# Patient Record
Sex: Female | Born: 1990 | Race: Black or African American | Hispanic: No | Marital: Single | Smoking: Former smoker
Health system: Southern US, Community
[De-identification: ages and names within clinical notes are randomized; demographics above are authoritative.]

## PROBLEM LIST (undated history)

## (undated) ENCOUNTER — Inpatient Hospital Stay (HOSPITAL_COMMUNITY): Payer: Self-pay

## (undated) DIAGNOSIS — F329 Major depressive disorder, single episode, unspecified: Secondary | ICD-10-CM

## (undated) DIAGNOSIS — F32A Depression, unspecified: Secondary | ICD-10-CM

## (undated) DIAGNOSIS — R112 Nausea with vomiting, unspecified: Secondary | ICD-10-CM

## (undated) DIAGNOSIS — Z9889 Other specified postprocedural states: Secondary | ICD-10-CM

## (undated) DIAGNOSIS — M549 Dorsalgia, unspecified: Secondary | ICD-10-CM

## (undated) DIAGNOSIS — F419 Anxiety disorder, unspecified: Secondary | ICD-10-CM

## (undated) DIAGNOSIS — F99 Mental disorder, not otherwise specified: Secondary | ICD-10-CM

## (undated) DIAGNOSIS — B009 Herpesviral infection, unspecified: Secondary | ICD-10-CM

## (undated) DIAGNOSIS — D649 Anemia, unspecified: Secondary | ICD-10-CM

## (undated) DIAGNOSIS — J45909 Unspecified asthma, uncomplicated: Secondary | ICD-10-CM

## (undated) DIAGNOSIS — G43909 Migraine, unspecified, not intractable, without status migrainosus: Secondary | ICD-10-CM

## (undated) HISTORY — DX: Other specified postprocedural states: Z98.890

## (undated) HISTORY — DX: Herpesviral infection, unspecified: B00.9

## (undated) HISTORY — PX: NO PAST SURGERIES: SHX2092

## (undated) HISTORY — DX: Other specified postprocedural states: R11.2

---
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
## 2009-02-22 ENCOUNTER — Emergency Department (HOSPITAL_BASED_OUTPATIENT_CLINIC_OR_DEPARTMENT_OTHER): Admission: EM | Admit: 2009-02-22 | Discharge: 2009-02-22 | Payer: Self-pay | Admitting: Emergency Medicine

## 2009-04-21 ENCOUNTER — Emergency Department (HOSPITAL_BASED_OUTPATIENT_CLINIC_OR_DEPARTMENT_OTHER): Admission: EM | Admit: 2009-04-21 | Discharge: 2009-04-21 | Payer: Self-pay | Admitting: Emergency Medicine

## 2009-06-29 ENCOUNTER — Emergency Department (HOSPITAL_BASED_OUTPATIENT_CLINIC_OR_DEPARTMENT_OTHER): Admission: EM | Admit: 2009-06-29 | Discharge: 2009-06-29 | Payer: Self-pay | Admitting: Emergency Medicine

## 2010-03-31 ENCOUNTER — Emergency Department (HOSPITAL_BASED_OUTPATIENT_CLINIC_OR_DEPARTMENT_OTHER): Admission: EM | Admit: 2010-03-31 | Discharge: 2010-03-31 | Payer: Self-pay | Admitting: Emergency Medicine

## 2010-05-20 ENCOUNTER — Emergency Department (HOSPITAL_BASED_OUTPATIENT_CLINIC_OR_DEPARTMENT_OTHER): Admission: EM | Admit: 2010-05-20 | Discharge: 2010-05-20 | Payer: Self-pay | Admitting: Emergency Medicine

## 2010-09-27 ENCOUNTER — Encounter: Payer: Self-pay | Admitting: Obstetrics and Gynecology

## 2010-12-09 LAB — PREGNANCY, URINE: Preg Test, Ur: NEGATIVE

## 2010-12-09 LAB — URINALYSIS, ROUTINE W REFLEX MICROSCOPIC
Bilirubin Urine: NEGATIVE
Glucose, UA: NEGATIVE mg/dL
Hgb urine dipstick: NEGATIVE
Ketones, ur: NEGATIVE mg/dL
Nitrite: NEGATIVE
Protein, ur: NEGATIVE mg/dL
Specific Gravity, Urine: 1.017 (ref 1.005–1.030)
Urobilinogen, UA: 0.2 mg/dL (ref 0.0–1.0)
pH: 6 (ref 5.0–8.0)

## 2010-12-09 LAB — URINE CULTURE: Colony Count: >=100000

## 2010-12-09 LAB — URINE MICROSCOPIC-ADD ON

## 2010-12-11 LAB — URINALYSIS, ROUTINE W REFLEX MICROSCOPIC
Glucose, UA: NEGATIVE mg/dL
Ketones, ur: NEGATIVE mg/dL
Nitrite: NEGATIVE
Protein, ur: NEGATIVE mg/dL
Urobilinogen, UA: 0.2 mg/dL (ref 0.0–1.0)

## 2010-12-11 LAB — URINE CULTURE
Colony Count: NO GROWTH
Culture: NO GROWTH

## 2010-12-11 LAB — PREGNANCY, URINE: Preg Test, Ur: NEGATIVE

## 2010-12-11 LAB — URINE MICROSCOPIC-ADD ON

## 2011-03-24 ENCOUNTER — Emergency Department (HOSPITAL_BASED_OUTPATIENT_CLINIC_OR_DEPARTMENT_OTHER)
Admission: EM | Admit: 2011-03-24 | Discharge: 2011-03-24 | Disposition: A | Discharge status: Home / Self Care | Payer: Self-pay | Attending: Emergency Medicine | Admitting: Emergency Medicine

## 2011-03-24 DIAGNOSIS — G43909 Migraine, unspecified, not intractable, without status migrainosus: Secondary | ICD-10-CM | POA: Insufficient documentation

## 2011-03-24 DIAGNOSIS — H53149 Visual discomfort, unspecified: Secondary | ICD-10-CM | POA: Insufficient documentation

## 2011-03-24 HISTORY — DX: Migraine, unspecified, not intractable, without status migrainosus: G43.909

## 2011-03-24 MED ORDER — METOCLOPRAMIDE HCL 5 MG/ML IJ SOLN
10.0000 mg | Freq: Once | INTRAMUSCULAR | Status: AC
  Administered 2011-03-24: 10 mg via INTRAVENOUS
  Filled 2011-03-24: qty 2

## 2011-03-24 MED ORDER — SODIUM CHLORIDE 0.9 % IV SOLN: Freq: Once | INTRAVENOUS | Status: DC

## 2011-03-24 MED ORDER — DIPHENHYDRAMINE HCL 50 MG/ML IJ SOLN
25.0000 mg | Freq: Once | INTRAMUSCULAR | Status: AC
  Administered 2011-03-24: 25 mg via INTRAVENOUS
  Filled 2011-03-24: qty 1

## 2011-03-24 MED ORDER — DEXAMETHASONE SODIUM PHOSPHATE 10 MG/ML IJ SOLN
10.0000 mg | Freq: Once | INTRAMUSCULAR | Status: AC
  Administered 2011-03-24: 10 mg via INTRAVENOUS
  Filled 2011-03-24: qty 1

## 2011-03-24 NOTE — ED Notes (Signed)
Onset of headache last night unrelieved after taking Aleve Migraine.  Pain is described a severe, pressure on right side of head.  She has nausea and blurred vision in right eye. Denies head injury.

## 2011-03-24 NOTE — ED Notes (Signed)
MD at bedside. 

## 2011-03-24 NOTE — ED Provider Notes (Signed)
History     Chief Complaint  Patient presents with  . Headache  . Nausea   Patient is a 20 y.o. female presenting with migraine. The history is provided by the patient.  Migraine This is a recurrent problem. The current episode started yesterday. The problem occurs constantly. The problem has been gradually worsening. Associated symptoms include headaches. The symptoms are aggravated by stress (bright lights). The symptoms are relieved by NSAIDs. She has tried acetaminophen for the symptoms. The treatment provided no relief.  Pain over the right side of face which is usually where headaches are  Past Medical History  Diagnosis Date  . Migraines     No past surgical history on file.  No family history on file.  History  Substance Use Topics  . Smoking status: Former Games developer  . Smokeless tobacco: Not on file  . Alcohol Use: No    OB History    Grav Para Term Preterm Abortions TAB SAB Ect Mult Living                  Review of Systems  Neurological: Positive for headaches. Negative for dizziness, facial asymmetry, weakness and numbness.  All other systems reviewed and are negative.    Physical Exam  BP 105/58  Pulse 51  Temp(Src) 98.1 F (36.7 C) (Oral)  Resp 20  Ht 5\' 5"  (1.651 m)  Wt 119 lb (53.978 kg)  BMI 19.80 kg/m2  SpO2 100%  LMP 03/19/2011  Physical Exam  Nursing note and vitals reviewed. Constitutional: She is oriented to person, place, and time. She appears well-developed and well-nourished. She appears distressed.  HENT:  Head: Normocephalic and atraumatic.  Eyes: EOM are normal. Pupils are equal, round, and reactive to light.  Fundoscopic exam:      The right eye shows no papilledema.       The left eye shows no papilledema.  Neck: Normal range of motion. Neck supple.  Cardiovascular: Normal rate, regular rhythm, normal heart sounds and intact distal pulses.  Exam reveals no friction rub.   No murmur heard. Pulmonary/Chest: Effort normal and  breath sounds normal. She has no wheezes. She has no rales.  Abdominal: Soft. Bowel sounds are normal. She exhibits no distension. There is no tenderness. There is no rebound and no guarding.  Musculoskeletal: Normal range of motion. She exhibits no tenderness.       No edema  Lymphadenopathy:    She has no cervical adenopathy.  Neurological: She is alert and oriented to person, place, and time. She has normal strength. No cranial nerve deficit or sensory deficit. Gait normal.       photophobia  Skin: Skin is warm and dry. No rash noted.  Psychiatric: She has a normal mood and affect. Her behavior is normal.    ED Course  Procedures  MDM Pt with typical migraine HA without sx suggestive of SAH(sudden onset, worst of life, or deficits), infection, or cavernous vein thrombosis.  Normal neuro exam and vital signs. Will give HA cocktail and on re-eval pt feeling much better.       Gwyneth Sprout, MD 03/24/11 1559

## 2011-09-13 ENCOUNTER — Other Ambulatory Visit (HOSPITAL_COMMUNITY)
Admission: RE | Admit: 2011-09-13 | Discharge: 2011-09-13 | Disposition: A | Discharge status: Home / Self Care | Payer: BC Managed Care – PPO | Source: Ambulatory Visit | Attending: Obstetrics and Gynecology | Admitting: Obstetrics and Gynecology

## 2011-09-13 DIAGNOSIS — Z113 Encounter for screening for infections with a predominantly sexual mode of transmission: Secondary | ICD-10-CM | POA: Insufficient documentation

## 2011-09-13 DIAGNOSIS — Z124 Encounter for screening for malignant neoplasm of cervix: Secondary | ICD-10-CM | POA: Insufficient documentation

## 2012-06-09 ENCOUNTER — Emergency Department (HOSPITAL_BASED_OUTPATIENT_CLINIC_OR_DEPARTMENT_OTHER)
Admission: EM | Admit: 2012-06-09 | Discharge: 2012-06-10 | Disposition: A | Discharge status: Home / Self Care | Payer: Self-pay | Attending: Emergency Medicine | Admitting: Emergency Medicine

## 2012-06-09 ENCOUNTER — Encounter (HOSPITAL_BASED_OUTPATIENT_CLINIC_OR_DEPARTMENT_OTHER): Payer: Self-pay | Admitting: Emergency Medicine

## 2012-06-09 DIAGNOSIS — R109 Unspecified abdominal pain: Secondary | ICD-10-CM | POA: Insufficient documentation

## 2012-06-09 LAB — COMPREHENSIVE METABOLIC PANEL
Alkaline Phosphatase: 58 U/L (ref 39–117)
BUN: 8 mg/dL (ref 6–23)
CO2: 26 mEq/L (ref 19–32)
Chloride: 105 mEq/L (ref 96–112)
GFR calc Af Amer: >90 mL/min (ref >90)
Glucose, Bld: 82 mg/dL (ref 70–99)
Potassium: 4.3 mEq/L (ref 3.5–5.1)
Total Bilirubin: 0.2 mg/dL — ABNORMAL LOW (ref 0.3–1.2)

## 2012-06-09 LAB — URINE MICROSCOPIC-ADD ON

## 2012-06-09 LAB — URINALYSIS, ROUTINE W REFLEX MICROSCOPIC
Bilirubin Urine: NEGATIVE
Ketones, ur: NEGATIVE mg/dL
Nitrite: NEGATIVE
Protein, ur: NEGATIVE mg/dL
pH: 7 (ref 5.0–8.0)

## 2012-06-09 LAB — CBC WITH DIFFERENTIAL/PLATELET
Hemoglobin: 12.2 g/dL (ref 12.0–15.0)
Lymphs Abs: 2.5 10*3/uL (ref 0.7–4.0)
MCH: 27.2 pg (ref 26.0–34.0)
Monocytes Relative: 8 % (ref 3–12)
Neutro Abs: 1.4 10*3/uL — ABNORMAL LOW (ref 1.7–7.7)
Neutrophils Relative %: 30 % — ABNORMAL LOW (ref 43–77)
RBC: 4.48 MIL/uL (ref 3.87–5.11)

## 2012-06-09 LAB — PREGNANCY, URINE: Preg Test, Ur: NEGATIVE

## 2012-06-09 MED ORDER — HYDROMORPHONE HCL PF 1 MG/ML IJ SOLN
1.0000 mg | Freq: Once | INTRAMUSCULAR | Status: AC
  Administered 2012-06-09: 1 mg via INTRAVENOUS
  Filled 2012-06-09: qty 1

## 2012-06-09 MED ORDER — SODIUM CHLORIDE 0.9 % IV BOLUS (SEPSIS)
1000.0000 mL | Freq: Once | INTRAVENOUS | Status: AC
  Administered 2012-06-09: 1000 mL via INTRAVENOUS

## 2012-06-09 MED ORDER — SODIUM CHLORIDE 0.9 % IV SOLN
Freq: Once | INTRAVENOUS | Status: AC
  Administered 2012-06-09: via INTRAVENOUS

## 2012-06-09 MED ORDER — ONDANSETRON HCL 4 MG/2ML IJ SOLN
4.0000 mg | Freq: Once | INTRAMUSCULAR | Status: AC
  Administered 2012-06-09: 4 mg via INTRAVENOUS
  Filled 2012-06-09: qty 2

## 2012-06-09 NOTE — ED Notes (Signed)
Pt. C/o of increased abd pain. Rates 9/10 Dr. Preston Fleeting aware.

## 2012-06-09 NOTE — ED Notes (Signed)
abd pain   Was seen yesterday in [facility] for same

## 2012-06-09 NOTE — ED Provider Notes (Signed)
History   This chart was scribed for Dione Booze, MD by Toya Smothers. The patient was seen in room MH06/MH06. Patient's care was started at 2006.  CSN: [id number]  Arrival date & time 06/09/12  2006   First MD Initiated Contact with Patient 06/09/12 2201      Chief Complaint  Patient presents with  . Abdominal Pain   The history is provided by the patient. No language interpreter was used.    Julia Morgan is a 20 y.o. female who presents to the Emergency Department complaining of 2 days of sudden onset new severe waxing and waning lower abdominal pain. Pain is described as sharp, aggravated by gaiting, radiating to the back, and is ranked as 10/10. She reports being evaluated at an ED in [facility], and was told to return if pain worsened. Pt has been taking Hydrocodone with mild relief, stating pain subsided and returned. Pt denotes today is the last day of her menstrual cycle, and that her cycle has been 4 days late. Pt denies fever, chills, emesis, rash, and cough.   Past Medical History  Diagnosis Date  . Migraines     History reviewed. No pertinent past surgical history.  No family history on file.  History  Substance Use Topics  . Smoking status: Former Games developer  . Smokeless tobacco: Not on file  . Alcohol Use: No    Review of Systems  Gastrointestinal: Positive for nausea and abdominal pain.  Genitourinary: Negative for flank pain, decreased urine volume and difficulty urinating.  All other systems reviewed and are negative.    Allergies  Review of patient's allergies indicates no known allergies.  Home Medications  No current outpatient prescriptions on file.  BP 102/57  Pulse 61  Temp 98.5 F (36.9 C) (Oral)  Resp 22  SpO2 100%  Physical Exam  Nursing note and vitals reviewed. Constitutional: She is oriented to person, place, and time. She appears well-developed and well-nourished. No distress.       Appears to be in pain raithing on the bed.    HENT:  Head: Normocephalic and atraumatic.  Mouth/Throat: No oropharyngeal exudate.  Eyes: EOM are normal. Pupils are equal, round, and reactive to light. No scleral icterus.  Neck: Normal range of motion. Neck supple. No tracheal deviation present.  Pulmonary/Chest: Effort normal and breath sounds normal. No respiratory distress.  Abdominal: Soft.       Moderate LLQ. Mild CVA tenderness. Bowel sounds decresaed  Musculoskeletal: Normal range of motion. She exhibits no edema and no tenderness.  Lymphadenopathy:    She has no cervical adenopathy.  Neurological: She is alert and oriented to person, place, and time. Coordination normal.  Skin: Skin is warm and dry. No rash noted.  Psychiatric: She has a normal mood and affect. Her behavior is normal. Judgment and thought content normal.    ED Course  Procedures  DIAGNOSTIC STUDIES: Oxygen Saturation is 100% on room air, normal by my interpretation.    COORDINATION OF CARE: 22:27- Evaluated Pt. PT is awake, alert, and  22:31- Patient informed of clinical course, understand medical decision-making process, and agree with plan.      Results for orders placed during the hospital encounter of 06/09/12  PREGNANCY, URINE      Component Value Range   Preg Test, Ur NEGATIVE  NEGATIVE  URINALYSIS, ROUTINE W REFLEX MICROSCOPIC      Component Value Range   Color, Urine YELLOW  YELLOW   APPearance CLOUDY (*) CLEAR  Specific Gravity, Urine 1.020  1.005 - 1.030   pH 7.0  5.0 - 8.0   Glucose, UA NEGATIVE  NEGATIVE mg/dL   Hgb urine dipstick NEGATIVE  NEGATIVE   Bilirubin Urine NEGATIVE  NEGATIVE   Ketones, ur NEGATIVE  NEGATIVE mg/dL   Protein, ur NEGATIVE  NEGATIVE mg/dL   Urobilinogen, UA 1.0  0.0 - 1.0 mg/dL   Nitrite NEGATIVE  NEGATIVE   Leukocytes, UA SMALL (*) NEGATIVE  URINE MICROSCOPIC-ADD ON      Component Value Range   Squamous Epithelial / LPF FEW (*) RARE   WBC, UA 7-10  <3 WBC/hpf   RBC / HPF 0-2  <3 RBC/hpf    Bacteria, UA MANY (*) RARE  CBC WITH DIFFERENTIAL      Component Value Range   WBC 4.7  4.0 - 10.5 K/uL   RBC 4.48  3.87 - 5.11 MIL/uL   Hemoglobin 12.2  12.0 - 15.0 g/dL   HCT 16.1 (*) 09.6 - 04.5 %   MCV 74.3 (*) 78.0 - 100.0 fL   MCH 27.2  26.0 - 34.0 pg   MCHC 36.6 (*) 30.0 - 36.0 g/dL   RDW 40.9  81.1 - 91.4 %   Platelets 239  150 - 400 K/uL   Neutrophils Relative 30 (*) 43 - 77 %   Neutro Abs 1.4 (*) 1.7 - 7.7 K/uL   Lymphocytes Relative 54 (*) 12 - 46 %   Lymphs Abs 2.5  0.7 - 4.0 K/uL   Monocytes Relative 8  3 - 12 %   Monocytes Absolute 0.4  0.1 - 1.0 K/uL   Eosinophils Relative 9 (*) 0 - 5 %   Eosinophils Absolute 0.4  0.0 - 0.7 K/uL   Basophils Relative 0  0 - 1 %   Basophils Absolute 0.0  0.0 - 0.1 K/uL  COMPREHENSIVE METABOLIC PANEL      Component Value Range   Sodium 139  135 - 145 mEq/L   Potassium 4.3  3.5 - 5.1 mEq/L   Chloride 105  96 - 112 mEq/L   CO2 26  19 - 32 mEq/L   Glucose, Bld 82  70 - 99 mg/dL   BUN 8  6 - 23 mg/dL   Creatinine, Ser 7.82  0.50 - 1.10 mg/dL   Calcium 9.1  8.4 - 95.6 mg/dL   Total Protein 6.6  6.0 - 8.3 g/dL   Albumin 3.8  3.5 - 5.2 g/dL   AST 18  0 - 37 U/L   ALT 8  0 - 35 U/L   Alkaline Phosphatase 58  39 - 117 U/L   Total Bilirubin 0.2 (*) 0.3 - 1.2 mg/dL   GFR calc non Af Amer >90  >90 mL/min   GFR calc Af Amer >90  >90 mL/min    1. Abdominal pain       MDM  Abdominal pain of uncertain cause. Test results were obtained from her ED visit at [facility] yesterday. She had an essentially negative CT of her abdomen and pelvis, normal CBC with WBC of 5.1, and normal metabolic panel. Urinalysis was normal. Labs will be repeated today. No indication to repeat CT scan. She needs a CT of her pelvis for completeness. She'll be given IV fluid and IV narcotics in the emergency department.  Repeat lab testing is unremarkable. Pelvic ultrasound will be obtained tomorrow and she is sent home with prescription for Percocet. If  ultrasound is negative, she will need referral to GI  for further evaluation of her pain.    I personally performed the services described in this documentation, which was scribed in my presence. The recorded information has been reviewed and considered.       Dione Booze, MD 06/10/12 [phone]

## 2012-06-10 ENCOUNTER — Ambulatory Visit (HOSPITAL_BASED_OUTPATIENT_CLINIC_OR_DEPARTMENT_OTHER)
Admit: 2012-06-10 | Discharge: 2012-06-10 | Disposition: A | Discharge status: Home / Self Care | Payer: Self-pay | Attending: Emergency Medicine | Admitting: Emergency Medicine

## 2012-06-10 DIAGNOSIS — R1032 Left lower quadrant pain: Secondary | ICD-10-CM | POA: Insufficient documentation

## 2012-06-10 MED ORDER — OXYCODONE-ACETAMINOPHEN 5-325 MG PO TABS: 1.0000 | ORAL_TABLET | ORAL | Status: DC | PRN

## 2012-12-06 ENCOUNTER — Encounter (HOSPITAL_BASED_OUTPATIENT_CLINIC_OR_DEPARTMENT_OTHER): Payer: Self-pay

## 2012-12-06 ENCOUNTER — Emergency Department (HOSPITAL_BASED_OUTPATIENT_CLINIC_OR_DEPARTMENT_OTHER): Payer: No Typology Code available for payment source

## 2012-12-06 ENCOUNTER — Emergency Department (HOSPITAL_BASED_OUTPATIENT_CLINIC_OR_DEPARTMENT_OTHER)
Admission: EM | Admit: 2012-12-06 | Discharge: 2012-12-06 | Disposition: A | Discharge status: Home / Self Care | Payer: No Typology Code available for payment source | Attending: Emergency Medicine | Admitting: Emergency Medicine

## 2012-12-06 DIAGNOSIS — Z87891 Personal history of nicotine dependence: Secondary | ICD-10-CM | POA: Insufficient documentation

## 2012-12-06 DIAGNOSIS — S3981XA Other specified injuries of abdomen, initial encounter: Secondary | ICD-10-CM | POA: Insufficient documentation

## 2012-12-06 DIAGNOSIS — R109 Unspecified abdominal pain: Secondary | ICD-10-CM

## 2012-12-06 DIAGNOSIS — S79929A Unspecified injury of unspecified thigh, initial encounter: Secondary | ICD-10-CM | POA: Insufficient documentation

## 2012-12-06 DIAGNOSIS — S79919A Unspecified injury of unspecified hip, initial encounter: Secondary | ICD-10-CM | POA: Insufficient documentation

## 2012-12-06 DIAGNOSIS — Y9389 Activity, other specified: Secondary | ICD-10-CM | POA: Insufficient documentation

## 2012-12-06 DIAGNOSIS — Y92410 Unspecified street and highway as the place of occurrence of the external cause: Secondary | ICD-10-CM | POA: Insufficient documentation

## 2012-12-06 DIAGNOSIS — Z8679 Personal history of other diseases of the circulatory system: Secondary | ICD-10-CM | POA: Insufficient documentation

## 2012-12-06 LAB — CBC WITH DIFFERENTIAL/PLATELET
Basophils Absolute: 0 10*3/uL (ref 0.0–0.1)
Eosinophils Relative: 2 % (ref 0–5)
Lymphocytes Relative: 31 % (ref 12–46)
MCV: 75.2 fL — ABNORMAL LOW (ref 78.0–100.0)
Neutro Abs: 3.2 10*3/uL (ref 1.7–7.7)
Platelets: 263 10*3/uL (ref 150–400)
RDW: 12.1 % (ref 11.5–15.5)
WBC: 5.3 10*3/uL (ref 4.0–10.5)

## 2012-12-06 LAB — COMPREHENSIVE METABOLIC PANEL
ALT: 8 U/L (ref 0–35)
AST: 13 U/L (ref 0–37)
Albumin: 4.1 g/dL (ref 3.5–5.2)
CO2: 24 mEq/L (ref 19–32)
Calcium: 9.2 mg/dL (ref 8.4–10.5)
GFR calc non Af Amer: >90 mL/min (ref >90)
Sodium: 139 mEq/L (ref 135–145)

## 2012-12-06 MED ORDER — HYDROCODONE-ACETAMINOPHEN 5-325 MG PO TABS: 2.0000 | ORAL_TABLET | ORAL | Status: DC | PRN

## 2012-12-06 MED ORDER — IOHEXOL 300 MG/ML  SOLN
100.0000 mL | Freq: Once | INTRAMUSCULAR | Status: AC | PRN
  Administered 2012-12-06: 100 mL via INTRAVENOUS

## 2012-12-06 MED ORDER — SODIUM CHLORIDE 0.9 % IV SOLN: Freq: Once | INTRAVENOUS | Status: DC

## 2012-12-06 NOTE — ED Provider Notes (Signed)
Medical screening examination/treatment/procedure(s) were performed by non-physician practitioner and as supervising physician I was immediately available for consultation/collaboration.   Gwyneth Sprout, MD 12/07/12 0000
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
## 2013-06-14 ENCOUNTER — Emergency Department (HOSPITAL_BASED_OUTPATIENT_CLINIC_OR_DEPARTMENT_OTHER)
Admission: EM | Admit: 2013-06-14 | Discharge: 2013-06-14 | Disposition: A | Discharge status: Home / Self Care | Payer: BC Managed Care – PPO | Attending: Emergency Medicine | Admitting: Emergency Medicine

## 2013-06-14 ENCOUNTER — Encounter (HOSPITAL_BASED_OUTPATIENT_CLINIC_OR_DEPARTMENT_OTHER): Payer: Self-pay | Admitting: Emergency Medicine

## 2013-06-14 DIAGNOSIS — R599 Enlarged lymph nodes, unspecified: Secondary | ICD-10-CM | POA: Insufficient documentation

## 2013-06-14 DIAGNOSIS — Z87891 Personal history of nicotine dependence: Secondary | ICD-10-CM | POA: Insufficient documentation

## 2013-06-14 DIAGNOSIS — G43909 Migraine, unspecified, not intractable, without status migrainosus: Secondary | ICD-10-CM | POA: Insufficient documentation

## 2013-06-14 DIAGNOSIS — E669 Obesity, unspecified: Secondary | ICD-10-CM | POA: Insufficient documentation

## 2013-06-14 MED ORDER — PROMETHAZINE HCL 25 MG/ML IJ SOLN
25.0000 mg | Freq: Once | INTRAMUSCULAR | Status: AC
  Administered 2013-06-14: 25 mg via INTRAVENOUS
  Filled 2013-06-14: qty 1

## 2013-06-14 MED ORDER — SODIUM CHLORIDE 0.9 % IV BOLUS (SEPSIS)
1000.0000 mL | Freq: Once | INTRAVENOUS | Status: AC
  Administered 2013-06-14: 1000 mL via INTRAVENOUS

## 2013-06-14 MED ORDER — DIPHENHYDRAMINE HCL 50 MG/ML IJ SOLN
25.0000 mg | Freq: Once | INTRAMUSCULAR | Status: AC
  Administered 2013-06-14: 25 mg via INTRAVENOUS
  Filled 2013-06-14: qty 1

## 2013-06-14 MED ORDER — OXYCODONE-ACETAMINOPHEN 5-325 MG PO TABS: 1.0000 | ORAL_TABLET | ORAL | Status: DC | PRN

## 2013-06-14 MED ORDER — KETOROLAC TROMETHAMINE 30 MG/ML IJ SOLN
30.0000 mg | Freq: Once | INTRAMUSCULAR | Status: AC
  Administered 2013-06-14: 30 mg via INTRAVENOUS
  Filled 2013-06-14: qty 1

## 2013-06-14 MED ORDER — ONDANSETRON 8 MG PO TBDP: 8.0000 mg | ORAL_TABLET | Freq: Three times a day (TID) | ORAL | Status: DC | PRN

## 2013-06-14 MED ORDER — METHYLPREDNISOLONE SODIUM SUCC 125 MG IJ SOLR
125.0000 mg | Freq: Once | INTRAMUSCULAR | Status: AC
  Administered 2013-06-14: 125 mg via INTRAVENOUS
  Filled 2013-06-14: qty 2

## 2013-06-14 NOTE — ED Provider Notes (Addendum)
CSN: [id number]     Arrival date & time 06/14/13  0809 History   First MD Initiated Contact with Patient 06/14/13 [phone]     Chief Complaint  Patient presents with  . Migraine   (Consider location/radiation/quality/duration/timing/severity/associated sxs/prior Treatment) HPI Comments: Patient with migraine like her usual migraine beegan yesterday.  Usual meds such as ibuprofen without relief.  Nausea and vomting- vomited after excedrin this am.  Denies visual changes, fever, neck stiffness, sudden onset. She takes prophylaxis and still has headaches weekly but usually able to control with otc pain medicine.  Last required visit to hospital last year. Denies pregnancy.   The history is provided by the patient.    Past Medical History  Diagnosis Date  . Migraines    History reviewed. No pertinent past surgical history. History reviewed. No pertinent family history. History  Substance Use Topics  . Smoking status: Former Games developer  . Smokeless tobacco: Not on file  . Alcohol Use: No   OB History   Grav Para Term Preterm Abortions TAB SAB Ect Mult Living                 Review of Systems  All other systems reviewed and are negative.    Allergies  Review of patient's allergies indicates no known allergies.  Home Medications   Current Outpatient Rx  Name  Route  Sig  Dispense  Refill  . HYDROcodone-acetaminophen (NORCO/VICODIN) 5-325 MG per tablet   Oral   Take 2 tablets by mouth every 4 (four) hours as needed for pain.   10 tablet   0   . oxyCODONE-acetaminophen (PERCOCET/ROXICET) 5-325 MG per tablet   Oral   Take 1-2 tablets by mouth every 4 (four) hours as needed for pain.   20 tablet   0    BP 121/78  Pulse 52  Temp(Src) 97.6 F (36.4 C) (Oral)  Resp 22  Ht 5\' 5"  (1.651 m)  SpO2 99%  LMP 06/03/2013 Physical Exam  Nursing note and vitals reviewed. Constitutional: She is oriented to person, place, and time. She appears well-developed and well-nourished.   Obese   HENT:  Head: Normocephalic and atraumatic.  Right Ear: Tympanic membrane and external ear normal.  Left Ear: Tympanic membrane and external ear normal.  Nose: Nose normal. Right sinus exhibits no maxillary sinus tenderness and no frontal sinus tenderness. Left sinus exhibits no maxillary sinus tenderness and no frontal sinus tenderness.  Mouth/Throat: Oropharynx is clear and moist.  Eyes: Conjunctivae and EOM are normal. Pupils are equal, round, and reactive to light. Right eye exhibits no nystagmus. Left eye exhibits no nystagmus.  Neck: Normal range of motion. Neck supple. No tracheal deviation present.  Cardiovascular: Normal rate, regular rhythm, normal heart sounds and intact distal pulses.   Pulmonary/Chest: Effort normal and breath sounds normal. No respiratory distress. She exhibits no tenderness.  Abdominal: Soft. Bowel sounds are normal. She exhibits no distension and no mass. There is no tenderness.  Musculoskeletal: Normal range of motion. She exhibits no edema and no tenderness.  Lymphadenopathy:    She has no cervical adenopathy.  Neurological: She is alert and oriented to person, place, and time. She has normal strength and normal reflexes. No sensory deficit. She displays a negative Romberg sign. GCS eye subscore is 4. GCS verbal subscore is 5. GCS motor subscore is 6.  Reflex Scores:      Tricep reflexes are 2+ on the right side and 2+ on the left side.  Bicep reflexes are 2+ on the right side and 2+ on the left side.      Brachioradialis reflexes are 2+ on the right side and 2+ on the left side.      Patellar reflexes are 2+ on the right side and 2+ on the left side.      Achilles reflexes are 2+ on the right side and 2+ on the left side. Patient with normal gait without ataxia, shuffling, spasm, or antalgia. Speech is normal without dysarthria, dysphasia, or aphasia. Muscle strength is 5/5 in bilateral shoulders, elbow flexor and extensors, wrist flexor and  extensors, and intrinsic hand muscles. 5/5 bilateral lower extremity hip flexors, extensors, knee flexors and extensors, and ankle dorsi and plantar flexors.    Skin: Skin is warm and dry. No rash noted.  Psychiatric: She has a normal mood and affect. Her behavior is normal. Judgment and thought content normal.    ED Course  Procedures (including critical care time) Labs Review Labs Reviewed - No data to display Imaging Review No results found.  EKG Interpretation   None     Patient given iv fluids, benadryl, phenergan and solumedrol, and toradol and states headache much better  10:09 AM   MDM  No diagnosis found. Migraine headache.  Hilario Quarry, MD 06/14/13 1610  Hilario Quarry, MD 06/14/13 1010

## 2013-06-14 NOTE — ED Notes (Signed)
Pt amb to room 1 with quick steady gait, pt is teary, states she has had migraines since she was a child, and had onset of her usual migraine pain last night, vomiting and photophobia this am.

## 2013-08-21 ENCOUNTER — Emergency Department (HOSPITAL_BASED_OUTPATIENT_CLINIC_OR_DEPARTMENT_OTHER)
Admission: EM | Admit: 2013-08-21 | Discharge: 2013-08-21 | Disposition: A | Discharge status: Home / Self Care | Payer: BC Managed Care – PPO | Attending: Emergency Medicine | Admitting: Emergency Medicine

## 2013-08-21 ENCOUNTER — Emergency Department (HOSPITAL_BASED_OUTPATIENT_CLINIC_OR_DEPARTMENT_OTHER): Payer: BC Managed Care – PPO

## 2013-08-21 ENCOUNTER — Encounter (HOSPITAL_BASED_OUTPATIENT_CLINIC_OR_DEPARTMENT_OTHER): Payer: Self-pay | Admitting: Emergency Medicine

## 2013-08-21 DIAGNOSIS — R071 Chest pain on breathing: Secondary | ICD-10-CM | POA: Insufficient documentation

## 2013-08-21 DIAGNOSIS — M549 Dorsalgia, unspecified: Secondary | ICD-10-CM | POA: Insufficient documentation

## 2013-08-21 DIAGNOSIS — Z8679 Personal history of other diseases of the circulatory system: Secondary | ICD-10-CM | POA: Insufficient documentation

## 2013-08-21 DIAGNOSIS — Z87891 Personal history of nicotine dependence: Secondary | ICD-10-CM | POA: Insufficient documentation

## 2013-08-21 DIAGNOSIS — R0789 Other chest pain: Secondary | ICD-10-CM

## 2013-08-21 HISTORY — DX: Dorsalgia, unspecified: M54.9

## 2013-08-21 LAB — D-DIMER, QUANTITATIVE: D-Dimer, Quant: 0.35 ug/mL-FEU (ref 0.00–0.48)

## 2013-08-21 LAB — COMPREHENSIVE METABOLIC PANEL
AST: 16 U/L (ref 0–37)
Albumin: 4.3 g/dL (ref 3.5–5.2)
Alkaline Phosphatase: 65 U/L (ref 39–117)
BUN: 12 mg/dL (ref 6–23)
CO2: 26 mEq/L (ref 19–32)
Chloride: 101 mEq/L (ref 96–112)
Creatinine, Ser: 0.7 mg/dL (ref 0.50–1.10)
GFR calc non Af Amer: >90 mL/min (ref >90)
Glucose, Bld: 100 mg/dL — ABNORMAL HIGH (ref 70–99)
Potassium: 3.6 mEq/L (ref 3.5–5.1)
Total Bilirubin: 0.2 mg/dL — ABNORMAL LOW (ref 0.3–1.2)

## 2013-08-21 LAB — CBC WITH DIFFERENTIAL/PLATELET
Basophils Relative: 0 % (ref 0–1)
Eosinophils Absolute: 0.4 10*3/uL (ref 0.0–0.7)
HCT: 35.9 % — ABNORMAL LOW (ref 36.0–46.0)
Hemoglobin: 12.8 g/dL (ref 12.0–15.0)
Lymphocytes Relative: 41 % (ref 12–46)
MCH: 27.4 pg (ref 26.0–34.0)
MCHC: 35.7 g/dL (ref 30.0–36.0)
Monocytes Absolute: 0.6 10*3/uL (ref 0.1–1.0)
Monocytes Relative: 7 % (ref 3–12)
Neutro Abs: 3.5 10*3/uL (ref 1.7–7.7)
Neutrophils Relative %: 46 % (ref 43–77)
RBC: 4.68 MIL/uL (ref 3.87–5.11)
WBC: 7.5 10*3/uL (ref 4.0–10.5)

## 2013-08-21 MED ORDER — HYDROCODONE-ACETAMINOPHEN 5-325 MG PO TABS: 2.0000 | ORAL_TABLET | ORAL | Status: DC | PRN

## 2013-08-21 MED ORDER — KETOROLAC TROMETHAMINE 60 MG/2ML IM SOLN
60.0000 mg | Freq: Once | INTRAMUSCULAR | Status: AC
  Administered 2013-08-21: 60 mg via INTRAMUSCULAR
  Filled 2013-08-21: qty 2

## 2013-08-21 NOTE — ED Notes (Signed)
Pt reports 3 days of pain under left breast. Pain worse "when i lie on my left side to sleep". Also worse with yawning, laughing or coughing

## 2013-08-21 NOTE — ED Provider Notes (Signed)
CSN: [id number]     Arrival date & time 08/21/13  2111 History  This chart was scribed for Geoffery Lyons, MD by Landis Gandy, ED Scribe. This patient was seen in room MH10/MH10 and the patient's care was started at 9:40 PM     Chief Complaint  Patient presents with  . Chest Pain    The history is provided by the patient. No language interpreter was used.   HPI Comments: Julia Morgan is a 22 y.o. female who presents to the Emergency Department complaining of new, constant, unchanged, sharp left sided chest pain, onset three days ago. She denies any injuries, traumas, and falls. She reports that it is worsened with laughing, coughing, yawning, and laying on her left side. She also complains of pain in her back and difficulty sleeping. She denies past similar symptoms. She denies any fever, cough, abdominal pain, leg swelling, cough, congestion or any other URI symptoms.  She denies smoking. She also denies taking any Birth Control.   Past Medical History  Diagnosis Date  . Migraines   . Back pain    History reviewed. No pertinent past surgical history. No family history on file. History  Substance Use Topics  . Smoking status: Former Games developer  . Smokeless tobacco: Never Used  . Alcohol Use: Yes     Comment: occasional   OB History   Grav Para Term Preterm Abortions TAB SAB Ect Mult Living                 Review of Systems A complete 10 system review of systems was obtained and all systems are negative except as noted in the HPI and PMH.    Allergies  Review of patient's allergies indicates no known allergies.  Home Medications   Current Outpatient Rx  Name  Route  Sig  Dispense  Refill  . HYDROcodone-acetaminophen (NORCO/VICODIN) 5-325 MG per tablet   Oral   Take 2 tablets by mouth every 4 (four) hours as needed for pain.   10 tablet   0   . ondansetron (ZOFRAN ODT) 8 MG disintegrating tablet   Oral   Take 1 tablet (8 mg total) by mouth every 8 (eight) hours as  needed for nausea.   20 tablet   0   . oxyCODONE-acetaminophen (PERCOCET/ROXICET) 5-325 MG per tablet   Oral   Take 1-2 tablets by mouth every 4 (four) hours as needed for pain.   20 tablet   0   . oxyCODONE-acetaminophen (PERCOCET/ROXICET) 5-325 MG per tablet   Oral   Take 1 tablet by mouth every 4 (four) hours as needed for pain.   6 tablet   0    LMP 08/08/2013 Physical Exam  Nursing note and vitals reviewed. Constitutional: She is oriented to person, place, and time. She appears well-developed and well-nourished. No distress.  HENT:  Head: Normocephalic and atraumatic.  Eyes: Conjunctivae and EOM are normal. No scleral icterus.  Neck: Normal range of motion.  Pulmonary/Chest: Effort normal. No respiratory distress.  Musculoskeletal: Normal range of motion.  There is no LE swelling. There is no calf tenderness. Homan sign is negative bilaterally.   Neurological: She is alert and oriented to person, place, and time.  Skin: Skin is warm and dry. No rash noted. She is not diaphoretic. No erythema. No pallor.  Psychiatric: She has a normal mood and affect. Her behavior is normal.    ED Course  Procedures (including critical care time) DIAGNOSTIC STUDIES: None performed.  COORDINATION OF CARE: 9:44 PM- CXR ordered. Pt advised of plan for treatment and pt agrees.  Labs Review Labs Reviewed - No data to display Imaging Review No results found.  EKG Interpretation    Date/Time:  Wednesday August 21 2013 21:29:18 EST Ventricular Rate:  56 PR Interval:  136 QRS Duration: 84 QT Interval:  410 QTC Calculation: 395 R Axis:   90 Text Interpretation:  Sinus bradycardia Rightward axis Borderline ECG Confirmed by DELOS  MD, Tobechukwu Emmick ([id number]) on 08/21/2013 10:27:08 PM            MDM  No diagnosis found. Patient is a 22 year old female who presents with pain in the left chest that has been ongoing for several days. She denies any injury or trauma. She denies any  shortness of breath. She states that it is most uncomfortable when laughing, sneezing, coughing, and sleeping on her left side.  Physical examination and vital signs are unremarkable and workup does not reveal any acute process. Her EKG is normal and chest x-ray is clear. Her d-dimer is negative thus ruling out pulmonary embolism in this setting. Her symptoms are most likely musculoskeletal in nature but could also be pleurisy. Either way she will be treated with nonsteroidals and pain medication and advised to followup or return if not improving or if she worsens.    I personally performed the services described in this documentation, which was scribed in my presence. The recorded information has been reviewed and is accurate.       Geoffery Lyons, MD 08/21/13 2251

## 2014-06-04 ENCOUNTER — Encounter (HOSPITAL_BASED_OUTPATIENT_CLINIC_OR_DEPARTMENT_OTHER): Payer: Self-pay | Admitting: Emergency Medicine

## 2014-06-04 ENCOUNTER — Emergency Department (HOSPITAL_BASED_OUTPATIENT_CLINIC_OR_DEPARTMENT_OTHER)
Admission: EM | Admit: 2014-06-04 | Discharge: 2014-06-04 | Disposition: A | Discharge status: Home / Self Care | Payer: BC Managed Care – PPO | Attending: Emergency Medicine | Admitting: Emergency Medicine

## 2014-06-04 DIAGNOSIS — IMO0001 Reserved for inherently not codable concepts without codable children: Secondary | ICD-10-CM | POA: Insufficient documentation

## 2014-06-04 DIAGNOSIS — Z8679 Personal history of other diseases of the circulatory system: Secondary | ICD-10-CM | POA: Insufficient documentation

## 2014-06-04 DIAGNOSIS — R6883 Chills (without fever): Secondary | ICD-10-CM | POA: Insufficient documentation

## 2014-06-04 DIAGNOSIS — J45909 Unspecified asthma, uncomplicated: Secondary | ICD-10-CM | POA: Insufficient documentation

## 2014-06-04 DIAGNOSIS — R1110 Vomiting, unspecified: Secondary | ICD-10-CM | POA: Insufficient documentation

## 2014-06-04 DIAGNOSIS — R197 Diarrhea, unspecified: Secondary | ICD-10-CM | POA: Insufficient documentation

## 2014-06-04 DIAGNOSIS — Z87891 Personal history of nicotine dependence: Secondary | ICD-10-CM | POA: Insufficient documentation

## 2014-06-04 HISTORY — DX: Unspecified asthma, uncomplicated: J45.909

## 2014-06-04 MED ORDER — ONDANSETRON HCL 8 MG PO TABS: 8.0000 mg | ORAL_TABLET | Freq: Three times a day (TID) | ORAL | Status: DC | PRN

## 2014-06-04 MED ORDER — ONDANSETRON 8 MG PO TBDP
8.0000 mg | ORAL_TABLET | Freq: Once | ORAL | Status: AC
  Administered 2014-06-04: 8 mg via ORAL
  Filled 2014-06-04: qty 1

## 2014-06-04 NOTE — ED Notes (Signed)
Patient states she woke this morning at 0330 with vomiting x 5 and diarrhea x 2.  Denies pain.

## 2014-06-04 NOTE — ED Provider Notes (Signed)
CSN: [id number]     Arrival date & time 06/04/14  [id number] History   First MD Initiated Contact with Patient 06/04/14 [id number]     Chief Complaint  Patient presents with  . Emesis     Patient is a 23 y.o. female presenting with vomiting. The history is provided by the patient.  Emesis Severity:  Moderate Timing:  Intermittent Progression:  Worsening Chronicity:  New Associated symptoms: chills, diarrhea and myalgias   Associated symptoms: no abdominal pain, no cough and no fever   Risk factors: no sick contacts and no travel to endemic areas   pt reports she woke up with vomiting/diarrhea about 5-6 hours ago She denies any blood in vomit/stool She denies abd pain She had otherwise been healthy  Past Medical History  Diagnosis Date  . Migraines   . Back pain   . Asthma    History reviewed. No pertinent past surgical history. No family history on file. History  Substance Use Topics  . Smoking status: Former Games developermoker  . Smokeless tobacco: Never Used  . Alcohol Use: Yes     Comment: occasional   OB History   Grav Para Term Preterm Abortions TAB SAB Ect Mult Living                 Review of Systems  Constitutional: Positive for chills.  Gastrointestinal: Positive for vomiting and diarrhea. Negative for abdominal pain.  Genitourinary: Negative for dysuria.  Musculoskeletal: Positive for myalgias.  All other systems reviewed and are negative.     Allergies  Review of patient's allergies indicates no known allergies.  Home Medications   Prior to Admission medications   Not on File   BP 112/80  Pulse 62  Temp(Src) 98.2 F (36.8 C) (Oral)  Resp 20  Ht 5\' 5"  (1.651 m)  Wt 125 lb (56.7 kg)  BMI 20.80 kg/m2  SpO2 100%  LMP 06/02/2014 Physical Exam CONSTITUTIONAL: Well developed/well nourished HEAD: Normocephalic/atraumatic EYES: EOMI/PERRL, no icterus ENMT: Mucous membranes moist NECK: supple no meningeal signs SPINE:entire spine nontender CV: S1/S2 noted, no  murmurs/rubs/gallops noted LUNGS: Lungs are clear to auscultation bilaterally, no apparent distress ABDOMEN: soft, nontender, no rebound or guarding GU:no cva tenderness NEURO: Pt is awake/alert, moves all extremitiesx4 EXTREMITIES: pulses normal, full ROM SKIN: warm, color normal PSYCH: no abnormalities of mood noted  ED Course  Procedures   Pt improved, taking PO, laughing and smiling Appropriate for d/c home   MDM   Final diagnoses:  Vomiting and diarrhea    Nursing notes including past medical history and social history reviewed and considered in documentation     Joya Gaskinsonald W Daviona Herbert, MD 06/04/14 [phone]

## 2014-09-05 NOTE — L&D Delivery Note (Signed)
Patient is 24 y.o. G2P1001 [redacted]w[redacted]d admitted for SOL, hx of genital herpes.   Upon arrival patient was complete and pushing. She pushed with good maternal effort to deliver a healthy baby. Baby delivered without difficulty. Was noted to have a double loose nuchal and body nuchal. Had good tone and place on maternal abdomen for oral suctioning, drying and stimulation. Delayed cord clamping performed. Placenta delivered intact with 3V cord. Vaginal canal and perineum was inspected and intact; hemostatic. Pitocin was started and uterus massaged until bleeding slowed. Counts of sharps, instruments, and lap pads were all correct.   Delivery Note At 11:18 PM a viable female was delivered via Vaginal, Spontaneous Delivery (Presentation: Middle Occiput Posterior).  APGAR: 9, 9; weight pending. Placenta status: Intact, Spontaneous.  Cord: 3 vessels with the following complications: None.  Cord pH: Not collected.   Anesthesia: Epidural  Episiotomy: None Lacerations: None Suture Repair: N/A Est. Blood Loss (mL): 100   Mom to postpartum.  Baby to Couplet care / Skin to Skin.   Caryl Ada, DO 05/05/2015, 11:38 PM PGY-2, [facility]  OB fellow attestation: Patient is a G2P1001 at [redacted]w[redacted]d who was admitted SOL, significant hx of  HSV on suppressive therapy but otherwise uncomplicated prenatal course.   I was gloved and present for delivery in its entirety.  Second stage of labor progressed, baby delivered after 30-45 minutes of pushing. Infant was noted to be direct OP, have loose nuchal x2 and body cord. Variable decels during second stage noted, lowest to high 90s  Complications: none  Lacerations: none  EBL: 100  Federico Flake, MD 12:55 AM

## 2014-09-10 ENCOUNTER — Emergency Department (HOSPITAL_BASED_OUTPATIENT_CLINIC_OR_DEPARTMENT_OTHER)
Admission: EM | Admit: 2014-09-10 | Discharge: 2014-09-10 | Disposition: A | Discharge status: Home / Self Care | Payer: No Typology Code available for payment source | Attending: Emergency Medicine | Admitting: Emergency Medicine

## 2014-09-10 ENCOUNTER — Encounter (HOSPITAL_BASED_OUTPATIENT_CLINIC_OR_DEPARTMENT_OTHER): Payer: Self-pay

## 2014-09-10 DIAGNOSIS — Z8679 Personal history of other diseases of the circulatory system: Secondary | ICD-10-CM | POA: Insufficient documentation

## 2014-09-10 DIAGNOSIS — Z87891 Personal history of nicotine dependence: Secondary | ICD-10-CM | POA: Insufficient documentation

## 2014-09-10 DIAGNOSIS — O26891 Other specified pregnancy related conditions, first trimester: Secondary | ICD-10-CM

## 2014-09-10 DIAGNOSIS — O99511 Diseases of the respiratory system complicating pregnancy, first trimester: Secondary | ICD-10-CM | POA: Insufficient documentation

## 2014-09-10 DIAGNOSIS — J45909 Unspecified asthma, uncomplicated: Secondary | ICD-10-CM | POA: Insufficient documentation

## 2014-09-10 DIAGNOSIS — R51 Headache: Secondary | ICD-10-CM | POA: Insufficient documentation

## 2014-09-10 DIAGNOSIS — Z3A00 Weeks of gestation of pregnancy not specified: Secondary | ICD-10-CM | POA: Insufficient documentation

## 2014-09-10 DIAGNOSIS — O9989 Other specified diseases and conditions complicating pregnancy, childbirth and the puerperium: Secondary | ICD-10-CM | POA: Insufficient documentation

## 2014-09-10 LAB — PREGNANCY, URINE: PREG TEST UR: POSITIVE — AB

## 2014-09-10 MED ORDER — ACETAMINOPHEN 325 MG PO TABS
650.0000 mg | ORAL_TABLET | Freq: Once | ORAL | Status: AC
  Administered 2014-09-10: 650 mg via ORAL
  Filled 2014-09-10: qty 2

## 2014-09-10 MED ORDER — BUTALBITAL-APAP-CAFFEINE 50-325-40 MG PO TABS: 1.0000 | ORAL_TABLET | Freq: Four times a day (QID) | ORAL | Status: DC | PRN

## 2014-09-10 NOTE — ED Provider Notes (Signed)
CSN: [id number]     Arrival date & time 09/10/14  1711 History   First MD Initiated Contact with Patient 09/10/14 1732     Chief Complaint  Patient presents with  . Migraine     (Consider location/radiation/quality/duration/timing/severity/associated sxs/prior Treatment) HPI   24 year old female with history of migraine presents complaining of headache. Patient reports gradual onset of headache around both temples and around both eyes that has been ongoing for the past 3-4 days. She also reports blurred vision, lightheadedness, and symptoms felt similar to prior migraine headache she has many times in the past. She did attempt to take her home medication with minimal relieved. Her home medication including ibuprofen and Excedrin Migraine headache. She did not take any medication today. She reports that she has a positive home pregnancy test yesterday. Her last missed her period was November 23. She is a G2 P1. No complaint of abdominal pain vaginal bleeding.  Reports vaginal discharge but normal discharge for her. She is sexually active with one partner. No fever, chills, neck stiffness, numbness, weakness, or rash. Denies any nausea vomiting, chest pain shortness of breath.  Past Medical History  Diagnosis Date  . Migraines   . Back pain   . Asthma    History reviewed. No pertinent past surgical history. No family history on file. History  Substance Use Topics  . Smoking status: Former Games developer  . Smokeless tobacco: Never Used  . Alcohol Use: Yes   OB History    No data available     Review of Systems  All other systems reviewed and are negative.     Allergies  Review of patient's allergies indicates no known allergies.  Home Medications   Prior to Admission medications   Not on File   BP 125/91 mmHg  Pulse 68  Temp(Src) 98.4 F (36.9 C) (Oral)  Resp 16  Ht  (1.651 m)  Wt 125 lb (56.7 kg)  BMI 20.80 kg/m2  SpO2 100%  LMP 07/28/2014 Physical Exam   Constitutional: She is oriented to person, place, and time. She appears well-developed and well-nourished. No distress.  HENT:  Head: Atraumatic.  Right Ear: External ear normal.  Left Ear: External ear normal.  Mouth/Throat: Oropharynx is clear and moist.  Eyes: Conjunctivae and EOM are normal. Pupils are equal, round, and reactive to light.  Neck: Normal range of motion. Neck supple.  No nuchal rigidity  Musculoskeletal:  5/5 shows all 4 extremities.   Neurological: She is alert and oriented to person, place, and time.  Neurologic exam:  Speech clear, pupils equal round reactive to light, extraocular movements intact  Normal peripheral visual fields Cranial nerves III through XII normal including no facial droop Follows commands, moves all extremities x4, normal strength to bilateral upper and lower extremities at all major muscle groups including grip Sensation normal to light touch  Coordination intact, no limb ataxia, finger-nose-finger normal Rapid alternating movements normal No pronator drift Gait normal   Skin: No rash noted.  Psychiatric: She has a normal mood and affect.  Nursing note and vitals reviewed.   ED Course  Procedures (including critical care time)  6:27 PM Patient here with her typical migraine headache. She has no red flags. No acute onset severe Headache concerning for subarachnoid hemorrhage, no fever or nuchal rigidity concerning for meningitis, no focal neuro deficit concerning for stroke. She is pregnancy test positive with out any abdominal pain vaginal bleeding or discharge concerning for complication. Her blood pressure is normal and  low suspicion for preeclampsia.  6:30 PM I recommend patient to take Tylenol for headache. I will prescribe a short course of Fioricet for her to take for no more than several days. [facility]. Return precautions discussed.  Labs  Review Labs Reviewed  PREGNANCY, URINE - Abnormal; Notable for the following:    Preg Test, Ur POSITIVE (*)    All other components within normal limits    Imaging Review No results found.   EKG Interpretation None      MDM   Final diagnoses:  Headache in pregnancy, first trimester    BP 125/91 mmHg  Pulse 68  Temp(Src) 98.4 F (36.9 C) (Oral)  Resp 16  Ht 5\' 5"  (1.651 m)  Wt 125 lb (56.7 kg)  BMI 20.80 kg/m2  SpO2 100%  LMP 07/28/2014     Fayrene HelperBowie Demond Shallenberger, PA-C 09/10/14 1835  Tilden FossaElizabeth Rees, MD 09/11/14 0100

## 2014-09-10 NOTE — ED Notes (Signed)
C/o migraine, light headed, blurred vision x 3 days-positive home preg test yesterday

## 2014-09-10 NOTE — Discharge Instructions (Signed)
Please take Tylenol for your headache as needed. You may take Fioricet if the headaches as severe. [facility] with [facility] for outpatient management of your pregnancy. Use resource below as needed. Return to the ER if your condition worsen.   Emergency Department Resource Guide 1) Find a Doctor and Pay Out of Pocket Although you won't have to find out who is covered by your insurance plan, it is a good idea to ask around and get recommendations. You will then need to call the office and see if the doctor you have chosen will accept you as a new patient and what types of options they offer for patients who are self-pay. Some doctors offer discounts or will set up payment plans for their patients who do not have insurance, but you will need to ask so you aren't surprised when you get to your appointment.  2) Contact Your Local Health Department Not all health departments have doctors that can see patients for sick visits, but many do, so it is worth a call to see if yours does. If you don't know where your local health department is, you can check in your phone book. The CDC also has a tool to help you locate your state's health department, and many state websites also have listings of all of their local health departments.  3) Find a Walk-in Clinic If your illness is not likely to be very severe or complicated, you may want to try a walk in clinic. These are popping up all over the country in pharmacies, drugstores, and shopping centers. They're usually staffed by nurse practitioners or physician assistants that have been trained to treat common illnesses and complaints. They're usually fairly quick and inexpensive. However, if you have serious medical issues or chronic medical problems, these are probably not your best option.  No Primary Care Doctor: - Call Health Connect at  [phone] - they can help you locate a primary care doctor that  accepts your insurance, provides certain services,  etc. - Physician Referral Service- [phone]  Chronic Pain Problems: Organization         Address  Phone   Notes  [facility]  [phone] Patients need to be referred by their primary care doctor.   Medication Assistance: Organization         Address  Phone   Notes  [facility] Medication [facility] [address] [place], [place] [id number] [phone] --Must be a resident of [facility] [place] -- Must have NO insurance coverage whatsoever (no Medicaid/ Medicare, etc.) -- The pt. MUST have a primary care doctor that directs their care regularly and follows them in the community   MedAssist  [phone]   [employer]  [phone]    Agencies that provide inexpensive medical care: Organization         Address  Phone   Notes  [facility]  [phone]   [facility]    [phone]   [facility] [address] [place], [place] [address] [phone]   [facility] [address], [place] [phone]   Planned Parenthood    [phone]   [facility]    [phone]   [facility]  [address], [place] Phone:  [phone], Fax:  [phone] Hours of Operation:  9 am - 6 pm, M-F.  Also accepts Medicaid/Medicare  and self-pay.  [facility] for [place] [address], [address], [place] Phone: [phone], Fax: [phone]. Hours of Operation:  8:30 am - 5:30 pm, M-F.  Also accepts Medicaid and self-pay.  [facility] [place] [address], [place] Phone: [phone]   [place], [place], [place] [phone], Ext. [phone] Mondays & Thursdays: 7-9 AM.  First 15 patients are seen on a first come, first serve basis.    [place] Providers:  Organization         Address  Phone   Notes  [facility] [address], [place] [phone] Also accepts self-pay patients.  [facility] [address], [place]  [phone]   [place], [address], [place] [phone]   [facility] [address] [place] [phone]   Lucianne Lei [address], [place]   [phone] Only accepts [place] Access [place] patients after they have their name applied to their card.   Self-Pay (no insurance) in [facility]:  Organization         Address  Phone   Notes  Sickle Cell Patients, [facility] [place] [phone]   [facility] [place] [phone]   [facility] [place]  [place], [place], [place] [phone]   [facility]/Dr. Osei-Bonsu  [address] [place] or [address], [place] [phone] Phone number for both [place] and [place] locations is the same.  [facility] [address] [place] [phone]   [facility] [address] [place] or [address] [phone]   [facility] [place] [address] [place] [phone], phone; [phone], fax Sees patients 1st and 3rd Saturday of every month.  Must not qualify for public or private insurance (i.e. Medicaid, Medicare, [place] Health Choice, Veterans' Benefits)  Household income should be no more than 200% of the poverty level The clinic cannot treat you if you are pregnant or think you are pregnant  Sexually transmitted diseases are not treated at the clinic.    Dental Care: Organization         Address  Phone  Notes  [facility] Department of [facility] [phone] Accepts children up to  age 26 who are enrolled in [place] or [place]; pregnant women with a Medicaid card; and children who have applied for Medicaid or [place] Health Choice, but were declined, whose parents can pay a reduced fee at time of service.  [facility] Department of [facility]  [address], [place] [phone] Accepts children up to age 43 who are enrolled in [place] or [place]; pregnant women with a Medicaid card; and children who have applied for Medicaid or [place] Health Choice, but were declined, whose parents can pay a reduced fee at time of service.  [facility]  [place] [phone] Patients are seen by appointment only. Walk-ins are not accepted. [place] will see patients 55 years of age and older. Monday - Tuesday (8am-5pm) Most Wednesdays (  8:30-5pm) $30 per visit, cash only  [facility] Adult [facility] Access PROGRAM  [address], [facility] [phone] Patients are seen by appointment only. Walk-ins are not accepted. [place] will see patients 72 years of age and older. One Wednesday Evening (Monthly: Volunteer Based).  $30 per visit, cash only  [place]  [phone] for adults; Children under age 45, call Graduate Pediatric Dentistry at [phone]. Children aged 7-14, please call [phone] to request a pediatric application.  Dental services are provided in all areas of dental care including fillings, crowns and bridges, complete and partial dentures, implants, gum treatment, root canals, and extractions. Preventive care is also provided. Treatment is provided to both adults and children. Patients are selected via a lottery and there is often a waiting list.   [facility] [address], [place]  [phone] [email or web address]   [facility] [address] [place], [place] [phone], Ext. [phone] Second and Fourth Thursday of  each month, opens at 6:30 AM; Clinic ends at 9 AM.  Patients are seen on a first-come first-served basis, and a limited number are seen during each clinic.   [facility]  [address] Hillard Danker [place], [place] [phone]   Eligibility Requirements You must have lived in [place], [place], or [place] counties for at least the last three months.   You cannot be eligible for state or federal sponsored [employer], including [employer], [place], or [employer].   You generally cannot be eligible for healthcare insurance through your employer.    How to apply: Eligibility screenings are held every Tuesday and Wednesday afternoon from 1:00 pm until 4:00 pm. You do not need an appointment for the interview!  [facility] [address], [place], [place]   [place]  [place] Department  [place]  [phone]    Behavioral Health Resources in the Community: Intensive Outpatient Programs Organization         Address  Phone  Notes  [place]. [address], [place], [place] [phone]   [facility] Outpatient [address] [place], [place]   ADS: Alcohol & Drug S[facility] [address] [place], [place]   [place] [address],  [place], [place] or [phone]   Substance Abuse Resources Organization         Address  Phone  Notes  Alcohol and Drug Services  [phone]   [place]  [phone]   [facility] [place]   Chinita Pester  [phone]   Residential & Outpatient Substance Abuse Program  [phone]   Psychological Services Organization         Address  Phone  Notes  [facility] [place]  [place]  [phone]   [place] [address],  [place] or [phone]    Mobile Crisis Teams Organization         Address  Phone  Notes  [facility], Mobile Crisis Care Unit  [phone]   [facility]otherapeutic [facility]  [address] [place], [place]   [place] [address] [place] [phone]    Self-Help/Support Groups Organization         Address  Phone  Notes  [facility] [place] - variety of support groups  [place] Call for more information  Narcotics Anonymous (NA), [facility] [address], [employer] [place]  2 meetings at this location   Special educational needs teacher         Address  Phone  Notes  ASAP Residential Treatment [place],    [place]  [phone]   [facility]  [address], [place] [phone], [place], [place]   [place], [place] [phone] Admissions: 8am-3pm M-F  Incentives Substance [place] [address],    [place], [place] [phone]   [facility] [address] [place], [place], [place]   [facility] [address]  [place], [place]   [facility] - Intensive Outpatient [address], [place], [place]   [facility] ([place].) [place].,  [place], [place] [phone] or [phone]   [facility] Services (RTS) [address] [place], [place] Accepts Medicaid  [facility] [place] [address],  [place] [phone] Substance Abuse/Addiction Treatment   [facility] Organization         Address  Phone  Notes  [facility]  [phone]   Domenic Schwab, PhD [address] Arlis Porta [place], [place]   [phone] or [phone]   [place], [place] [phone]     [facility] [address], [place], [place] [phone] Insurance/Medicaid/sponsorship through [facility] and Families [address], [address]                                    [place], [place] [phone] [place] [address], [place] [phone]    Dr. Adele Schilder  [phone]   [facility] Dept. 1) [address], [place] 2) [place] 3)  [address], [place] [phone]  [phone]   [place] [phone] or [phone] (After Hours)

## 2014-09-12 ENCOUNTER — Telehealth: Payer: Self-pay | Admitting: *Deleted

## 2014-09-12 DIAGNOSIS — O3680X1 Pregnancy with inconclusive fetal viability, fetus 1: Secondary | ICD-10-CM

## 2014-09-12 NOTE — Telephone Encounter (Signed)
Pt called the clinic requesting an ultrasound.  Attempted to contact patient with ultrasound date/time, no answer, left message with date/time of ultrasound and after results we would know how to proceed with care.  If you have any questions please call the clinic.

## 2014-09-15 ENCOUNTER — Encounter (HOSPITAL_COMMUNITY): Payer: Self-pay | Admitting: *Deleted

## 2014-09-15 ENCOUNTER — Inpatient Hospital Stay (HOSPITAL_COMMUNITY)
Admission: AD | Admit: 2014-09-15 | Discharge: 2014-09-15 | Disposition: A | Discharge status: Home / Self Care | Payer: BLUE CROSS/BLUE SHIELD | Source: Ambulatory Visit | Attending: Family Medicine | Admitting: Family Medicine

## 2014-09-15 ENCOUNTER — Inpatient Hospital Stay (HOSPITAL_COMMUNITY): Payer: BLUE CROSS/BLUE SHIELD

## 2014-09-15 DIAGNOSIS — R109 Unspecified abdominal pain: Secondary | ICD-10-CM

## 2014-09-15 DIAGNOSIS — J45909 Unspecified asthma, uncomplicated: Secondary | ICD-10-CM | POA: Diagnosis not present

## 2014-09-15 DIAGNOSIS — Z3A01 Less than 8 weeks gestation of pregnancy: Secondary | ICD-10-CM | POA: Insufficient documentation

## 2014-09-15 DIAGNOSIS — R1030 Lower abdominal pain, unspecified: Secondary | ICD-10-CM | POA: Insufficient documentation

## 2014-09-15 DIAGNOSIS — O9989 Other specified diseases and conditions complicating pregnancy, childbirth and the puerperium: Secondary | ICD-10-CM | POA: Insufficient documentation

## 2014-09-15 DIAGNOSIS — Z3490 Encounter for supervision of normal pregnancy, unspecified, unspecified trimester: Secondary | ICD-10-CM

## 2014-09-15 DIAGNOSIS — O99511 Diseases of the respiratory system complicating pregnancy, first trimester: Secondary | ICD-10-CM | POA: Insufficient documentation

## 2014-09-15 LAB — CBC WITH DIFFERENTIAL/PLATELET
Basophils Absolute: 0 10*3/uL (ref 0.0–0.1)
Basophils Relative: 1 % (ref 0–1)
EOS PCT: 4 % (ref 0–5)
Eosinophils Absolute: 0.3 10*3/uL (ref 0.0–0.7)
HCT: 36.4 % (ref 36.0–46.0)
Hemoglobin: 12.9 g/dL (ref 12.0–15.0)
Lymphocytes Relative: 41 % (ref 12–46)
Lymphs Abs: 3.2 10*3/uL (ref 0.7–4.0)
MCH: 27.7 pg (ref 26.0–34.0)
MCHC: 35.4 g/dL (ref 30.0–36.0)
MCV: 78.3 fL (ref 78.0–100.0)
MONOS PCT: 4 % (ref 3–12)
Monocytes Absolute: 0.3 10*3/uL (ref 0.1–1.0)
NEUTROS ABS: 3.9 10*3/uL (ref 1.7–7.7)
Neutrophils Relative %: 50 % (ref 43–77)
Platelets: 264 10*3/uL (ref 150–400)
RBC: 4.65 MIL/uL (ref 3.87–5.11)
RDW: 12.7 % (ref 11.5–15.5)
WBC: 7.7 10*3/uL (ref 4.0–10.5)

## 2014-09-15 LAB — URINALYSIS, ROUTINE W REFLEX MICROSCOPIC
Bilirubin Urine: NEGATIVE
Glucose, UA: NEGATIVE mg/dL
HGB URINE DIPSTICK: NEGATIVE
Ketones, ur: 40 mg/dL — AB
Leukocytes, UA: NEGATIVE
Nitrite: NEGATIVE
Protein, ur: NEGATIVE mg/dL
Specific Gravity, Urine: >1.03 — ABNORMAL HIGH (ref 1.005–1.030)
Urobilinogen, UA: 0.2 mg/dL (ref 0.0–1.0)
pH: 5.5 (ref 5.0–8.0)

## 2014-09-15 LAB — ABO/RH: ABO/RH(D): B POS

## 2014-09-15 LAB — HCG, QUANTITATIVE, PREGNANCY: hCG, Beta Chain, Quant, S: 5625 m[IU]/mL — ABNORMAL HIGH (ref <5)

## 2014-09-15 LAB — WET PREP, GENITAL
Trich, Wet Prep: NONE SEEN
YEAST WET PREP: NONE SEEN

## 2014-09-15 MED ORDER — ALBUTEROL SULFATE HFA 108 (90 BASE) MCG/ACT IN AERS: 1.0000 | INHALATION_SPRAY | Freq: Four times a day (QID) | RESPIRATORY_TRACT | Status: DC | PRN

## 2014-09-15 MED ORDER — PRENATAL PLUS 27-1 MG PO TABS: 1.0000 | ORAL_TABLET | Freq: Every day | ORAL | Status: DC

## 2014-09-15 NOTE — MAU Provider Note (Signed)
History     CSN: [id number]  Arrival date and time: 09/15/14 1629   None     Chief Complaint  Patient presents with  . Abdominal Pain  . Asthma   HPI Julia Morgan is 24 y.o. G2P1001.  Had + [facility] at [facility] days ago.  [redacted]w[redacted]d weeks by LMP 07/28/14 presenting with left lower abdominal pain X 4 days.  She describes as intermittent, sharp pains that last 10 minutes and occur q4 hrs. She denies vaginal bleeding. Had bowel movement this am-normal. She denies hx of GC/CHL. 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  She also states has had "problems" with asthma --shortness of breath and wheezing at night.  She moved here from [place] several years ago and has not found a doctor.  She has not used an inhaler in years. She was seen at [facility] ED 3 days ago for treatment of migraine.  Dx age 89. They gave her  Rx for Fioricet but was afraid to use it.  She plans [facility].    Past Medical History  Diagnosis Date  . Migraines   . Back pain   . Asthma     History reviewed. No pertinent past surgical history.  History reviewed. No pertinent family history.  History  Substance Use Topics  . Smoking status: Never Smoker   . Smokeless tobacco: Never Used  . Alcohol Use: Yes    Allergies: No Known Allergies  Prescriptions prior to admission  Medication Sig Dispense Refill Last Dose  . acetaminophen (TYLENOL) 500 MG tablet Take 1,000 mg by mouth every 6 (six) hours as needed for headache (Used for Migraines.).   Past Week at Unknown time  . albuterol (PROVENTIL HFA;VENTOLIN HFA) 108 (90 BASE) MCG/ACT inhaler Inhale 1 puff into the lungs every 6 (six) hours as needed for wheezing or shortness of breath.     . butalbital-acetaminophen-caffeine (FIORICET) 50-325-40 MG per tablet Take 1 tablet by mouth every 6 (six) hours as needed for headache or migraine. (Patient not taking: Reported on 09/15/2014) 6 tablet 0 Not Taking at Unknown time    Review of Systems  Constitutional: Negative for fever  and chills.  Eyes: Negative for blurred vision, double vision and photophobia.  Respiratory: Positive for shortness of breath and wheezing.   Cardiovascular: Negative for chest pain and palpitations.  Gastrointestinal: Negative for nausea and vomiting (left mid to lower abdominal pain).  Genitourinary: Negative for dysuria, urgency, frequency and hematuria.       Neg for vaginal discharge or bleeding  Neurological: Positive for headaches.   Physical Exam   Blood pressure 124/76, pulse 69, temperature 99.4 F (37.4 C), resp. rate 18, height  (1.651 m), weight 123 lb (55.792 kg), last menstrual period 07/28/2014, SpO2 100 %.  Physical Exam  Constitutional: She is oriented to person, place, and time. She appears well-developed and well-nourished. No distress.  HENT:  Head: Normocephalic.  Neck: Normal range of motion.  Cardiovascular: Normal rate.   Respiratory: Effort normal. No respiratory distress. She has wheezes (bilateral mild wheezing that mostly cleared with deep cough). She has no rales. She exhibits no tenderness.  O2 Sats 100%  GI: Soft. She exhibits no distension and no mass. There is tenderness (left sided mid to lower abdominal tenderness). There is no rebound and no guarding.  Genitourinary: There is no rash, tenderness or lesion on the right labia. There is no rash, tenderness or lesion on the left labia. Uterus is enlarged (slightly).  Uterus is not tender. Cervix exhibits no motion tenderness, no discharge and no friability. Right adnexum displays no mass, no tenderness and no fullness. Left adnexum displays tenderness (mild). Left adnexum displays no mass and no fullness. No erythema or bleeding in the vagina. No foreign body around the vagina. Vaginal discharge (moderate amount of frothy discharge without odor) found.  Neurological: She is alert and oriented to person, place, and time.  Skin: Skin is warm and dry.  Psychiatric: She has a normal mood and affect. Her  behavior is normal.   Results for orders placed or performed during the hospital encounter of 09/15/14 (from the past 24 hour(s))  Urinalysis, Routine w reflex microscopic     Status: Abnormal   Collection Time: 09/15/14  5:15 PM  Result Value Ref Range   Color, Urine YELLOW YELLOW   APPearance CLEAR CLEAR   Specific Gravity, Urine >1.030 (H) 1.005 - 1.030   pH 5.5 5.0 - 8.0   Glucose, UA NEGATIVE NEGATIVE mg/dL   Hgb urine dipstick NEGATIVE NEGATIVE   Bilirubin Urine NEGATIVE NEGATIVE   Ketones, ur 40 (A) NEGATIVE mg/dL   Protein, ur NEGATIVE NEGATIVE mg/dL   Urobilinogen, UA 0.2 0.0 - 1.0 mg/dL   Nitrite NEGATIVE NEGATIVE   Leukocytes, UA NEGATIVE NEGATIVE  CBC with Differential     Status: None   Collection Time: 09/15/14  6:33 PM  Result Value Ref Range   WBC 7.7 4.0 - 10.5 K/uL   RBC 4.65 3.87 - 5.11 MIL/uL   Hemoglobin 12.9 12.0 - 15.0 g/dL   HCT 16.136.4 09.636.0 - 04.546.0 %   MCV 78.3 78.0 - 100.0 fL   MCH 27.7 26.0 - 34.0 pg   MCHC 35.4 30.0 - 36.0 g/dL   RDW 40.912.7 81.111.5 - 91.415.5 %   Platelets 264 150 - 400 K/uL   Neutrophils Relative % 50 43 - 77 %   Neutro Abs 3.9 1.7 - 7.7 K/uL   Lymphocytes Relative 41 12 - 46 %   Lymphs Abs 3.2 0.7 - 4.0 K/uL   Monocytes Relative 4 3 - 12 %   Monocytes Absolute 0.3 0.1 - 1.0 K/uL   Eosinophils Relative 4 0 - 5 %   Eosinophils Absolute 0.3 0.0 - 0.7 K/uL   Basophils Relative 1 0 - 1 %   Basophils Absolute 0.0 0.0 - 0.1 K/uL  ABO/Rh     Status: None (Preliminary result)   Collection Time: 09/15/14  6:33 PM  Result Value Ref Range   ABO/RH(D) B POS   Wet prep, genital     Status: Abnormal   Collection Time: 09/15/14  6:55 PM  Result Value Ref Range   Yeast Wet Prep HPF POC NONE SEEN NONE SEEN   Trich, Wet Prep NONE SEEN NONE SEEN   Clue Cells Wet Prep HPF POC FEW (A) NONE SEEN   WBC, Wet Prep HPF POC FEW (A) NONE SEEN   MAU Course  Procedures  MDM Care turned over to D. Poe, CNM  Assessment and Plan  A:  Lower abdominal pain in  first trimester.      Hx of migraine headaches      Hx of asthma with mild wheezing  P:  Albuterol inhaler      KEY,EVE M 09/15/2014, 7:50 PM

## 2014-09-15 NOTE — MAU Provider Note (Signed)
Addendum to note of 09/15/2014 6:36 pm Results for orders placed or performed during the hospital encounter of 09/15/14 (from the past 24 hour(s))  Urinalysis, Routine w reflex microscopic     Status: Abnormal   Collection Time: 09/15/14  5:15 PM  Result Value Ref Range   Color, Urine YELLOW YELLOW   APPearance CLEAR CLEAR   Specific Gravity, Urine >1.030 (H) 1.005 - 1.030   pH 5.5 5.0 - 8.0   Glucose, UA NEGATIVE NEGATIVE mg/dL   Hgb urine dipstick NEGATIVE NEGATIVE   Bilirubin Urine NEGATIVE NEGATIVE   Ketones, ur 40 (A) NEGATIVE mg/dL   Protein, ur NEGATIVE NEGATIVE mg/dL   Urobilinogen, UA 0.2 0.0 - 1.0 mg/dL   Nitrite NEGATIVE NEGATIVE   Leukocytes, UA NEGATIVE NEGATIVE  CBC with Differential     Status: None   Collection Time: 09/15/14  6:33 PM  Result Value Ref Range   WBC 7.7 4.0 - 10.5 K/uL   RBC 4.65 3.87 - 5.11 MIL/uL   Hemoglobin 12.9 12.0 - 15.0 g/dL   HCT 16.136.4 09.636.0 - 04.546.0 %   MCV 78.3 78.0 - 100.0 fL   MCH 27.7 26.0 - 34.0 pg   MCHC 35.4 30.0 - 36.0 g/dL   RDW 40.912.7 81.111.5 - 91.415.5 %   Platelets 264 150 - 400 K/uL   Neutrophils Relative % 50 43 - 77 %   Neutro Abs 3.9 1.7 - 7.7 K/uL   Lymphocytes Relative 41 12 - 46 %   Lymphs Abs 3.2 0.7 - 4.0 K/uL   Monocytes Relative 4 3 - 12 %   Monocytes Absolute 0.3 0.1 - 1.0 K/uL   Eosinophils Relative 4 0 - 5 %   Eosinophils Absolute 0.3 0.0 - 0.7 K/uL   Basophils Relative 1 0 - 1 %   Basophils Absolute 0.0 0.0 - 0.1 K/uL  ABO/Rh     Status: None (Preliminary result)   Collection Time: 09/15/14  6:33 PM  Result Value Ref Range   ABO/RH(D) B POS   hCG, quantitative, pregnancy     Status: Abnormal   Collection Time: 09/15/14  6:40 PM  Result Value Ref Range   hCG, Beta Chain, Quant, S 5625 (H) <5 mIU/mL  Wet prep, genital     Status: Abnormal   Collection Time: 09/15/14  6:55 PM  Result Value Ref Range   Yeast Wet Prep HPF POC NONE SEEN NONE SEEN   Trich, Wet Prep NONE SEEN NONE SEEN   Clue Cells Wet Prep HPF POC FEW  (A) NONE SEEN   WBC, Wet Prep HPF POC FEW (A) NONE SEEN   Reviewed US Preliminary report: GS  with MSD c/w 3349w2d size, YS present, no embyo seen, small CLC  ASSESSMENT: G2P1001 at 6770w0d LMP  IUP 5549w2d, viability not determined Asthma  PLAN: F/U with PN provider of choice  Return for worsening pain or vaginal bleeding.   Medication List    STOP taking these medications        butalbital-acetaminophen-caffeine 50-325-40 MG per tablet  Commonly known as:  FIORICET      TAKE these medications        acetaminophen 500 MG tablet  Commonly known as:  TYLENOL  Take 1,000 mg by mouth every 6 (six) hours as needed for headache (Used for Migraines.).     albuterol 108 (90 BASE) MCG/ACT inhaler  Commonly known as:  PROVENTIL HFA;VENTOLIN HFA  Inhale 1-2 puffs into the lungs every 6 (six) hours as needed for  wheezing or shortness of breath.     prenatal vitamin w/FE, FA 27-1 MG Tabs tablet  Take 1 tablet by mouth daily.       Follow-up Information    Follow up with [facility] [place].   Contact information:   [address] [place] [address] [phone]      Schedule an appointment as soon as possible for a visit in 1 week to follow up.

## 2014-09-15 NOTE — Discharge Instructions (Signed)
Abdominal Pain During Pregnancy °Abdominal pain is common in pregnancy. Most of the time, it does not cause harm. There are many causes of abdominal pain. Some causes are more serious than others. Some of the causes of abdominal pain in pregnancy are easily diagnosed. Occasionally, the diagnosis takes time to understand. Other times, the cause is not determined. Abdominal pain can be a sign that something is very wrong with the pregnancy, or the pain may have nothing to do with the pregnancy at all. For this reason, always tell your health care provider if you have any abdominal discomfort. °HOME CARE INSTRUCTIONS  °Monitor your abdominal pain for any changes. The following actions may help to alleviate any discomfort you are experiencing: °· Do not have sexual intercourse or put anything in your vagina until your symptoms go away completely. °· Get plenty of rest until your pain improves. °· Drink clear fluids if you feel nauseous. Avoid solid food as long as you are uncomfortable or nauseous. °· Only take over-the-counter or prescription medicine as directed by your health care provider. °· Keep all follow-up appointments with your health care provider. °SEEK IMMEDIATE MEDICAL CARE IF: °· You are bleeding, leaking fluid, or passing tissue from the vagina. °· You have increasing pain or cramping. °· You have persistent vomiting. °· You have painful or bloody urination. °· You have a fever. °· You notice a decrease in your baby's movements. °· You have extreme weakness or feel faint. °· You have shortness of breath, with or without abdominal pain. °· You develop a severe headache with abdominal pain. °· You have abnormal vaginal discharge with abdominal pain. °· You have persistent diarrhea. °· You have abdominal pain that continues even after rest, or gets worse. °MAKE SURE YOU:  °· Understand these instructions. °· Will watch your condition. °· Will get help right away if you are not doing well or get  worse. °Document Released: 08/22/2005 Document Revised: 06/12/2013 Document Reviewed: 03/21/2013 °ExitCare® Patient Information ©2015 [employer]. This information is not intended to replace advice given to you by your health care provider. Make sure you discuss any questions you have with your health care provider. °Prenatal Care Providers °[facility]    [facility] ° Phone- [phone]     Phone: [phone] °         °[facility]                      [facility] ° @[place]     Phone: [phone] ° Phone: [phone] °        [facility] °[facility]     Phone: [phone] ° Phone: [phone]   °        [facility] °[facility] [place]                hone: [phone] ° Phone: [phone] °        [facility] °Dr. Bernard Marshall      Phone: [phone] ° Phone: [phone] °         [facility] °[facility]                Phone: [phone] ° [facility]  ° Phone:[phone]    [facility] ([place]) °         Phone: [phone] °[facility] °  Phone: [phone] ° °    ________________________________________ ° ° ° ° °To schedule your Maternity Eligibility Appointment, please call [phone].  When you arrive for your appointment you must bring the following items or information listed below.  Your appointment will be rescheduled if you do not have these items or are 15 minutes late. °If currently receiving Medicaid, you MUST bring: °1. Medicaid Card °2. Social Security Card °3. Picture ID °4. Proof of Pregnancy °5. Verification of current address if the address on Medicaid card is incorrect "postmarked mail" °If not receiving Medicaid, you MUST bring: °1. Social Security Card °2. Picture ID °3. Birth Certificate (if available) Passport or *Green Card °4. Proof of Pregnancy °5. Verification of current address "postmarked mail" for each  income presented. °6. Verification of insurance coverage, if any °7. Check stubs from each employer for the previous month (if unable to present check stub  °for each week, we will accept check stub for the first and last week ill the same month.) If you can't locate check stubs, you must bring a letter from the employer(s) and it must have the following information on letterhead, typed, in English: °o name of company °o company telephone number °o how long been with the company, if less than one month °o how much person earns per hour °o how many hours per week work °o the gross pay the person earned for the previous month °If you are 25 years old or less, you do not have to bring proof of income unless you work or live with the father of the baby and at that time we will need proof of income from you and/or the father of the baby. °Green Card recipients are eligible for Medicaid for Pregnant Women (MPW) ° ° °

## 2014-09-15 NOTE — MAU Note (Signed)
Lower Left side pain, [redacted] weeks pregnant, has asthma, wheezing per pt, no inhaler, no MD in this area treating asthma

## 2014-09-16 LAB — HIV ANTIBODY (ROUTINE TESTING W REFLEX)
HIV 1/HIV 2 AB: NONREACTIVE
HIV 1/O/2 Abs-Index Value: <1 (ref <1.00)

## 2014-09-17 LAB — GC/CHLAMYDIA PROBE AMP
CT Probe RNA: NEGATIVE
GC PROBE AMP APTIMA: NEGATIVE

## 2014-09-18 ENCOUNTER — Ambulatory Visit (HOSPITAL_COMMUNITY): Payer: BLUE CROSS/BLUE SHIELD

## 2014-09-22 ENCOUNTER — Telehealth: Payer: Self-pay | Admitting: *Deleted

## 2014-09-22 DIAGNOSIS — O3680X1 Pregnancy with inconclusive fetal viability, fetus 1: Secondary | ICD-10-CM

## 2014-09-22 NOTE — Telephone Encounter (Signed)
Patient returned call. Informed her of U/S results and follow up appointment. Advised patient to wait to schedule New OB until after U/S results on 2/1. Patient verbalized understanding. No further questions or concerns.

## 2014-09-22 NOTE — Telephone Encounter (Signed)
Ultrasound appointment on 2/1 @ 1015.  Prior ultrasound results fetal sac 5w 2 d, needs follow up ultrasound to verify viability.  (10/06/14 @ 1015)  Attempted to contact patient, no answer, left message for patient to call the clinic, if we can leave the message on a secure voicemail she can indicate that information on a message to the clinic.

## 2014-10-06 ENCOUNTER — Ambulatory Visit (HOSPITAL_COMMUNITY)
Admission: RE | Admit: 2014-10-06 | Discharge: 2014-10-06 | Disposition: A | Discharge status: Home / Self Care | Payer: BLUE CROSS/BLUE SHIELD | Source: Ambulatory Visit | Attending: Obstetrics & Gynecology | Admitting: Obstetrics & Gynecology

## 2014-10-06 DIAGNOSIS — Z3A08 8 weeks gestation of pregnancy: Secondary | ICD-10-CM | POA: Insufficient documentation

## 2014-10-06 DIAGNOSIS — Z36 Encounter for antenatal screening of mother: Secondary | ICD-10-CM | POA: Diagnosis present

## 2014-10-06 DIAGNOSIS — O3680X1 Pregnancy with inconclusive fetal viability, fetus 1: Secondary | ICD-10-CM

## 2014-10-10 ENCOUNTER — Telehealth: Payer: Self-pay | Admitting: *Deleted

## 2014-10-10 NOTE — Telephone Encounter (Signed)
Opened in error

## 2014-11-03 ENCOUNTER — Other Ambulatory Visit: Payer: Self-pay | Admitting: Advanced Practice Midwife

## 2014-11-03 ENCOUNTER — Encounter: Payer: Self-pay | Admitting: Advanced Practice Midwife

## 2014-11-03 ENCOUNTER — Ambulatory Visit (INDEPENDENT_AMBULATORY_CARE_PROVIDER_SITE_OTHER): Payer: BLUE CROSS/BLUE SHIELD | Admitting: Advanced Practice Midwife

## 2014-11-03 VITALS — BP 121/67 | HR 88 | Wt 127.0 lb

## 2014-11-03 DIAGNOSIS — Z124 Encounter for screening for malignant neoplasm of cervix: Secondary | ICD-10-CM

## 2014-11-03 DIAGNOSIS — Z3482 Encounter for supervision of other normal pregnancy, second trimester: Secondary | ICD-10-CM | POA: Diagnosis not present

## 2014-11-03 DIAGNOSIS — Z3492 Encounter for supervision of normal pregnancy, unspecified, second trimester: Secondary | ICD-10-CM

## 2014-11-03 DIAGNOSIS — Z23 Encounter for immunization: Secondary | ICD-10-CM

## 2014-11-03 MED ORDER — INFLUENZA VAC SPLIT QUAD 0.5 ML IM SUSY
0.5000 mL | PREFILLED_SYRINGE | Freq: Once | INTRAMUSCULAR | Status: AC
  Administered 2014-11-03: 0.5 mL via INTRAMUSCULAR

## 2014-11-03 NOTE — Progress Notes (Signed)
Needs pap.  Interested in [facility]

## 2014-11-04 LAB — OBSTETRIC PANEL
ANTIBODY SCREEN: NEGATIVE
BASOS ABS: 0 10*3/uL (ref 0.0–0.1)
BASOS PCT: 0 % (ref 0–1)
Eosinophils Absolute: 0.2 10*3/uL (ref 0.0–0.7)
Eosinophils Relative: 4 % (ref 0–5)
HEMATOCRIT: 32 % — AB (ref 36.0–46.0)
HEMOGLOBIN: 10.9 g/dL — AB (ref 12.0–15.0)
Hepatitis B Surface Ag: NEGATIVE
LYMPHS PCT: 32 % (ref 12–46)
Lymphs Abs: 1.6 10*3/uL (ref 0.7–4.0)
MCH: 26.9 pg (ref 26.0–34.0)
MCHC: 34.1 g/dL (ref 30.0–36.0)
MCV: 79 fL (ref 78.0–100.0)
MONO ABS: 0.4 10*3/uL (ref 0.1–1.0)
MPV: 10.7 fL (ref 8.6–12.4)
Monocytes Relative: 7 % (ref 3–12)
Neutro Abs: 2.9 10*3/uL (ref 1.7–7.7)
Neutrophils Relative %: 57 % (ref 43–77)
PLATELETS: 283 10*3/uL (ref 150–400)
RBC: 4.05 MIL/uL (ref 3.87–5.11)
RDW: 13.8 % (ref 11.5–15.5)
RUBELLA: 8.92 {index} — AB (ref <0.90)
Rh Type: POSITIVE
WBC: 5 10*3/uL (ref 4.0–10.5)

## 2014-11-04 LAB — GC/CHLAMYDIA PROBE AMP
CT Probe RNA: NEGATIVE
GC Probe RNA: NEGATIVE

## 2014-11-04 LAB — SICKLE CELL SCREEN: SICKLE CELL SCREEN: NEGATIVE

## 2014-11-04 LAB — CYTOLOGY - PAP

## 2014-11-04 LAB — ALCOHOL METABOLITE (ETG), URINE: Ethyl Glucuronide (EtG): NEGATIVE ng/mL

## 2014-11-04 LAB — HIV ANTIBODY (ROUTINE TESTING W REFLEX): HIV: NONREACTIVE

## 2014-11-05 LAB — CULTURE, URINE COMPREHENSIVE
COLONY COUNT: NO GROWTH
ORGANISM ID, BACTERIA: NO GROWTH

## 2014-11-05 NOTE — Progress Notes (Signed)
   Subjective:    Julia Morgan is a G2P1001 3112w3d by 8 week US being seen today for her first obstetrical visit.  Her obstetrical history is significant for nothing. . Patient does intend to breast feed. Pregnancy history fully reviewed. Interested in [facility]. Female partner Julia Morgan.   Patient reports no complaints. Hx Asthma. Rare inhaler use.   Filed Vitals:   11/03/14 1009  BP: 121/67  Pulse: 88  Weight: 127 lb (57.607 kg)    HISTORY: OB History  Gravida Para Term Preterm AB SAB TAB Ectopic Multiple Living  2 1 1       2     # Outcome Date GA Lbr Len/2nd Weight Sex Delivery Anes PTL Lv  2 Current           1 Term 09/15/07 3640w0d   Julia Morgan   N Y     Past Medical History  Diagnosis Date  . Migraines   . Back pain   . Asthma   . PONV (postoperative nausea and vomiting)   . HSV-2 infection    History reviewed. No pertinent past surgical history. Family History  Problem Relation Age of Onset  . Hyperlipidemia Mother   . Hypertension Mother   . Hyperlipidemia Maternal Grandmother   . Hypertension Maternal Grandmother   . Cancer - Cervical Paternal Grandmother      Exam    Uterus:   FHR 150's by doppler and US  Pelvic Exam:    Perineum: Normal Perineum   Vulva: normal, Bartholin's, Urethra, Skene's normal   Vagina:  normal mucosa, normal discharge   pH: NA   Cervix: multiparous appearance, no bleeding following Pap and no cervical motion tenderness   Adnexa: normal adnexa and no mass, fullness, tenderness   Bony Pelvis: proven to 7+  System: Breast:  normal appearance, no masses or tenderness   Skin: normal coloration and turgor, no rashes    Neurologic: oriented, normal mood, grossly non-focal   Extremities: normal strength, tone, and muscle mass   HEENT sclera clear, anicteric   Mouth/Teeth mucous membranes moist, pharynx normal without lesions and dental hygiene good   Neck supple and no masses   Cardiovascular: regular rate and rhythm, no murmurs or gallops    Respiratory:  appears well, vitals normal, no respiratory distress, acyanotic, normal RR, chest clear, no wheezing, crepitations, rhonchi, normal symmetric air entry   Abdomen: soft, non-tender; bowel sounds normal; no masses,  no organomegaly   Urinary: urethral meatus normal      Assessment:    Pregnancy: G2P1002 1. Normal pregnancy in second trimester  - Prenatal (OB Panel) - HIV antibody (with reflex) - CULTURE, URINE COMPREHENSIVE - Sickle Cell Scr - GC/Chlamydia Probe Amp - Cytology - PAP - Alcohol metabolite (ETG), urine - Prescript Monitor Profile(19) - Influenza vac split quadrivalent PF (FLUARIX) injection 0.5 mL; Inject 0.5 mLs into the muscle once.  2. Supervision of normal pregnancy in second trimester  - Influenza vac split quadrivalent PF (FLUARIX) injection 0.5 mL; Inject 0.5 mLs into the muscle once.  Plan:     Initial labs drawn. Prenatal vitamins. Problem list reviewed and updated. Genetic Screening discussed First Screen: declined. Desires quad only if concerns on anatomy US.  Ultrasound discussed; fetal survey: requested.  Follow up in 4 weeks. 50% of 30 min visit spent on counseling and coordination of care.  Flu vaccine.   Julia Morgan, Julia Morgan 11/05/2014

## 2014-11-06 LAB — CANNABANOIDS (GC/LC/MS), URINE: THC-COOH (GC/LC/MS), ur confirm: 127 ng/mL — AB (ref <5)

## 2014-11-07 LAB — PRESCRIPTION MONITORING PROFILE (19 PANEL)
AMPHETAMINE/METH: NEGATIVE ng/mL
BUPRENORPHINE, URINE: NEGATIVE ng/mL
Barbiturate Screen, Urine: NEGATIVE ng/mL
Benzodiazepine Screen, Urine: NEGATIVE ng/mL
CREATININE, URINE: 52.1 mg/dL (ref >20.0)
Carisoprodol, Urine: NEGATIVE ng/mL
Cocaine Metabolites: NEGATIVE ng/mL
FENTANYL URINE: NEGATIVE ng/mL
MDMA URINE: NEGATIVE ng/mL
METHADONE SCREEN, URINE: NEGATIVE ng/mL
Meperidine, Ur: NEGATIVE ng/mL
Methaqualone: NEGATIVE ng/mL
Nitrites, Initial: NEGATIVE ug/mL
OXYCODONE SCRN UR: NEGATIVE ng/mL
Opiate Screen, Urine: NEGATIVE ng/mL
PHENCYCLIDINE, UR: NEGATIVE ng/mL
Propoxyphene: NEGATIVE ng/mL
TRAMADOL UR: NEGATIVE ng/mL
Tapentadol, urine: NEGATIVE ng/mL
Zolpidem, Urine: NEGATIVE ng/mL
pH, Initial: 5.7 pH (ref 4.5–8.9)

## 2014-11-08 ENCOUNTER — Encounter: Payer: Self-pay | Admitting: Advanced Practice Midwife

## 2014-11-08 DIAGNOSIS — F1290 Cannabis use, unspecified, uncomplicated: Secondary | ICD-10-CM | POA: Insufficient documentation

## 2014-11-12 ENCOUNTER — Encounter: Payer: No Typology Code available for payment source | Admitting: Advanced Practice Midwife

## 2014-12-01 ENCOUNTER — Ambulatory Visit (INDEPENDENT_AMBULATORY_CARE_PROVIDER_SITE_OTHER): Payer: BLUE CROSS/BLUE SHIELD | Admitting: Advanced Practice Midwife

## 2014-12-01 VITALS — BP 101/64 | HR 78 | Wt 130.0 lb

## 2014-12-01 DIAGNOSIS — Z36 Encounter for antenatal screening of mother: Secondary | ICD-10-CM

## 2014-12-01 DIAGNOSIS — Z3492 Encounter for supervision of normal pregnancy, unspecified, second trimester: Secondary | ICD-10-CM

## 2014-12-01 MED ORDER — PRENATAL PLUS 27-1 MG PO TABS: 1.0000 | ORAL_TABLET | Freq: Every day | ORAL | Status: DC

## 2014-12-01 NOTE — Addendum Note (Signed)
Addended by: Granville LewisLARK, Tiburcio Linder L on: 12/01/2014 10:53 AM   Modules accepted: Orders

## 2014-12-01 NOTE — Progress Notes (Signed)
Doing well.  Denies vaginal bleeding, LOF, cramping/contractions.  Uncomfortable sleeping on left side only.  Discussed safe sleeping positions, recommend changing positions as needed for comfort.

## 2014-12-02 LAB — AFP, QUAD SCREEN
AFP: 49.8 ng/mL
Age Alone: 1:1050 {titer}
Curr Gest Age: 16.1 wks.days
HCG TOTAL: 44.87 [IU]/mL
INH: 365.9 pg/mL
INTERPRETATION-AFP: NEGATIVE
MoM for AFP: 1.18
MoM for INH: 1.98
MoM for hCG: 0.94
OPEN SPINA BIFIDA: NEGATIVE
TRI 18 SCR RISK EST: NEGATIVE
Trisomy 18 (Edward) Syndrome Interp.: 1:69600 {titer}
UE3 MOM: 0.93
uE3 Value: 0.82 ng/mL

## 2014-12-09 ENCOUNTER — Institutional Professional Consult (permissible substitution): Payer: No Typology Code available for payment source | Admitting: Physician Assistant

## 2014-12-09 DIAGNOSIS — R51 Headache: Secondary | ICD-10-CM

## 2014-12-10 ENCOUNTER — Encounter (HOSPITAL_COMMUNITY): Payer: Self-pay | Admitting: *Deleted

## 2014-12-10 ENCOUNTER — Inpatient Hospital Stay (HOSPITAL_COMMUNITY)
Admission: EM | Admit: 2014-12-10 | Discharge: 2014-12-10 | Disposition: A | Discharge status: Home / Self Care | Payer: BLUE CROSS/BLUE SHIELD | Source: Ambulatory Visit | Attending: Obstetrics and Gynecology | Admitting: Obstetrics and Gynecology

## 2014-12-10 DIAGNOSIS — Z3492 Encounter for supervision of normal pregnancy, unspecified, second trimester: Secondary | ICD-10-CM

## 2014-12-10 DIAGNOSIS — O210 Mild hyperemesis gravidarum: Secondary | ICD-10-CM | POA: Insufficient documentation

## 2014-12-10 DIAGNOSIS — Z3A17 17 weeks gestation of pregnancy: Secondary | ICD-10-CM | POA: Insufficient documentation

## 2014-12-10 DIAGNOSIS — O219 Vomiting of pregnancy, unspecified: Secondary | ICD-10-CM | POA: Diagnosis not present

## 2014-12-10 DIAGNOSIS — O9989 Other specified diseases and conditions complicating pregnancy, childbirth and the puerperium: Secondary | ICD-10-CM | POA: Diagnosis not present

## 2014-12-10 DIAGNOSIS — R0602 Shortness of breath: Secondary | ICD-10-CM | POA: Diagnosis not present

## 2014-12-10 LAB — CBC
HEMATOCRIT: 29 % — AB (ref 36.0–46.0)
HEMOGLOBIN: 10.6 g/dL — AB (ref 12.0–15.0)
MCH: 27.8 pg (ref 26.0–34.0)
MCHC: 36.6 g/dL — AB (ref 30.0–36.0)
MCV: 76.1 fL — ABNORMAL LOW (ref 78.0–100.0)
Platelets: 196 10*3/uL (ref 150–400)
RBC: 3.81 MIL/uL — AB (ref 3.87–5.11)
RDW: 12.5 % (ref 11.5–15.5)
WBC: 6.5 10*3/uL (ref 4.0–10.5)

## 2014-12-10 LAB — URINALYSIS, ROUTINE W REFLEX MICROSCOPIC
Bilirubin Urine: NEGATIVE
Glucose, UA: NEGATIVE mg/dL
Hgb urine dipstick: NEGATIVE
KETONES UR: 40 mg/dL — AB
LEUKOCYTES UA: NEGATIVE
Nitrite: NEGATIVE
PROTEIN: NEGATIVE mg/dL
Specific Gravity, Urine: 1.01 (ref 1.005–1.030)
UROBILINOGEN UA: 0.2 mg/dL (ref 0.0–1.0)
pH: 6 (ref 5.0–8.0)

## 2014-12-10 LAB — COMPREHENSIVE METABOLIC PANEL
ALK PHOS: 53 U/L (ref 39–117)
ALT: 12 U/L (ref 0–35)
ANION GAP: 9 (ref 5–15)
AST: 17 U/L (ref 0–37)
Albumin: 3.8 g/dL (ref 3.5–5.2)
BUN: 6 mg/dL (ref 6–23)
CO2: 21 mmol/L (ref 19–32)
Calcium: 8.8 mg/dL (ref 8.4–10.5)
Chloride: 105 mmol/L (ref 96–112)
Creatinine, Ser: 0.39 mg/dL — ABNORMAL LOW (ref 0.50–1.10)
GFR calc Af Amer: >90 mL/min (ref >90)
Glucose, Bld: 72 mg/dL (ref 70–99)
POTASSIUM: 3.4 mmol/L — AB (ref 3.5–5.1)
Sodium: 135 mmol/L (ref 135–145)
TOTAL PROTEIN: 6.8 g/dL (ref 6.0–8.3)
Total Bilirubin: 0.5 mg/dL (ref 0.3–1.2)

## 2014-12-10 MED ORDER — LACTATED RINGERS IV BOLUS (SEPSIS)
1000.0000 mL | Freq: Once | INTRAVENOUS | Status: AC
  Administered 2014-12-10: 1000 mL via INTRAVENOUS

## 2014-12-10 MED ORDER — PROMETHAZINE HCL 25 MG/ML IJ SOLN
25.0000 mg | Freq: Once | INTRAMUSCULAR | Status: AC
  Administered 2014-12-10: 25 mg via INTRAVENOUS
  Filled 2014-12-10: qty 1

## 2014-12-10 MED ORDER — FAMOTIDINE IN NACL 20-0.9 MG/50ML-% IV SOLN
20.0000 mg | Freq: Once | INTRAVENOUS | Status: AC
  Administered 2014-12-10: 20 mg via INTRAVENOUS
  Filled 2014-12-10: qty 50

## 2014-12-10 MED ORDER — PROMETHAZINE HCL 12.5 MG PO TABS: 12.5000 mg | ORAL_TABLET | Freq: Four times a day (QID) | ORAL | Status: DC | PRN

## 2014-12-10 MED ORDER — DEXTROSE IN LACTATED RINGERS 5 % IV SOLN
INTRAVENOUS | Status: DC
  Administered 2014-12-10: 13:00:00 via INTRAVENOUS

## 2014-12-10 NOTE — MAU Note (Signed)
Pt started feeling dizzy yesterday 12/09/2014. Vomiting this AM, trembling, pt states SOB.  Continuous nausea through pregnancy. Denies vaginal bleeding. Has Vaginal discharge.

## 2014-12-10 NOTE — MAU Note (Signed)
Pt states nausea is better. Feeling very tired from medication.  Pt encouraged to eat a small snack.

## 2014-12-10 NOTE — MAU Note (Signed)
Urine in lab 

## 2014-12-10 NOTE — MAU Provider Note (Signed)
History     CSN: [id number]  Arrival date and time: 12/10/14 1053   None     No chief complaint on file.  Shortness of Breath This is a new problem. The current episode started yesterday. The problem occurs constantly. The problem has been gradually worsening. Associated symptoms include headaches and vomiting. Pertinent negatives include no abdominal pain, chest pain, fever, sputum production, syncope or wheezing. The patient has no known risk factors for DVT/PE. She has tried nothing for the symptoms. Her past medical history is significant for allergies and asthma.     Julia Morgan is a 24 y.o. female G2P1001 at 26w3dwho presents with shortness of breath. She has a history of anxiety, however does not have a history of panic attacks. She cannot think of a reason why anxiety may be the reason for this. The only thing she can think of is that she has had nausea and vomiting for the past 24 hours; she has not eaten anything since yesterday morning. She has vomited twice in the last 24 hours however does not have an appetite. Her signifant other and child have had the stomach flu this week and she feels she may have it as well; she just does not feel well.   She has a history of asthma; rarely uses her rescue inhaler < 1 X per month. She does not feel this is asthma related.   OB History    Gravida Para Term Preterm AB TAB SAB Ectopic Multiple Living   _0 Past Medical History  Diagnosis Date  . Migraines   . Back pain   . Asthma   . PONV (postoperative nausea and vomiting)   . HSV-2 infection     History reviewed. No pertinent past surgical history.  Family History  Problem Relation Age of Onset  . Hyperlipidemia Mother   . Hypertension Mother   . Hyperlipidemia Maternal Grandmother   . Hypertension Maternal Grandmother   . Cancer - Cervical Paternal Grandmother     History  Substance Use Topics  . Smoking status: Never Smoker   . Smokeless  tobacco: Never Used  . Alcohol Use: 0.0 oz/week    0 Standard drinks or equivalent per week     Comment: heavy drinker prior to this pregnancy    Allergies: No Known Allergies  Prescriptions prior to admission  Medication Sig Dispense Refill Last Dose  . acetaminophen (TYLENOL) 500 MG tablet Take 1,000 mg by mouth every 6 (six) hours as needed for headache (Used for Migraines.).   Taking  . albuterol (PROVENTIL HFA;VENTOLIN HFA) 108 (90 BASE) MCG/ACT inhaler Inhale 1-2 puffs into the lungs every 6 (six) hours as needed for wheezing or shortness of breath. 1 Inhaler 0 Taking  . docusate sodium (COLACE) 100 MG capsule Take 100 mg by mouth daily.   Taking  . prenatal vitamin w/FE, FA (PRENATAL 1 + 1) 27-1 MG TABS tablet Take 1 tablet by mouth daily. 354each 10    Results for orders placed or performed during the hospital encounter of 12/10/14 (from the past 48 hour(s))  Urinalysis, Routine w reflex microscopic     Status: Abnormal   Collection Time: 12/10/14 11:00 AM  Result Value Ref Range   Color, Urine YELLOW YELLOW   APPearance CLEAR CLEAR   Specific Gravity, Urine 1.010 1.005 - 1.030   pH 6.0 5.0 - 8.0   Glucose, UA NEGATIVE  NEGATIVE mg/dL   Hgb urine dipstick NEGATIVE NEGATIVE   Bilirubin Urine NEGATIVE NEGATIVE   Ketones, ur 40 (A) NEGATIVE mg/dL   Protein, ur NEGATIVE NEGATIVE mg/dL   Urobilinogen, UA 0.2 0.0 - 1.0 mg/dL   Nitrite NEGATIVE NEGATIVE   Leukocytes, UA NEGATIVE NEGATIVE    Comment: MICROSCOPIC NOT DONE ON URINES WITH NEGATIVE PROTEIN, BLOOD, LEUKOCYTES, NITRITE, OR GLUCOSE <1000 mg/dL.  CBC     Status: Abnormal   Collection Time: 12/10/14 12:00 PM  Result Value Ref Range   WBC 6.5 4.0 - 10.5 K/uL   RBC 3.81 (L) 3.87 - 5.11 MIL/uL   Hemoglobin 10.6 (L) 12.0 - 15.0 g/dL   HCT 29.0 (L) 36.0 - 46.0 %   MCV 76.1 (L) 78.0 - 100.0 fL   MCH 27.8 26.0 - 34.0 pg   MCHC 36.6 (H) 30.0 - 36.0 g/dL   RDW 12.5 11.5 - 15.5 %   Platelets 196 150 - 400 K/uL  Comprehensive  metabolic panel     Status: Abnormal   Collection Time: 12/10/14 12:00 PM  Result Value Ref Range   Sodium 135 135 - 145 mmol/L   Potassium 3.4 (L) 3.5 - 5.1 mmol/L   Chloride 105 96 - 112 mmol/L   CO2 21 19 - 32 mmol/L   Glucose, Bld 72 70 - 99 mg/dL   BUN 6 6 - 23 mg/dL   Creatinine, Ser 0.39 (L) 0.50 - 1.10 mg/dL   Calcium 8.8 8.4 - 10.5 mg/dL   Total Protein 6.8 6.0 - 8.3 g/dL   Albumin 3.8 3.5 - 5.2 g/dL   AST 17 0 - 37 U/L   ALT 12 0 - 35 U/L   Alkaline Phosphatase 53 39 - 117 U/L   Total Bilirubin 0.5 0.3 - 1.2 mg/dL   GFR calc non Af Amer >90 >90 mL/min   GFR calc Af Amer >90 >90 mL/min    Comment: (NOTE) The eGFR has been calculated using the CKD EPI equation. This calculation has not been validated in all clinical situations. eGFR's persistently <90 mL/min signify possible Chronic Kidney Disease.    Anion gap 9 5 - 15     Review of Systems  Constitutional: Positive for malaise/fatigue. Negative for fever and chills.  Respiratory: Positive for shortness of breath. Negative for cough, sputum production and wheezing.   Cardiovascular: Negative for chest pain and syncope.  Gastrointestinal: Positive for vomiting. Negative for abdominal pain.  Neurological: Positive for dizziness and headaches.  Psychiatric/Behavioral: The patient is nervous/anxious.    Physical Exam   Blood pressure 101/76, pulse 113, temperature 97.8 F (36.6 C), temperature source Oral, resp. rate 20, height 5' 4.96" (1.65 m), weight 57.97 kg (127 lb 12.8 oz), last menstrual period 07/28/2014, SpO2 100 %.  Physical Exam  Constitutional: She is oriented to person, place, and time. Vital signs are normal. She appears well-developed and well-nourished.  Non-toxic appearance. She does not have a sickly appearance. She does not appear ill. No distress.  HENT:  Head: Normocephalic.  Eyes: Pupils are equal, round, and reactive to light.  Neck: Neck supple.  Cardiovascular: Normal rate.   Respiratory:  Effort normal and breath sounds normal. No respiratory distress. She has no wheezes. She has no rales.  Musculoskeletal: Normal range of motion.  Neurological: She is alert and oriented to person, place, and time.  Skin: Skin is warm.  Psychiatric: Her behavior is normal.    MAU Course  Procedures  None  MDM + fetal heart  tones.   LR bolus D5LR bolus  Phenergan  Pepcid   Patient received IV fluid's and was able to tolerate PO fluids and crackers. The patient got up to the bathroom without difficulty; states that the dizziness has subsided. Patient states she feels much better.   Assessment and Plan   A:  1. Supervision of normal pregnancy in second trimester   2. Shortness of breath during pregnancy   3. Nausea/vomiting in pregnancy    P:  Discharge home in stable condition BRAT diet Increase fluids Return to MAU if symptoms worsen  RX: Phenergan  Keep next OB appointment    Lezlie Lye, NP 12/10/2014 2:27 PM

## 2014-12-22 ENCOUNTER — Ambulatory Visit (HOSPITAL_COMMUNITY)
Admission: RE | Admit: 2014-12-22 | Discharge: 2014-12-22 | Disposition: A | Discharge status: Home / Self Care | Payer: BLUE CROSS/BLUE SHIELD | Source: Ambulatory Visit | Attending: Advanced Practice Midwife | Admitting: Advanced Practice Midwife

## 2014-12-22 ENCOUNTER — Other Ambulatory Visit: Payer: Self-pay | Admitting: Advanced Practice Midwife

## 2014-12-22 DIAGNOSIS — Z3492 Encounter for supervision of normal pregnancy, unspecified, second trimester: Secondary | ICD-10-CM

## 2014-12-22 DIAGNOSIS — Z3A19 19 weeks gestation of pregnancy: Secondary | ICD-10-CM | POA: Diagnosis not present

## 2014-12-22 DIAGNOSIS — Z36 Encounter for antenatal screening of mother: Secondary | ICD-10-CM | POA: Diagnosis not present

## 2014-12-22 DIAGNOSIS — Z3689 Encounter for other specified antenatal screening: Secondary | ICD-10-CM | POA: Insufficient documentation

## 2014-12-29 ENCOUNTER — Encounter: Payer: BLUE CROSS/BLUE SHIELD | Admitting: Advanced Practice Midwife

## 2015-01-01 ENCOUNTER — Encounter: Payer: BLUE CROSS/BLUE SHIELD | Admitting: Obstetrics & Gynecology

## 2015-01-15 ENCOUNTER — Ambulatory Visit (INDEPENDENT_AMBULATORY_CARE_PROVIDER_SITE_OTHER): Payer: BLUE CROSS/BLUE SHIELD | Admitting: Family Medicine

## 2015-01-15 VITALS — BP 112/66 | HR 80 | Wt 135.0 lb

## 2015-01-15 DIAGNOSIS — A6000 Herpesviral infection of urogenital system, unspecified: Secondary | ICD-10-CM

## 2015-01-15 DIAGNOSIS — Z3482 Encounter for supervision of other normal pregnancy, second trimester: Secondary | ICD-10-CM

## 2015-01-15 DIAGNOSIS — Z3492 Encounter for supervision of normal pregnancy, unspecified, second trimester: Secondary | ICD-10-CM

## 2015-01-15 NOTE — Patient Instructions (Signed)
Second Trimester of Pregnancy The second trimester is from week 13 through week 28, months 4 through 6. The second trimester is often a time when you feel your best. Your body has also adjusted to being pregnant, and you begin to feel better physically. Usually, morning sickness has lessened or quit completely, you may have more energy, and you may have an increase in appetite. The second trimester is also a time when the fetus is growing rapidly. At the end of the sixth month, the fetus is about 9 inches long and weighs about 1 pounds. You will likely begin to feel the baby move (quickening) between 18 and 20 weeks of the pregnancy. BODY CHANGES Your body goes through many changes during pregnancy. The changes vary from woman to woman.   Your weight will continue to increase. You will notice your lower abdomen bulging out.  You may begin to get stretch marks on your hips, abdomen, and breasts.  You may develop headaches that can be relieved by medicines approved by your health care provider.  You may urinate more often because the fetus is pressing on your bladder.  You may develop or continue to have heartburn as a result of your pregnancy.  You may develop constipation because certain hormones are causing the muscles that push waste through your intestines to slow down.  You may develop hemorrhoids or swollen, bulging veins (varicose veins).  You may have back pain because of the weight gain and pregnancy hormones relaxing your joints between the bones in your pelvis and as a result of a shift in weight and the muscles that support your balance.  Your breasts will continue to grow and be tender.  Your gums may bleed and may be sensitive to brushing and flossing.  Dark spots or blotches (chloasma, mask of pregnancy) may develop on your face. This will likely fade after the baby is born.  A dark line from your belly button to the pubic area (linea nigra) may appear. This will likely fade  after the baby is born.  You may have changes in your hair. These can include thickening of your hair, rapid growth, and changes in texture. Some women also have hair loss during or after pregnancy, or hair that feels dry or thin. Your hair will most likely return to normal after your baby is born. WHAT TO EXPECT AT YOUR PRENATAL VISITS During a routine prenatal visit:  You will be weighed to make sure you and the fetus are growing normally.  Your blood pressure will be taken.  Your abdomen will be measured to track your baby's growth.  The fetal heartbeat will be listened to.  Any test results from the previous visit will be discussed. Your health care provider may ask you:  How you are feeling.  If you are feeling the baby move.  If you have had any abnormal symptoms, such as leaking fluid, bleeding, severe headaches, or abdominal cramping.  If you have any questions. Other tests that may be performed during your second trimester include:  Blood tests that check for:  Low iron levels (anemia).  Gestational diabetes (between 24 and 28 weeks).  Rh antibodies.  Urine tests to check for infections, diabetes, or protein in the urine.  An ultrasound to confirm the proper growth and development of the baby.  An amniocentesis to check for possible genetic problems.  Fetal screens for spina bifida and Down syndrome. HOME CARE INSTRUCTIONS   Avoid all smoking, herbs, alcohol, and unprescribed   drugs. These chemicals affect the formation and growth of the baby.  Follow your health care provider's instructions regarding medicine use. There are medicines that are either safe or unsafe to take during pregnancy.  Exercise only as directed by your health care provider. Experiencing uterine cramps is a good sign to stop exercising.  Continue to eat regular, healthy meals.  Wear a good support bra for breast tenderness.  Do not use hot tubs, steam rooms, or saunas.  Wear your  seat belt at all times when driving.  Avoid raw meat, uncooked cheese, cat litter boxes, and soil used by cats. These carry germs that can cause birth defects in the baby.  Take your prenatal vitamins.  Try taking a stool softener (if your health care provider approves) if you develop constipation. Eat more high-fiber foods, such as fresh vegetables or fruit and whole grains. Drink plenty of fluids to keep your urine clear or pale yellow.  Take warm sitz baths to soothe any pain or discomfort caused by hemorrhoids. Use hemorrhoid cream if your health care provider approves.  If you develop varicose veins, wear support hose. Elevate your feet for 15 minutes, 3-4 times a day. Limit salt in your diet.  Avoid heavy lifting, wear low heel shoes, and practice good posture.  Rest with your legs elevated if you have leg cramps or low back pain.  Visit your dentist if you have not gone yet during your pregnancy. Use a soft toothbrush to brush your teeth and be gentle when you floss.  A sexual relationship may be continued unless your health care provider directs you otherwise.  Continue to go to all your prenatal visits as directed by your health care provider. SEEK MEDICAL CARE IF:   You have dizziness.  You have mild pelvic cramps, pelvic pressure, or nagging pain in the abdominal area.  You have persistent nausea, vomiting, or diarrhea.  You have a bad smelling vaginal discharge.  You have pain with urination. SEEK IMMEDIATE MEDICAL CARE IF:   You have a fever.  You are leaking fluid from your vagina.  You have spotting or bleeding from your vagina.  You have severe abdominal cramping or pain.  You have rapid weight gain or loss.  You have shortness of breath with chest pain.  You notice sudden or extreme swelling of your face, hands, ankles, feet, or legs.  You have not felt your baby move in over an hour.  You have severe headaches that do not go away with  medicine.  You have vision changes. Document Released: 08/16/2001 Document Revised: 08/27/2013 Document Reviewed: 10/23/2012 ExitCare Patient Information 2015 [employer]. This information is not intended to replace advice given to you by your health care provider. Make sure you discuss any questions you have with your health care provider.  

## 2015-01-15 NOTE — Progress Notes (Signed)
Anatomy u/s reviewed--incomplete will f/u Feels good movement--

## 2015-02-03 ENCOUNTER — Ambulatory Visit (HOSPITAL_COMMUNITY)
Admission: RE | Admit: 2015-02-03 | Discharge: 2015-02-03 | Disposition: A | Discharge status: Home / Self Care | Payer: BLUE CROSS/BLUE SHIELD | Source: Ambulatory Visit | Attending: Family Medicine | Admitting: Family Medicine

## 2015-02-03 DIAGNOSIS — Z3482 Encounter for supervision of other normal pregnancy, second trimester: Secondary | ICD-10-CM | POA: Diagnosis not present

## 2015-02-03 DIAGNOSIS — Z3492 Encounter for supervision of normal pregnancy, unspecified, second trimester: Secondary | ICD-10-CM

## 2015-02-11 ENCOUNTER — Ambulatory Visit (INDEPENDENT_AMBULATORY_CARE_PROVIDER_SITE_OTHER): Payer: BLUE CROSS/BLUE SHIELD | Admitting: Obstetrics & Gynecology

## 2015-02-11 ENCOUNTER — Encounter: Payer: Self-pay | Admitting: Obstetrics & Gynecology

## 2015-02-11 VITALS — BP 103/57 | HR 90 | Wt 133.0 lb

## 2015-02-11 DIAGNOSIS — Z3492 Encounter for supervision of normal pregnancy, unspecified, second trimester: Secondary | ICD-10-CM

## 2015-02-11 DIAGNOSIS — Z3482 Encounter for supervision of other normal pregnancy, second trimester: Secondary | ICD-10-CM

## 2015-02-11 NOTE — Progress Notes (Signed)
Routine visit. Good FM. No problems. Glucola, labs, tdap at next visit. 

## 2015-03-02 ENCOUNTER — Ambulatory Visit (INDEPENDENT_AMBULATORY_CARE_PROVIDER_SITE_OTHER): Payer: BLUE CROSS/BLUE SHIELD | Admitting: Obstetrics & Gynecology

## 2015-03-02 VITALS — BP 100/62 | HR 91 | Wt 141.0 lb

## 2015-03-02 DIAGNOSIS — Z23 Encounter for immunization: Secondary | ICD-10-CM | POA: Diagnosis not present

## 2015-03-02 DIAGNOSIS — Z3493 Encounter for supervision of normal pregnancy, unspecified, third trimester: Secondary | ICD-10-CM

## 2015-03-02 DIAGNOSIS — Z3492 Encounter for supervision of normal pregnancy, unspecified, second trimester: Secondary | ICD-10-CM

## 2015-03-02 DIAGNOSIS — Z3483 Encounter for supervision of other normal pregnancy, third trimester: Secondary | ICD-10-CM

## 2015-03-02 MED ORDER — TETANUS-DIPHTH-ACELL PERTUSSIS 5-2.5-18.5 LF-MCG/0.5 IM SUSP
0.5000 mL | Freq: Once | INTRAMUSCULAR | Status: AC
  Administered 2015-03-02: 0.5 mL via INTRAMUSCULAR

## 2015-03-02 MED ORDER — CETIRIZINE HCL 10 MG PO TABS: 10.0000 mg | ORAL_TABLET | Freq: Every day | ORAL | Status: DC

## 2015-03-02 NOTE — Progress Notes (Signed)
Subjective:  Julia Morgan is a 24 y.o. G2P1001 at 5064w1d being seen today for ongoing prenatal care.  Patient reports cough, runny nose--thinks it's allergies.  No fever.   Contractions: Irritability.  Vag. Bleeding: None. Movement: Present. Denies leaking of fluid.   The following portions of the patient's history were reviewed and updated as appropriate: allergies, current medications, past family history, past medical history, past social history, past surgical history and problem list.   Objective:   Filed Vitals:   03/02/15 1528  BP: 100/62  Pulse: 91  Weight: 141 lb (63.957 kg)    Fetal Status: Fetal Heart Rate (bpm): 150 Fundal Height: 28 cm Movement: Present     General:  Alert, oriented and cooperative. Patient is in no acute distress.  Skin: Skin is warm and dry. No rash noted.   Cardiovascular: Normal heart rate noted  Respiratory: Normal respiratory effort, no problems with respiration noted  Abdomen: Soft, gravid, appropriate for gestational age. Pain/Pressure: Present     Vaginal: Vag. Bleeding: None.    Vag D/C Character: Thin  Cervix: Not evaluated        Extremities: Normal range of motion.  Edema: Trace  Mental Status: Normal mood and affect. Normal behavior. Normal judgment and thought content.  Lungs:  CTAB Ears--right ear full of wax, Left ear--fluid Nose:  Swollen turbinates Throat:  clear Urinalysis: Urine Protein: Negative Urine Glucose: Negative  Assessment and Plan:  Pregnancy: G2P1001 at 7364w1d  1. Normal pregnancy, third trimester  - Glucose Tolerance, 1 HR (50g) - CBC - RPR - HIV antibody (with reflex) - Tdap (BOOSTRIX) injection 0.5 mL; Inject 0.5 mLs into the muscle once.  2. Allergic Rhinitis Zyrtec OTC Will add Flonase if still symptomatic in 2 weeks.    Preterm labor symptoms and general obstetric precautions including but not limited to vaginal bleeding, contractions, leaking of fluid and fetal movement were reviewed in detail with  the patient.  Please refer to After Visit Summary for other counseling recommendations.   Return in about 2 weeks (around 03/16/2015).   Lesly DukesKelly H Kaylee Trivett, MD

## 2015-03-02 NOTE — Patient Instructions (Signed)

## 2015-03-03 LAB — CBC
HEMATOCRIT: 31.7 % — AB (ref 36.0–46.0)
Hemoglobin: 10.9 g/dL — ABNORMAL LOW (ref 12.0–15.0)
MCH: 27.3 pg (ref 26.0–34.0)
MCHC: 34.4 g/dL (ref 30.0–36.0)
MCV: 79.4 fL (ref 78.0–100.0)
MPV: 11.4 fL (ref 8.6–12.4)
PLATELETS: 179 10*3/uL (ref 150–400)
RBC: 3.99 MIL/uL (ref 3.87–5.11)
RDW: 13.8 % (ref 11.5–15.5)
WBC: 10.2 10*3/uL (ref 4.0–10.5)

## 2015-03-03 LAB — HIV ANTIBODY (ROUTINE TESTING W REFLEX): HIV: NONREACTIVE

## 2015-03-03 LAB — GLUCOSE TOLERANCE, 1 HOUR (50G) W/O FASTING: Glucose, 1 Hour GTT: 133 mg/dL (ref 70–140)

## 2015-03-03 LAB — RPR

## 2015-03-10 ENCOUNTER — Inpatient Hospital Stay (HOSPITAL_COMMUNITY)
Admission: AD | Admit: 2015-03-10 | Discharge: 2015-03-10 | Disposition: A | Discharge status: Home / Self Care | Payer: BLUE CROSS/BLUE SHIELD | Source: Ambulatory Visit | Attending: Family Medicine | Admitting: Family Medicine

## 2015-03-10 ENCOUNTER — Encounter (HOSPITAL_COMMUNITY): Payer: Self-pay

## 2015-03-10 DIAGNOSIS — Z3A30 30 weeks gestation of pregnancy: Secondary | ICD-10-CM | POA: Insufficient documentation

## 2015-03-10 DIAGNOSIS — K5900 Constipation, unspecified: Secondary | ICD-10-CM | POA: Insufficient documentation

## 2015-03-10 DIAGNOSIS — R102 Pelvic and perineal pain: Secondary | ICD-10-CM | POA: Diagnosis not present

## 2015-03-10 DIAGNOSIS — N949 Unspecified condition associated with female genital organs and menstrual cycle: Secondary | ICD-10-CM | POA: Diagnosis not present

## 2015-03-10 DIAGNOSIS — R109 Unspecified abdominal pain: Secondary | ICD-10-CM | POA: Diagnosis present

## 2015-03-10 DIAGNOSIS — K5901 Slow transit constipation: Secondary | ICD-10-CM

## 2015-03-10 DIAGNOSIS — O9989 Other specified diseases and conditions complicating pregnancy, childbirth and the puerperium: Secondary | ICD-10-CM | POA: Insufficient documentation

## 2015-03-10 LAB — CBC WITH DIFFERENTIAL/PLATELET
Basophils Absolute: 0 10*3/uL (ref 0.0–0.1)
Basophils Relative: 0 % (ref 0–1)
EOS ABS: 0.3 10*3/uL (ref 0.0–0.7)
EOS PCT: 3 % (ref 0–5)
HCT: 28 % — ABNORMAL LOW (ref 36.0–46.0)
HEMOGLOBIN: 10.2 g/dL — AB (ref 12.0–15.0)
LYMPHS ABS: 1.5 10*3/uL (ref 0.7–4.0)
Lymphocytes Relative: 17 % (ref 12–46)
MCH: 27.4 pg (ref 26.0–34.0)
MCHC: 36.4 g/dL — ABNORMAL HIGH (ref 30.0–36.0)
MCV: 75.3 fL — ABNORMAL LOW (ref 78.0–100.0)
Monocytes Absolute: 0.6 10*3/uL (ref 0.1–1.0)
Monocytes Relative: 7 % (ref 3–12)
Neutro Abs: 6.2 10*3/uL (ref 1.7–7.7)
Neutrophils Relative %: 72 % (ref 43–77)
PLATELETS: 161 10*3/uL (ref 150–400)
RBC: 3.72 MIL/uL — AB (ref 3.87–5.11)
RDW: 12 % (ref 11.5–15.5)
WBC: 8.6 10*3/uL (ref 4.0–10.5)

## 2015-03-10 LAB — URINALYSIS, ROUTINE W REFLEX MICROSCOPIC
Bilirubin Urine: NEGATIVE
Glucose, UA: NEGATIVE mg/dL
HGB URINE DIPSTICK: NEGATIVE
Ketones, ur: 40 mg/dL — AB
Nitrite: NEGATIVE
PH: 7 (ref 5.0–8.0)
Protein, ur: NEGATIVE mg/dL
SPECIFIC GRAVITY, URINE: 1.015 (ref 1.005–1.030)
Urobilinogen, UA: 1 mg/dL (ref 0.0–1.0)

## 2015-03-10 LAB — URINE MICROSCOPIC-ADD ON

## 2015-03-10 NOTE — MAU Note (Signed)
Patient presents to MAU with c/o severe lower abdominal cramping that started this evening. Denies LOF or VB. +FM; FHR 154. Has not taken anything for the pain.

## 2015-03-10 NOTE — MAU Provider Note (Signed)
History     CSN: [id number]  Arrival date and time: 03/10/15 [id number]   None     Chief Complaint  Patient presents with  . Abdominal Cramping   HPI   Past Medical History  Diagnosis Date  . Migraines   . Back pain   . Asthma   . PONV (postoperative nausea and vomiting)   . HSV-2 infection     Past Surgical History  Procedure Laterality Date  . No past surgeries      Family History  Problem Relation Age of Onset  . Hyperlipidemia Mother   . Hypertension Mother   . Hyperlipidemia Maternal Grandmother   . Hypertension Maternal Grandmother   . Cancer - Cervical Paternal Grandmother     History  Substance Use Topics  . Smoking status: Never Smoker   . Smokeless tobacco: Never Used  . Alcohol Use: 0.0 oz/week    0 Standard drinks or equivalent per week     Comment: heavy drinker prior to this pregnancy    Allergies: No Known Allergies  Prescriptions prior to admission  Medication Sig Dispense Refill Last Dose  . acetaminophen (TYLENOL) 500 MG tablet Take 1,000 mg by mouth every 6 (six) hours as needed for headache (Used for Migraines.).   Past Month at Unknown time  . Docosahexaenoic Acid (DHA PO) Take 1 capsule by mouth daily.   03/09/2015 at Unknown time  . prenatal vitamin w/FE, FA (PRENATAL 1 + 1) 27-1 MG TABS tablet Take 1 tablet by mouth daily. 30 each 10 03/09/2015 at Unknown time  . albuterol (PROVENTIL HFA;VENTOLIN HFA) 108 (90 BASE) MCG/ACT inhaler Inhale 1-2 puffs into the lungs every 6 (six) hours as needed for wheezing or shortness of breath. 1 Inhaler 0 Rescue  . cetirizine (ZYRTEC ALLERGY) 10 MG tablet Take 1 tablet (10 mg total) by mouth daily. (Patient not taking: Reported on 03/10/2015) 30 tablet 3 Not Taking at Unknown time  . promethazine (PHENERGAN) 12.5 MG tablet Take 1 tablet (12.5 mg total) by mouth every 6 (six) hours as needed for nausea or vomiting. (Patient not taking: Reported on 03/10/2015) 30 tablet 0 Not Taking at Unknown time    ROS Physical  Exam   Blood pressure 111/82, pulse 97, temperature 98.2 F (36.8 C), temperature source Oral, resp. rate 18, weight 142 lb 3.2 oz (64.501 kg), last menstrual period 07/28/2014, SpO2 100 %.  Physical Exam  Constitutional: She is oriented to person, place, and time. She appears well-developed and well-nourished.  HENT:  Head: Normocephalic and atraumatic.  Cardiovascular: Normal rate and regular rhythm.  Exam reveals no gallop and no friction rub.   No murmur heard. Respiratory: Effort normal and breath sounds normal. No respiratory distress. She has no wheezes. She has no rales. She exhibits no tenderness.  GI: Soft. Bowel sounds are normal. She exhibits no distension and no mass. There is tenderness (right and left lower quadrant tenderness along round ligament). There is no rebound and no guarding.  Genitourinary: There is no rash, tenderness, lesion or injury on the right labia. There is no rash, tenderness, lesion or injury on the left labia. Uterus is enlarged (30 weeks gravid). Uterus is not deviated and not fixed. Cervix exhibits no motion tenderness, no discharge and no friability.  Hard stool felt in rectum through vaginal vault.  Musculoskeletal: Normal range of motion. She exhibits no edema or tenderness.  Neurological: She is alert and oriented to person, place, and time.  Skin: Skin is warm and dry.  Psychiatric: She has a normal mood and affect. Her behavior is normal. Judgment and thought content normal.   Dilation: Closed Effacement (%): 40 Station: -3 Exam by:: Dr. Adrian Blackwater   Results for orders placed or performed during the hospital encounter of 03/10/15 (from the past 24 hour(s))  Urinalysis, Routine w reflex microscopic (not at [facility])     Status: Abnormal   Collection Time: 03/10/15  8:46 PM  Result Value Ref Range   Color, Urine YELLOW YELLOW   APPearance CLEAR CLEAR   Specific Gravity, Urine 1.015 1.005 - 1.030   pH 7.0 5.0 - 8.0   Glucose, UA NEGATIVE NEGATIVE  mg/dL   Hgb urine dipstick NEGATIVE NEGATIVE   Bilirubin Urine NEGATIVE NEGATIVE   Ketones, ur 40 (A) NEGATIVE mg/dL   Protein, ur NEGATIVE NEGATIVE mg/dL   Urobilinogen, UA 1.0 0.0 - 1.0 mg/dL   Nitrite NEGATIVE NEGATIVE   Leukocytes, UA MODERATE (A) NEGATIVE  Urine microscopic-add on     Status: Abnormal   Collection Time: 03/10/15  8:46 PM  Result Value Ref Range   Squamous Epithelial / LPF FEW (A) RARE   WBC, UA 7-10 <3 WBC/hpf   RBC / HPF 0-2 <3 RBC/hpf   Bacteria, UA FEW (A) RARE   Urine-Other MUCOUS PRESENT   CBC with Differential/Platelet     Status: Abnormal   Collection Time: 03/10/15 10:05 PM  Result Value Ref Range   WBC 8.6 4.0 - 10.5 K/uL   RBC 3.72 (L) 3.87 - 5.11 MIL/uL   Hemoglobin 10.2 (L) 12.0 - 15.0 g/dL   HCT 16.1 (L) 09.6 - 04.5 %   MCV 75.3 (L) 78.0 - 100.0 fL   MCH 27.4 26.0 - 34.0 pg   MCHC 36.4 (H) 30.0 - 36.0 g/dL   RDW 40.9 81.1 - 91.4 %   Platelets 161 150 - 400 K/uL   Neutrophils Relative % 72 43 - 77 %   Neutro Abs 6.2 1.7 - 7.7 K/uL   Lymphocytes Relative 17 12 - 46 %   Lymphs Abs 1.5 0.7 - 4.0 K/uL   Monocytes Relative 7 3 - 12 %   Monocytes Absolute 0.6 0.1 - 1.0 K/uL   Eosinophils Relative 3 0 - 5 %   Eosinophils Absolute 0.3 0.0 - 0.7 K/uL   Basophils Relative 0 0 - 1 %   Basophils Absolute 0.0 0.0 - 0.1 K/uL   NST:  130s, moderate variability, multiple accels.  No decels  MAU Course  Procedures  MDM No evidence of appendicitis, preterm labor, PID.  Assessment and Plan  1.  Constipation  Colace  2.  Round ligament pain.  Tylenol  Support band  Return if pain worsens, develops fever, chills, nausea, vomiting.  3.  G2P1001 with IUP at [redacted]w[redacted]d NST reactive Follow up at next OB appt.  Quilla Freeze JEHIEL 03/10/2015, 9:59 PM

## 2015-03-10 NOTE — Discharge Instructions (Signed)

## 2015-03-17 ENCOUNTER — Ambulatory Visit (INDEPENDENT_AMBULATORY_CARE_PROVIDER_SITE_OTHER): Payer: BLUE CROSS/BLUE SHIELD | Admitting: Obstetrics & Gynecology

## 2015-03-17 VITALS — BP 98/57 | HR 78 | Wt 146.0 lb

## 2015-03-17 DIAGNOSIS — Z3482 Encounter for supervision of other normal pregnancy, second trimester: Secondary | ICD-10-CM

## 2015-03-17 DIAGNOSIS — Z3492 Encounter for supervision of normal pregnancy, unspecified, second trimester: Secondary | ICD-10-CM

## 2015-03-17 NOTE — Progress Notes (Signed)
Subjective:  Julia Morgan is a 24 y.o. G2P1001 at 1849w2d being seen today for ongoing prenatal care.  Patient reports no complaints.  Contractions: Irritability.  Vag. Bleeding: Scant. Movement: Present. Denies leaking of fluid.   The following portions of the patient's history were reviewed and updated as appropriate: allergies, current medications, past family history, past medical history, past social history, past surgical history and problem list.   Objective:   Filed Vitals:   03/17/15 1541  BP: 98/57  Pulse: 78  Weight: 146 lb (66.225 kg)    Fetal Status:   Fundal Height: 31 cm Movement: Present     General:  Alert, oriented and cooperative. Patient is in no acute distress.  Skin: Skin is warm and dry. No rash noted.   Cardiovascular: Normal heart rate noted  Respiratory: Normal respiratory effort, no problems with respiration noted  Abdomen: Soft, gravid, appropriate for gestational age. Pain/Pressure: Absent     Vaginal: Vag. Bleeding: Scant.    Vag D/C Character: Thin  Cervix: Not evaluated        Extremities: Normal range of motion.  Edema: Trace  Mental Status: Normal mood and affect. Normal behavior. Normal judgment and thought content.   Urinalysis: Urine Protein: Negative Urine Glucose: Negative  Assessment and Plan:  Pregnancy: G2P1001 at 1549w2d  1. Supervision of normal pregnancy in second trimester No issues    Preterm labor symptoms and general obstetric precautions including but not limited to vaginal bleeding, contractions, leaking of fluid and fetal movement were reviewed in detail with the patient.  Please refer to After Visit Summary for other counseling recommendations.   Return in about 2 weeks (around 03/31/2015).   Lesly DukesKelly H Latriece Anstine, MD

## 2015-03-25 ENCOUNTER — Telehealth: Payer: Self-pay | Admitting: *Deleted

## 2015-03-25 DIAGNOSIS — J45909 Unspecified asthma, uncomplicated: Secondary | ICD-10-CM

## 2015-03-25 DIAGNOSIS — O99519 Diseases of the respiratory system complicating pregnancy, unspecified trimester: Principal | ICD-10-CM

## 2015-03-25 MED ORDER — ALBUTEROL SULFATE HFA 108 (90 BASE) MCG/ACT IN AERS: 1.0000 | INHALATION_SPRAY | Freq: Four times a day (QID) | RESPIRATORY_TRACT | Status: DC | PRN

## 2015-03-25 NOTE — Telephone Encounter (Signed)
Pt called requesting a RF on her Albuterol inhaler .  Spoke with Dr Marice Potterove who stated that it was ok to use in pregnancy and RX was sent to her pharmacy.

## 2015-03-31 ENCOUNTER — Encounter (HOSPITAL_COMMUNITY): Payer: Self-pay

## 2015-03-31 ENCOUNTER — Ambulatory Visit (INDEPENDENT_AMBULATORY_CARE_PROVIDER_SITE_OTHER): Payer: BLUE CROSS/BLUE SHIELD | Admitting: Obstetrics & Gynecology

## 2015-03-31 ENCOUNTER — Inpatient Hospital Stay (HOSPITAL_COMMUNITY)
Admission: AD | Admit: 2015-03-31 | Discharge: 2015-03-31 | Disposition: A | Discharge status: Home / Self Care | Payer: BLUE CROSS/BLUE SHIELD | Source: Ambulatory Visit | Attending: Obstetrics and Gynecology | Admitting: Obstetrics and Gynecology

## 2015-03-31 VITALS — BP 111/63 | HR 87 | Wt 144.0 lb

## 2015-03-31 DIAGNOSIS — O9989 Other specified diseases and conditions complicating pregnancy, childbirth and the puerperium: Secondary | ICD-10-CM | POA: Diagnosis not present

## 2015-03-31 DIAGNOSIS — O26893 Other specified pregnancy related conditions, third trimester: Secondary | ICD-10-CM

## 2015-03-31 DIAGNOSIS — N898 Other specified noninflammatory disorders of vagina: Secondary | ICD-10-CM | POA: Diagnosis not present

## 2015-03-31 DIAGNOSIS — Z3A33 33 weeks gestation of pregnancy: Secondary | ICD-10-CM | POA: Diagnosis not present

## 2015-03-31 DIAGNOSIS — A6000 Herpesviral infection of urogenital system, unspecified: Secondary | ICD-10-CM | POA: Diagnosis present

## 2015-03-31 DIAGNOSIS — O42913 Preterm premature rupture of membranes, unspecified as to length of time between rupture and onset of labor, third trimester: Secondary | ICD-10-CM | POA: Diagnosis present

## 2015-03-31 DIAGNOSIS — F1290 Cannabis use, unspecified, uncomplicated: Secondary | ICD-10-CM | POA: Diagnosis present

## 2015-03-31 DIAGNOSIS — Z3483 Encounter for supervision of other normal pregnancy, third trimester: Secondary | ICD-10-CM

## 2015-03-31 LAB — AMNISURE RUPTURE OF MEMBRANE (ROM) NOT AT ARMC: Amnisure ROM: NEGATIVE

## 2015-03-31 MED ORDER — VALACYCLOVIR HCL 500 MG PO TABS: 500.0000 mg | ORAL_TABLET | Freq: Every day | ORAL | Status: DC

## 2015-03-31 MED ORDER — BECLOMETHASONE DIPROPIONATE 40 MCG/ACT IN AERS: 1.0000 | INHALATION_SPRAY | Freq: Two times a day (BID) | RESPIRATORY_TRACT | Status: DC

## 2015-03-31 NOTE — Discharge Instructions (Signed)
Third Trimester of Pregnancy The third trimester is from week 29 through week 42, months 7 through 9. The third trimester is a time when the fetus is growing rapidly. At the end of the ninth month, the fetus is about 20 inches in length and weighs 6-10 pounds.  BODY CHANGES Your body goes through many changes during pregnancy. The changes vary from woman to woman.   Your weight will continue to increase. You can expect to gain 25-35 pounds (11-16 kg) by the end of the pregnancy.  You may begin to get stretch marks on your hips, abdomen, and breasts.  You may urinate more often because the fetus is moving lower into your pelvis and pressing on your bladder.  You may develop or continue to have heartburn as a result of your pregnancy.  You may develop constipation because certain hormones are causing the muscles that push waste through your intestines to slow down.  You may develop hemorrhoids or swollen, bulging veins (varicose veins).  You may have pelvic pain because of the weight gain and pregnancy hormones relaxing your joints between the bones in your pelvis. Backaches may result from overexertion of the muscles supporting your posture.  You may have changes in your hair. These can include thickening of your hair, rapid growth, and changes in texture. Some women also have hair loss during or after pregnancy, or hair that feels dry or thin. Your hair will most likely return to normal after your baby is born.  Your breasts will continue to grow and be tender. A yellow discharge may leak from your breasts called colostrum.  Your belly button may stick out.  You may feel short of breath because of your expanding uterus.  You may notice the fetus "dropping," or moving lower in your abdomen.  You may have a bloody mucus discharge. This usually occurs a few days to a week before labor begins.  Your cervix becomes thin and soft (effaced) near your due date. WHAT TO EXPECT AT YOUR PRENATAL  EXAMS  You will have prenatal exams every 2 weeks until week 36. Then, you will have weekly prenatal exams. During a routine prenatal visit:  You will be weighed to make sure you and the fetus are growing normally.  Your blood pressure is taken.  Your abdomen will be measured to track your baby's growth.  The fetal heartbeat will be listened to.  Any test results from the previous visit will be discussed.  You may have a cervical check near your due date to see if you have effaced. At around 36 weeks, your caregiver will check your cervix. At the same time, your caregiver will also perform a test on the secretions of the vaginal tissue. This test is to determine if a type of bacteria, Group B streptococcus, is present. Your caregiver will explain this further. Your caregiver may ask you:  What your birth plan is.  How you are feeling.  If you are feeling the baby move.  If you have had any abnormal symptoms, such as leaking fluid, bleeding, severe headaches, or abdominal cramping.  If you have any questions. Other tests or screenings that may be performed during your third trimester include:  Blood tests that check for low iron levels (anemia).  Fetal testing to check the health, activity level, and growth of the fetus. Testing is done if you have certain medical conditions or if there are problems during the pregnancy. FALSE LABOR You may feel small, irregular contractions that   eventually go away. These are called Braxton Hicks contractions, or false labor. Contractions may last for hours, days, or even weeks before true labor sets in. If contractions come at regular intervals, intensify, or become painful, it is best to be seen by your caregiver.  SIGNS OF LABOR   Menstrual-like cramps.  Contractions that are 5 minutes apart or less.  Contractions that start on the top of the uterus and spread down to the lower abdomen and back.  A sense of increased pelvic pressure or back  pain.  A watery or bloody mucus discharge that comes from the vagina. If you have any of these signs before the 37th week of pregnancy, call your caregiver right away. You need to go to the hospital to get checked immediately. HOME CARE INSTRUCTIONS   Avoid all smoking, herbs, alcohol, and unprescribed drugs. These chemicals affect the formation and growth of the baby.  Follow your caregiver's instructions regarding medicine use. There are medicines that are either safe or unsafe to take during pregnancy.  Exercise only as directed by your caregiver. Experiencing uterine cramps is a good sign to stop exercising.  Continue to eat regular, healthy meals.  Wear a good support bra for breast tenderness.  Do not use hot tubs, steam rooms, or saunas.  Wear your seat belt at all times when driving.  Avoid raw meat, uncooked cheese, cat litter boxes, and soil used by cats. These carry germs that can cause birth defects in the baby.  Take your prenatal vitamins.  Try taking a stool softener (if your caregiver approves) if you develop constipation. Eat more high-fiber foods, such as fresh vegetables or fruit and whole grains. Drink plenty of fluids to keep your urine clear or pale yellow.  Take warm sitz baths to soothe any pain or discomfort caused by hemorrhoids. Use hemorrhoid cream if your caregiver approves.  If you develop varicose veins, wear support hose. Elevate your feet for 15 minutes, 3-4 times a day. Limit salt in your diet.  Avoid heavy lifting, wear low heal shoes, and practice good posture.  Rest a lot with your legs elevated if you have leg cramps or low back pain.  Visit your dentist if you have not gone during your pregnancy. Use a soft toothbrush to brush your teeth and be gentle when you floss.  A sexual relationship may be continued unless your caregiver directs you otherwise.  Do not travel far distances unless it is absolutely necessary and only with the approval  of your caregiver.  Take prenatal classes to understand, practice, and ask questions about the labor and delivery.  Make a trial run to the hospital.  Pack your hospital bag.  Prepare the baby's nursery.  Continue to go to all your prenatal visits as directed by your caregiver. SEEK MEDICAL CARE IF:  You are unsure if you are in labor or if your water has broken.  You have dizziness.  You have mild pelvic cramps, pelvic pressure, or nagging pain in your abdominal area.  You have persistent nausea, vomiting, or diarrhea.  You have a bad smelling vaginal discharge.  You have pain with urination. SEEK IMMEDIATE MEDICAL CARE IF:   You have a fever.  You are leaking fluid from your vagina.  You have spotting or bleeding from your vagina.  You have severe abdominal cramping or pain.  You have rapid weight loss or gain.  You have shortness of breath with chest pain.  You notice sudden or extreme swelling   of your face, hands, ankles, feet, or legs.  You have not felt your baby move in over an hour.  You have severe headaches that do not go away with medicine.  You have vision changes. Document Released: 08/16/2001 Document Revised: 08/27/2013 Document Reviewed: 10/23/2012 Exit[employer] Patient Information 2015 [employer]. This information is not intended to replace advice given to you by your health care provider. Make sure you discuss any questions you have with your health care provider.  

## 2015-03-31 NOTE — Progress Notes (Signed)
Subjective:  Julia Morgan is a 24 y.o. G2P1001 at [redacted]w[redacted]d being seen today for ongoing prenatal care.  Patient reports leakage of fluid, clear.  Contractions: Not present.  Vag. Bleeding: None. Movement: Present. Denies leaking of fluid.   The following portions of the patient's history were reviewed and updated as appropriate: allergies, current medications, past family history, past medical history, past social history, past surgical history and problem list.   Objective:   Filed Vitals:   03/31/15 1447  BP: 111/63  Pulse: 87  Weight: 144 lb (65.318 kg)    Fetal Status: Fetal Heart Rate (bpm): 150 Fundal Height: 32 cm Movement: Present     General:  Alert, oriented and cooperative. Patient is in no acute distress.  Skin: Skin is warm and dry. No rash noted.   Cardiovascular: Normal heart rate noted  Respiratory: Normal respiratory effort, no problems with respiration noted  Abdomen: Soft, gravid, appropriate for gestational age. Pain/Pressure: Present     Vaginal: Vag. Bleeding: None.    Vag D/C Character: Thin  Cervix: Not evaluated        Extremities: Normal range of motion.  Edema: Trace  Mental Status: Normal mood and affect. Normal behavior. Normal judgment and thought content.   Urinalysis: Urine Protein: Negative Urine Glucose: Negative  Assessment and Plan:  Pregnancy: G2P1001 at [redacted]w[redacted]d  1. Vaginal discharge Mildly nitrizine positive; microscope in office is not working well; Needs amniosure at hospitial.  Neg pooling.  - WET PREP BY MOLECULAR PROBE  2.  Asthma not controlled with resuce inhaler -Start Qvar 1 puff bid.  Increase to 2 puffs bid if necessary.  3.  HSV Start Valtrex prophylaxis.  Preterm labor symptoms and general obstetric precautions including but not limited to vaginal bleeding, contractions, leaking of fluid and fetal movement were reviewed in detail with the patient. Please refer to After Visit Summary for other counseling recommendations.   Return in about 2 weeks (around 04/14/2015).   Lesly Dukes, MD

## 2015-03-31 NOTE — Progress Notes (Signed)
Feels like she is leaking fluid. 

## 2015-03-31 NOTE — MAU Note (Signed)
Woke up this morning with underwear wet. Feels like leaking since then. Had routine OB appt today and was sent here for further eval. Denies vaginal bleeding/contractions. Positive fetal movement.

## 2015-03-31 NOTE — MAU Provider Note (Signed)
History   CSN: [id number] Arrival date and time: 03/31/15 1659  Chief Complaint  Patient presents with  . Rupture of Membranes   HPI  Patient is 24 y.o. G2P1001 [redacted]w[redacted]d here with concern for PPROM. She reports she had leaking fluid starting this morning. She has had vaginal discharge throughout pregnancy but this AM had water discharge that soaked through two pairs of underwear. She was assessed at [facility] office and nitazine was positive but unable to perform ferning due to broken microscope.  She was sent here for further assessment, specifically amiosure. Denies dysuria, polyuria. Denies itching/odor.   +FM, denies LOF, VB, contractions.   Past Medical History  Diagnosis Date  . Migraines   . Back pain   . Asthma   . PONV (postoperative nausea and vomiting)   . 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 No past surgeries      Family History  Problem Relation Age of Onset  . Hyperlipidemia Mother   . Hypertension Mother   . Hyperlipidemia Maternal Grandmother   . Hypertension Maternal Grandmother   . Cancer - Cervical Paternal Grandmother     History  Substance Use Topics  . Smoking status: Never Smoker   . Smokeless tobacco: Never Used  . Alcohol Use: No     Comment: heavy drinker prior to this pregnancy    Allergies: No Known Allergies  Prescriptions prior to admission  Medication Sig Dispense Refill Last Dose  . albuterol (PROVENTIL HFA;VENTOLIN HFA) 108 (90 BASE) MCG/ACT inhaler Inhale 1-2 puffs into the lungs every 6 (six) hours as needed for wheezing or shortness of breath. 1 Inhaler 0 03/30/2015 at Unknown time  . prenatal vitamin w/FE, FA (PRENATAL 1 + 1) 27-1 MG TABS tablet Take 1 tablet by mouth daily. 30 each 10 03/31/2015 at Unknown time  . beclomethasone (QVAR) 40 MCG/ACT inhaler Inhale 1 puff into the lungs 2 (two) times daily. (Patient not taking: Reported on 03/31/2015) 1 Inhaler 12   . valACYclovir  (VALTREX) 500 MG tablet Take 1 tablet (500 mg total) by mouth daily. Can increase to twice a day for 5 days in the event of a recurrence (Patient not taking: Reported on 03/31/2015) 30 tablet 12 Not Taking at Unknown time    Review of Systems  Constitutional: Negative for fever and chills.  Eyes: Negative for blurred vision and double vision.  Respiratory: Negative for cough and shortness of breath.   Cardiovascular: Negative for chest pain and orthopnea.  Gastrointestinal: Negative for nausea and vomiting.  Genitourinary: Negative for dysuria, frequency and flank pain.  Musculoskeletal: Negative for myalgias.  Skin: Negative for rash.  Neurological: Negative for dizziness, tingling, weakness and headaches.  Endo/Heme/Allergies: Does not bruise/bleed easily.  Psychiatric/Behavioral: Negative for depression and suicidal ideas. The patient is not nervous/anxious.    Physical Exam   Blood pressure 109/65, pulse 68, temperature 98.8 F (37.1 C), temperature source Oral, resp. rate 18, height 5\' 5"  (1.651 m), weight 143 lb 3.2 oz (64.955 kg), last menstrual period 07/28/2014, SpO2 97 %.  Physical Exam  Constitutional: She is oriented to person, place, and time. She appears well-developed and well-nourished. No distress.  Pregnant female  HENT:  Head: Normocephalic and atraumatic.  Eyes: Conjunctivae are normal. No scleral icterus.  Neck: Normal range of motion. Neck supple.  Cardiovascular: Normal rate and intact distal pulses.   Respiratory: Effort normal. She exhibits no tenderness.  GI: Soft.  There is no tenderness. There is no rebound and no guarding.  Gravid  Genitourinary: Vagina normal.  Visually closed.  Scant white discharge. No pooling.   Musculoskeletal: Normal range of motion. She exhibits no edema.  Neurological: She is alert and oriented to person, place, and time.  Skin: Skin is warm and dry. No rash noted.  Psychiatric: She has a normal mood and affect.    MAU Course   Procedures  MDM NST- appropriate for GA, reassuring Toco: quiet SSE: Negative pooling, negative ferning. Visually closed Amniosure NEGATIVE  Assessment and Plan   #Concern for PPROM/Vaginal Discharge: - nitrazine + at office.  Here she has no pooling or ferning. Amniosure ws sent given convincing story and was negative. Likely change in physiologic discharge.  - Discussed PTL precautions, patient and partner voiced understanding.  - Discharge home with follow up routine OB care  #FWB: NST was appropriate for gestational age and reassuring #GBS  weeks  Federico Flake  03/31/2015, 6:34 PM

## 2015-04-01 ENCOUNTER — Telehealth: Payer: Self-pay

## 2015-04-01 LAB — WET PREP BY MOLECULAR PROBE
Candida species: POSITIVE — AB
Gardnerella vaginalis: POSITIVE — AB
Trichomonas vaginosis: NEGATIVE

## 2015-04-01 MED ORDER — METRONIDAZOLE 500 MG PO TABS: 500.0000 mg | ORAL_TABLET | Freq: Two times a day (BID) | ORAL | Status: DC

## 2015-04-01 MED ORDER — TERCONAZOLE 0.8 % VA CREA: 1.0000 | TOPICAL_CREAM | Freq: Every day | VAGINAL | Status: AC

## 2015-04-01 NOTE — Telephone Encounter (Signed)
Patient labs final and she had yeast and bacterial vaginosis. Patient given Terazol 3 and Flagyl per protocol. Julia Stammer RN BSN

## 2015-04-14 ENCOUNTER — Encounter: Payer: Self-pay | Admitting: Obstetrics & Gynecology

## 2015-04-14 ENCOUNTER — Ambulatory Visit (INDEPENDENT_AMBULATORY_CARE_PROVIDER_SITE_OTHER): Payer: BLUE CROSS/BLUE SHIELD | Admitting: Obstetrics & Gynecology

## 2015-04-14 VITALS — BP 125/71 | HR 94 | Wt 148.0 lb

## 2015-04-14 DIAGNOSIS — Z3482 Encounter for supervision of other normal pregnancy, second trimester: Secondary | ICD-10-CM

## 2015-04-14 DIAGNOSIS — Z3492 Encounter for supervision of normal pregnancy, unspecified, second trimester: Secondary | ICD-10-CM

## 2015-04-14 NOTE — Progress Notes (Signed)
Subjective:  Julia Morgan is a 24 y.o. G2P1001 at [redacted]w[redacted]d being seen today for ongoing prenatal care.  Patient reports no complaints.  Contractions: Irritability.  Vag. Bleeding: None. Movement: Present. Denies leaking of fluid.   The following portions of the patient's history were reviewed and updated as appropriate: allergies, current medications, past family history, past medical history, past social history, past surgical history and problem list.   Objective:   Filed Vitals:   04/14/15 1526  BP: 125/71  Pulse: 94  Weight: 148 lb (67.132 kg)    Fetal Status: Fetal Heart Rate (bpm): 145   Movement: Present     General:  Alert, oriented and cooperative. Patient is in no acute distress.  Skin: Skin is warm and dry. No rash noted.   Cardiovascular: Normal heart rate noted  Respiratory: Normal respiratory effort, no problems with respiration noted  Abdomen: Soft, gravid, appropriate for gestational age. Pain/Pressure: Present     Pelvic: Vag. Bleeding: None Vag D/C Character: Thin   Cervical exam deferred        Extremities: Normal range of motion.  Edema: Trace  Mental Status: Normal mood and affect. Normal behavior. Normal judgment and thought content.   Urinalysis: Urine Protein: Trace Urine Glucose: Negative  Assessment and Plan:  Pregnancy: G2P1001 at [redacted]w[redacted]d  1. Supervision of normal pregnancy in second trimester Cultures at next visit  Preterm labor symptoms and general obstetric precautions including but not limited to vaginal bleeding, contractions, leaking of fluid and fetal movement were reviewed in detail with the patient. Please refer to After Visit Summary for other counseling recommendations.  Return in about 1 week (around 04/21/2015).   Allie Bossier, MD

## 2015-04-21 ENCOUNTER — Ambulatory Visit (INDEPENDENT_AMBULATORY_CARE_PROVIDER_SITE_OTHER): Payer: BLUE CROSS/BLUE SHIELD | Admitting: Obstetrics & Gynecology

## 2015-04-21 ENCOUNTER — Other Ambulatory Visit: Payer: Self-pay | Admitting: Obstetrics & Gynecology

## 2015-04-21 VITALS — BP 107/66 | HR 93 | Wt 144.0 lb

## 2015-04-21 DIAGNOSIS — O26843 Uterine size-date discrepancy, third trimester: Secondary | ICD-10-CM

## 2015-04-21 DIAGNOSIS — Z3490 Encounter for supervision of normal pregnancy, unspecified, unspecified trimester: Secondary | ICD-10-CM

## 2015-04-21 DIAGNOSIS — Z3A36 36 weeks gestation of pregnancy: Secondary | ICD-10-CM

## 2015-04-21 DIAGNOSIS — Z36 Encounter for antenatal screening of mother: Secondary | ICD-10-CM

## 2015-04-21 DIAGNOSIS — Z3492 Encounter for supervision of normal pregnancy, unspecified, second trimester: Secondary | ICD-10-CM

## 2015-04-21 DIAGNOSIS — Z3483 Encounter for supervision of other normal pregnancy, third trimester: Secondary | ICD-10-CM

## 2015-04-21 LAB — OB RESULTS CONSOLE GBS: STREP GROUP B AG: NEGATIVE

## 2015-04-21 LAB — OB RESULTS CONSOLE GC/CHLAMYDIA
CHLAMYDIA, DNA PROBE: NEGATIVE
GC PROBE AMP, GENITAL: NEGATIVE

## 2015-04-21 NOTE — Progress Notes (Signed)
Subjective:  Julia Morgan is a 24 y.o. G2P1001 at [redacted]w[redacted]d being seen today for ongoing prenatal care.  Patient reports no complaints.  Contractions: Irritability.  Vag. Bleeding: None. Movement: Present. Denies leaking of fluid.   The following portions of the patient's history were reviewed and updated as appropriate: allergies, current medications, past family history, past medical history, past social history, past surgical history and problem list.   Objective:   Filed Vitals:   04/21/15 1530  BP: 107/66  Pulse: 93  Weight: 144 lb (65.318 kg)    Fetal Status: Fetal Heart Rate (bpm): 143   Movement: Present     General:  Alert, oriented and cooperative. Patient is in no acute distress.  Skin: Skin is warm and dry. No rash noted.   Cardiovascular: Normal heart rate noted  Respiratory: Normal respiratory effort, no problems with respiration noted  Abdomen: Soft, gravid, appropriate for gestational age. Pain/Pressure: Present     Pelvic: Vag. Bleeding: None Vag D/C Character: Mucous   Cervical exam performed        Extremities: Normal range of motion.  Edema: Trace  Mental Status: Normal mood and affect. Normal behavior. Normal judgment and thought content.   Urinalysis: Urine Protein: Negative Urine Glucose: Negative  Assessment and Plan:  Pregnancy: G2P1001 at [redacted]w[redacted]d  1. Normal pregnancy, unspecified trimester Size less than dates--check [place] for growth. - Culture, beta strep (group b only) - GC/Chlamydia Probe Amp - US OB Follow Up; Future  Term labor symptoms and general obstetric precautions including but not limited to vaginal bleeding, contractions, leaking of fluid and fetal movement were reviewed in detail with the patient. Please refer to After Visit Summary for other counseling recommendations.  No Follow-up on file.   Lesly Dukes, MD

## 2015-04-22 ENCOUNTER — Telehealth: Payer: Self-pay | Admitting: *Deleted

## 2015-04-22 ENCOUNTER — Inpatient Hospital Stay (HOSPITAL_COMMUNITY)
Admission: AD | Admit: 2015-04-22 | Discharge: 2015-04-22 | Disposition: A | Discharge status: Home / Self Care | Payer: BLUE CROSS/BLUE SHIELD | Source: Ambulatory Visit | Attending: Family Medicine | Admitting: Family Medicine

## 2015-04-22 ENCOUNTER — Encounter (HOSPITAL_COMMUNITY): Payer: Self-pay

## 2015-04-22 ENCOUNTER — Other Ambulatory Visit: Payer: Self-pay | Admitting: Obstetrics & Gynecology

## 2015-04-22 DIAGNOSIS — O4693 Antepartum hemorrhage, unspecified, third trimester: Secondary | ICD-10-CM | POA: Insufficient documentation

## 2015-04-22 DIAGNOSIS — Z3A36 36 weeks gestation of pregnancy: Secondary | ICD-10-CM | POA: Diagnosis not present

## 2015-04-22 DIAGNOSIS — O4703 False labor before 37 completed weeks of gestation, third trimester: Secondary | ICD-10-CM

## 2015-04-22 LAB — GC/CHLAMYDIA PROBE AMP
CT PROBE, AMP APTIMA: NEGATIVE
GC Probe RNA: NEGATIVE

## 2015-04-22 MED ORDER — ONDANSETRON HCL 4 MG PO TABS: 4.0000 mg | ORAL_TABLET | Freq: Three times a day (TID) | ORAL | Status: DC | PRN

## 2015-04-22 NOTE — MAU Provider Note (Signed)
History     CSN: [id number]  Arrival date and time: 04/22/15 1052    Chief Complaint  Patient presents with  . Vaginal Bleeding  . Contractions   HPI  Patient is 24 y.o. [id number] [redacted]w[redacted]d here with complaints of vaginal bleeding.  Julia Morgan reports that she had a cervical check at her regularly scheduled prenatal appointment yesterday, and began to experience vaginal bleeding afterwards. The blood was bright red yesterday, and very heavy flow. She soaked through a regular pad in an hour. Today the blood has turned dark brown with clots. She said the flow is slower, soaking through a panty liner every 2-3 hours. She also reports increased mucinous discharge. She does not think she has had any gushes of fluid, and endorses fetal movement.  She has also been experiencing contractions for the past couple days, but says they are irregular. She is not currently concerned about the contractions and is managing the pain well.  Of note, she was told she was dilated 2 cm yesterday.    Past Medical History  Diagnosis Date  . Migraines   . Back pain   . Asthma   . PONV (postoperative nausea and vomiting)   . 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 No past surgeries      Family History  Problem Relation Age of Onset  . Hyperlipidemia Mother   . Hypertension Mother   . Hyperlipidemia Maternal Grandmother   . Hypertension Maternal Grandmother   . Cancer - Cervical Paternal Grandmother     Social History  Substance Use Topics  . Smoking status: Never Smoker   . Smokeless tobacco: Never Used  . Alcohol Use: No     Comment: heavy drinker prior to this pregnancy    Allergies: No Known Allergies  Prescriptions prior to admission  Medication Sig Dispense Refill Last Dose  . acetaminophen (TYLENOL) 500 MG tablet Take 1,000 mg by mouth every 6 (six) hours as needed for mild pain, moderate pain or headache.   04/19/2015  .  beclomethasone (QVAR) 40 MCG/ACT inhaler Inhale 1 puff into the lungs 2 (two) times daily. 1 Inhaler 12 04/21/2015 at Unknown time  . prenatal vitamin w/FE, FA (PRENATAL 1 + 1) 27-1 MG TABS tablet Take 1 tablet by mouth daily. 30 each 10 04/21/2015 at Unknown time  . valACYclovir (VALTREX) 500 MG tablet Take 1 tablet (500 mg total) by mouth daily. Can increase to twice a day for 5 days in the event of a recurrence 30 tablet 12 04/21/2015 at Unknown time  . albuterol (PROVENTIL HFA;VENTOLIN HFA) 108 (90 BASE) MCG/ACT inhaler Inhale 1-2 puffs into the lungs every 6 (six) hours as needed for wheezing or shortness of breath. 1 Inhaler 0 04/19/2015  . ondansetron (ZOFRAN) 4 MG tablet Take 1 tablet (4 mg total) by mouth every 8 (eight) hours as needed for nausea or vomiting. (Patient not taking: Reported on 04/22/2015) 30 tablet 0     Review of Systems  Respiratory: Negative for shortness of breath.   Cardiovascular: Negative for chest pain.  Genitourinary: Negative for dysuria and urgency.       Vaginal bleeding - dark brown blood with clots Mucinous discharge  Reports irregular contractions    Physical Exam   Blood pressure 117/77, pulse 91, temperature 97.6 F (36.4 C), temperature source Oral, resp. rate 16, height 5\' 5"  (1.651 m), weight 146 lb (66.225 kg), last menstrual  period 07/28/2014.  Physical Exam  Constitutional: She is oriented to person, place, and time. She appears well-developed and well-nourished. No distress.  HENT:  Head: Normocephalic and atraumatic.  Eyes: EOM are normal.  Cardiovascular: Normal rate, regular rhythm and normal heart sounds.   No murmur heard. Respiratory: Effort normal and breath sounds normal. No respiratory distress. She has no wheezes.  GI: There is no tenderness.  Gravid  Genitourinary: Vaginal discharge found.  No blood on speculum exam  Neurological: She is alert and oriented to person, place, and time. No cranial nerve deficit.  Psychiatric: She  has a normal mood and affect.    MAU Course  Procedures None  MDM Reviewed patient's charts Speculum exam - no blood, some mucous Cervical check - dilated 1 cm  Assessment and Plan   A: Patient is 24 y.o. G2P1001 [redacted]w[redacted]d reporting vaginal bleeding likely secondary to trauma after cervical exam. No blood seen on speculum exam, and cervix dilated only 1 cm.   P: Discharge home - Reviewed findings and my conclusion - Preterm labor precautions dicussed - Follow-up with OB provider   Julia Morgan 04/22/2015, 12:59 PM   OB FELLOW MAU DISCHARGE ATTESTATION  I have seen and examined this patient; I agree with above documentation in the resident's note.   Julia Morgan is a 24 y.o. G2P1001 reporting spotting after cervical exam yesterday.  +FM, denies LOF, VB, contractions, vaginal discharge.  PE: BP 117/77 mmHg  Pulse 91  Temp(Src) 97.6 F (36.4 C) (Oral)  Resp 16  Ht  (1.651 m)  Wt 146 lb (66.225 kg)  BMI 24.30 kg/m2  LMP 07/28/2014 (Exact Date) Gen: calm comfortable, NAD Resp: normal effort, no distress Abd: gravid Fingertip/thick/high  ROS, labs, PMH reviewed NST reactive   A/P: VAginal spotting after cervical exam yesterday. No blood on speculum exam, reactive nst, no regular contractions, cervix closed. No signs/symptoms PPROM. Think this likely 2/2 exam; may reflect early latent labor. Home w/ PPROM and PTL precautions and clinic f/u next week.  Julia Bilis, MD 6:42 PM

## 2015-04-22 NOTE — Telephone Encounter (Signed)
Pt called in stating she has had vaginal bleeding since cervical check yesterday. States bleeding has increased throughout the night and now filling a pad. States blood is not bright red but brown color. She has been having contractions but "nothing concerning" and she is feeling fetal movement. Adv pt that since bleeding has increased to go to MAU for eval. Pt expressed understanding.

## 2015-04-22 NOTE — Discharge Instructions (Signed)
It seems that your vaginal bleeding is probably due mostly to your cervical exam yesterday. If the bleeding becomes very heavy, please return. Otherwise, be sure to attend your already scheduled prenatal appointment next week.

## 2015-04-22 NOTE — MAU Note (Signed)
Patient states she goes to [facility] for prenatal care, had cervical check yesterday and has had bleeding ever since, having contractions.

## 2015-04-23 LAB — CULTURE, BETA STREP (GROUP B ONLY)

## 2015-04-24 ENCOUNTER — Other Ambulatory Visit: Payer: Self-pay | Admitting: Obstetrics & Gynecology

## 2015-04-24 ENCOUNTER — Ambulatory Visit (HOSPITAL_COMMUNITY)
Admission: RE | Admit: 2015-04-24 | Discharge: 2015-04-24 | Disposition: A | Discharge status: Home / Self Care | Payer: BLUE CROSS/BLUE SHIELD | Source: Ambulatory Visit | Attending: Obstetrics & Gynecology | Admitting: Obstetrics & Gynecology

## 2015-04-24 DIAGNOSIS — Z3A36 36 weeks gestation of pregnancy: Secondary | ICD-10-CM

## 2015-04-24 DIAGNOSIS — O4103X1 Oligohydramnios, third trimester, fetus 1: Secondary | ICD-10-CM

## 2015-04-24 DIAGNOSIS — O26843 Uterine size-date discrepancy, third trimester: Secondary | ICD-10-CM

## 2015-04-25 DIAGNOSIS — O4103X0 Oligohydramnios, third trimester, not applicable or unspecified: Secondary | ICD-10-CM | POA: Insufficient documentation

## 2015-04-25 NOTE — Addendum Note (Signed)
Encounter addended by: Lesly Dukes, MD on: 04/25/2015  5:50 PM<BR>     Documentation filed: Problem List

## 2015-04-27 ENCOUNTER — Ambulatory Visit (INDEPENDENT_AMBULATORY_CARE_PROVIDER_SITE_OTHER): Payer: BLUE CROSS/BLUE SHIELD | Admitting: Advanced Practice Midwife

## 2015-04-27 VITALS — BP 109/65 | HR 70 | Wt 146.0 lb

## 2015-04-27 DIAGNOSIS — O365930 Maternal care for other known or suspected poor fetal growth, third trimester, not applicable or unspecified: Secondary | ICD-10-CM

## 2015-04-27 DIAGNOSIS — Z3483 Encounter for supervision of other normal pregnancy, third trimester: Secondary | ICD-10-CM

## 2015-04-27 DIAGNOSIS — O4103X1 Oligohydramnios, third trimester, fetus 1: Secondary | ICD-10-CM

## 2015-04-27 NOTE — Progress Notes (Signed)
Subjective:  Zivah Mayr is a 24 y.o. G2P1001 at [redacted]w[redacted]d being seen today for ongoing prenatal care.  Patient reports no complaints.  Contractions: Irritability.  Vag. Bleeding: None. Movement: Present. Denies leaking of fluid.   The following portions of the patient's history were reviewed and updated as appropriate: allergies, current medications, past family history, past medical history, past social history, past surgical history and problem list.   Objective:   Filed Vitals:   04/27/15 0922  BP: 109/65  Pulse: 70  Weight: 146 lb (66.225 kg)    Fetal Status: Fetal Heart Rate (bpm): 130 Fundal Height: 34 cm Movement: Present     General:  Alert, oriented and cooperative. Patient is in no acute distress.  Skin: Skin is warm and dry. No rash noted.   Cardiovascular: Normal heart rate noted  Respiratory: Normal respiratory effort, no problems with respiration noted  Abdomen: Soft, gravid, appropriate for gestational age. Pain/Pressure: Present     Pelvic: Vag. Bleeding: None Vag D/C Character: Mucous   Cervical exam deferred        Extremities: Normal range of motion.  Edema: Trace  Mental Status: Normal mood and affect. Normal behavior. Normal judgment and thought content.   Urinalysis: Urine Protein: Negative Urine Glucose: Negative  Assessment and Plan:  Pregnancy: G2P1001 at [redacted]w[redacted]d  1. Oligohydramnios antepartum, third trimester, fetus 1 [place] ordered at previous visit r/t size less than dates.  EFW wnl but borderline oligo at [place] with MFM recommendation to repeat AFI and BPP in 1 week. - [place] MFM FETAL BPP WO NON STRESS; Future - US OB Limited; Future  Term labor symptoms and general obstetric precautions including but not limited to vaginal bleeding, contractions, leaking of fluid and fetal movement were reviewed in detail with the patient. Please refer to After Visit Summary for other counseling recommendations.  Return in about 1 week (around 05/04/2015).   Hurshel Party, CNM

## 2015-04-27 NOTE — Patient Instructions (Signed)

## 2015-05-01 ENCOUNTER — Ambulatory Visit (HOSPITAL_COMMUNITY)
Admission: RE | Admit: 2015-05-01 | Discharge: 2015-05-01 | Disposition: A | Discharge status: Home / Self Care | Payer: BLUE CROSS/BLUE SHIELD | Source: Ambulatory Visit | Attending: Advanced Practice Midwife | Admitting: Advanced Practice Midwife

## 2015-05-01 DIAGNOSIS — Z3A00 Weeks of gestation of pregnancy not specified: Secondary | ICD-10-CM | POA: Diagnosis not present

## 2015-05-01 DIAGNOSIS — O4100X0 Oligohydramnios, unspecified trimester, not applicable or unspecified: Secondary | ICD-10-CM | POA: Diagnosis not present

## 2015-05-01 DIAGNOSIS — O4103X1 Oligohydramnios, third trimester, fetus 1: Secondary | ICD-10-CM

## 2015-05-04 ENCOUNTER — Ambulatory Visit (INDEPENDENT_AMBULATORY_CARE_PROVIDER_SITE_OTHER): Payer: BLUE CROSS/BLUE SHIELD | Admitting: Advanced Practice Midwife

## 2015-05-04 VITALS — BP 121/58 | HR 75 | Wt 151.0 lb

## 2015-05-04 DIAGNOSIS — O4103X1 Oligohydramnios, third trimester, fetus 1: Secondary | ICD-10-CM

## 2015-05-04 DIAGNOSIS — Z3483 Encounter for supervision of other normal pregnancy, third trimester: Secondary | ICD-10-CM

## 2015-05-04 NOTE — Patient Instructions (Signed)
Labor precautions: Reasons to go to MAU:  1.  Contractions are  5 minutes apart or less, each last 1 minute, these have been going on for 1-2 hours, and you cannot walk or talk during them 2.  You have a large gush of fluid, or a trickle of fluid that will not stop and you have to wear a pad 3.  You have bleeding that is bright red, heavier than spotting--like menstrual bleeding (spotting can be normal in early labor or after a check of your cervix) 4.  You do not feel the baby moving like he/she normally does

## 2015-05-04 NOTE — Progress Notes (Signed)
Subjective:  Julia Morgan is a 24 y.o. G2P1001 at [redacted]w[redacted]d being seen today for ongoing prenatal care.  Patient reports no complaints.  Contractions: Irregular.  Vag. Bleeding: None. Movement: Present. Denies leaking of fluid.   The following portions of the patient's history were reviewed and updated as appropriate: allergies, current medications, past family history, past medical history, past social history, past surgical history and problem list.   Objective:   Filed Vitals:   05/04/15 0903  BP: 121/58  Pulse: 75  Weight: 151 lb (68.493 kg)    Fetal Status: Fetal Heart Rate (bpm): 148 Fundal Height: 36 cm Movement: Present  Presentation: Vertex  General:  Alert, oriented and cooperative. Patient is in no acute distress.  Skin: Skin is warm and dry. No rash noted.   Cardiovascular: Normal heart rate noted  Respiratory: Normal respiratory effort, no problems with respiration noted  Abdomen: Soft, gravid, appropriate for gestational age. Pain/Pressure: Present     Pelvic: Vag. Bleeding: None Vag D/C Character: Thin   Cervical exam performed Dilation: 3 Effacement (%): 50 Station: -3  Extremities: Normal range of motion.  Edema: Trace  Mental Status: Normal mood and affect. Normal behavior. Normal judgment and thought content.   Urinalysis: Urine Protein: Negative Urine Glucose: Negative  Assessment and Plan:  Pregnancy: G2P1001 at [redacted]w[redacted]d  1. Oligohydramnios without rupture of membranes in third trimester, fetus 1 Resolved on [place] 05/01/15, with BPP 8/8  Term labor symptoms and general obstetric precautions including but not limited to vaginal bleeding, contractions, leaking of fluid and fetal movement were reviewed in detail with the patient. Please refer to After Visit Summary for other counseling recommendations.  Return in about 1 week (around 05/11/2015).   Hurshel Party, CNM

## 2015-05-05 ENCOUNTER — Inpatient Hospital Stay (HOSPITAL_COMMUNITY): Payer: BLUE CROSS/BLUE SHIELD | Admitting: Anesthesiology

## 2015-05-05 ENCOUNTER — Inpatient Hospital Stay (HOSPITAL_COMMUNITY)
Admission: AD | Admit: 2015-05-05 | Discharge: 2015-05-07 | DRG: 774 | Disposition: A | Discharge status: Home / Self Care | Payer: BLUE CROSS/BLUE SHIELD | Source: Ambulatory Visit | Attending: Obstetrics & Gynecology | Admitting: Obstetrics & Gynecology

## 2015-05-05 ENCOUNTER — Encounter (HOSPITAL_COMMUNITY): Payer: Self-pay | Admitting: *Deleted

## 2015-05-05 ENCOUNTER — Encounter: Payer: Self-pay | Admitting: *Deleted

## 2015-05-05 DIAGNOSIS — N898 Other specified noninflammatory disorders of vagina: Secondary | ICD-10-CM

## 2015-05-05 DIAGNOSIS — O99324 Drug use complicating childbirth: Secondary | ICD-10-CM | POA: Diagnosis present

## 2015-05-05 DIAGNOSIS — O9832 Other infections with a predominantly sexual mode of transmission complicating childbirth: Secondary | ICD-10-CM | POA: Diagnosis present

## 2015-05-05 DIAGNOSIS — O4103X0 Oligohydramnios, third trimester, not applicable or unspecified: Secondary | ICD-10-CM | POA: Diagnosis present

## 2015-05-05 DIAGNOSIS — F1290 Cannabis use, unspecified, uncomplicated: Secondary | ICD-10-CM | POA: Diagnosis present

## 2015-05-05 DIAGNOSIS — Z3A38 38 weeks gestation of pregnancy: Secondary | ICD-10-CM | POA: Diagnosis present

## 2015-05-05 DIAGNOSIS — A6000 Herpesviral infection of urogenital system, unspecified: Secondary | ICD-10-CM

## 2015-05-05 DIAGNOSIS — O26893 Other specified pregnancy related conditions, third trimester: Secondary | ICD-10-CM

## 2015-05-05 DIAGNOSIS — Z8249 Family history of ischemic heart disease and other diseases of the circulatory system: Secondary | ICD-10-CM | POA: Diagnosis not present

## 2015-05-05 DIAGNOSIS — Z3492 Encounter for supervision of normal pregnancy, unspecified, second trimester: Secondary | ICD-10-CM

## 2015-05-05 DIAGNOSIS — O4103X1 Oligohydramnios, third trimester, fetus 1: Secondary | ICD-10-CM

## 2015-05-05 LAB — CBC
HEMATOCRIT: 35.3 % — AB (ref 36.0–46.0)
HEMOGLOBIN: 12.7 g/dL (ref 12.0–15.0)
MCH: 27.5 pg (ref 26.0–34.0)
MCHC: 36 g/dL (ref 30.0–36.0)
MCV: 76.6 fL — AB (ref 78.0–100.0)
Platelets: 146 10*3/uL — ABNORMAL LOW (ref 150–400)
RBC: 4.61 MIL/uL (ref 3.87–5.11)
RDW: 13.4 % (ref 11.5–15.5)
WBC: 9.1 10*3/uL (ref 4.0–10.5)

## 2015-05-05 LAB — TYPE AND SCREEN
ABO/RH(D): B POS
ANTIBODY SCREEN: NEGATIVE

## 2015-05-05 MED ORDER — FLEET ENEMA 7-19 GM/118ML RE ENEM: 1.0000 | ENEMA | RECTAL | Status: DC | PRN

## 2015-05-05 MED ORDER — LACTATED RINGERS IV SOLN: 500.0000 mL | INTRAVENOUS | Status: DC | PRN

## 2015-05-05 MED ORDER — OXYTOCIN BOLUS FROM INFUSION
500.0000 mL | INTRAVENOUS | Status: DC
  Administered 2015-05-05: 500 mL via INTRAVENOUS

## 2015-05-05 MED ORDER — FENTANYL 2.5 MCG/ML BUPIVACAINE 1/10 % EPIDURAL INFUSION (WH - ANES)
14.0000 mL/h | INTRAMUSCULAR | Status: DC | PRN
  Administered 2015-05-05: 14 mL/h via EPIDURAL

## 2015-05-05 MED ORDER — OXYCODONE-ACETAMINOPHEN 5-325 MG PO TABS: 1.0000 | ORAL_TABLET | ORAL | Status: DC | PRN

## 2015-05-05 MED ORDER — ACETAMINOPHEN 325 MG PO TABS: 650.0000 mg | ORAL_TABLET | ORAL | Status: DC | PRN

## 2015-05-05 MED ORDER — OXYTOCIN 40 UNITS IN LACTATED RINGERS INFUSION - SIMPLE MED
62.5000 mL/h | INTRAVENOUS | Status: DC
  Filled 2015-05-05: qty 1000

## 2015-05-05 MED ORDER — OXYCODONE-ACETAMINOPHEN 5-325 MG PO TABS: 2.0000 | ORAL_TABLET | ORAL | Status: DC | PRN

## 2015-05-05 MED ORDER — LACTATED RINGERS IV SOLN
INTRAVENOUS | Status: DC
  Administered 2015-05-05: 22:00:00 via INTRAVENOUS

## 2015-05-05 MED ORDER — ONDANSETRON HCL 4 MG/2ML IJ SOLN: 4.0000 mg | Freq: Four times a day (QID) | INTRAMUSCULAR | Status: DC | PRN

## 2015-05-05 MED ORDER — CITRIC ACID-SODIUM CITRATE 334-500 MG/5ML PO SOLN: 30.0000 mL | ORAL | Status: DC | PRN

## 2015-05-05 MED ORDER — LIDOCAINE HCL (PF) 1 % IJ SOLN
30.0000 mL | INTRAMUSCULAR | Status: DC | PRN
  Filled 2015-05-05: qty 30

## 2015-05-05 MED ORDER — PHENYLEPHRINE 40 MCG/ML (10ML) SYRINGE FOR IV PUSH (FOR BLOOD PRESSURE SUPPORT)
PREFILLED_SYRINGE | INTRAVENOUS | Status: AC
  Filled 2015-05-05: qty 20

## 2015-05-05 MED ORDER — FENTANYL 2.5 MCG/ML BUPIVACAINE 1/10 % EPIDURAL INFUSION (WH - ANES)
INTRAMUSCULAR | Status: AC
  Administered 2015-05-05: 14 mL/h via EPIDURAL
  Filled 2015-05-05: qty 125

## 2015-05-05 MED ORDER — LIDOCAINE HCL (PF) 1 % IJ SOLN
INTRAMUSCULAR | Status: DC | PRN
  Administered 2015-05-05: 4 mL
  Administered 2015-05-05: 6 mL via EPIDURAL

## 2015-05-05 NOTE — Anesthesia Preprocedure Evaluation (Signed)
Anesthesia Evaluation  Patient identified by MRN, date of birth, ID band Patient awake    Reviewed: Allergy & Precautions, H&P , Patient's Chart, lab work & pertinent test results  Airway Mallampati: II  TM Distance: >3 FB Neck ROM: full    Dental  (+) Teeth Intact   Pulmonary asthma (no steroids. no ER) ,  breath sounds clear to auscultation        Cardiovascular Rhythm:regular Rate:Normal     Neuro/Psych    GI/Hepatic   Endo/Other    Renal/GU      Musculoskeletal   Abdominal   Peds  Hematology   Anesthesia Other Findings       Reproductive/Obstetrics (+) Pregnancy                             Anesthesia Physical Anesthesia Plan  ASA: II  Anesthesia Plan: Epidural   Post-op Pain Management:    Induction:   Airway Management Planned:   Additional Equipment:   Intra-op Plan:   Post-operative Plan:   Informed Consent: I have reviewed the patients History and Physical, chart, labs and discussed the procedure including the risks, benefits and alternatives for the proposed anesthesia with the patient or authorized representative who has indicated his/her understanding and acceptance.   Dental Advisory Given  Plan Discussed with:   Anesthesia Plan Comments: (Labs checked- platelets confirmed with RN in room. Fetal heart tracing, per RN, reported to be stable enough for sitting procedure. Discussed epidural, and patient consents to the procedure:  included risk of possible headache,backache, failed block, allergic reaction, and nerve injury. This patient was asked if she had any questions or concerns before the procedure started.)        Anesthesia Quick Evaluation

## 2015-05-05 NOTE — Anesthesia Procedure Notes (Signed)

## 2015-05-05 NOTE — H&P (Signed)
LABOR ADMISSION HISTORY AND PHYSICAL  Julia Morgan is a 24 y.o. female G2P1001 with IUP at 103w2d by 8wk [place] presenting for SOL. She reports +FM, + contractions, No LOF, no VB (bloody show).  She plans on breast feeding. She is undecided on birth control, however patient has female partner.  Dating: By Brigid Re [place] --->  Estimated Date of Delivery: 05/17/15   Clinic K[facility] Prenatal Labs  Dating 8 week [place] Blood type: B/POS/-- (02/29 1019)   Genetic Screen AFP/Quad: normal  Antibody:NEG (02/29 1019)  Anatomic US wnl  Rubella: 8.92 (02/29 1019)  GTT Third trimester: 133 RPR: NON REAC (02/29 1019)   Flu vaccine 11/02/14 HBsAg: NEGATIVE (02/29 1019)   TDaP vaccine  03/02/15  HIV: NONREACTIVE (02/29 1019)   GBS  neg (For PCN allergy, check sensitivities) GBS: neg  Contraception NA-Female partner Pap:neg  Baby Food Breast   Circumcision Yes--in hosptial   Pediatrician Dr. 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 Migraines   . Back pain   . Asthma   . PONV (postoperative nausea and vomiting)   . 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 No past surgeries      Obstetrical History: OB History    Gravida Para Term Preterm AB TAB SAB Ectopic Multiple Living   2 1 1       1       Social History: Social History   Social History  . Marital Status: Single    Spouse Name: N/A  . Number of Children: N/A  . Years of Education: N/A   Occupational History  . trainer    Social History Main Topics  . Smoking status: Never Smoker   . Smokeless tobacco: Never Used  . 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  Drug Use: Yes    Special: Marijuana     Comment: last use Jan 3rd 2016  . Sexual Activity:    Partners: Female    Copy: None   Other Topics Concern  . Not on file   Social History Narrative    Family History: Family History  Problem Relation Age of Onset  . Hyperlipidemia Mother   . Hypertension Mother   . Hyperlipidemia Maternal Grandmother   . Hypertension Maternal Grandmother   . Cancer - Cervical Paternal Grandmother     Allergies: No Known Allergies  Prescriptions prior to admission  Medication Sig Dispense Refill Last Dose  . acetaminophen (TYLENOL) 500 MG tablet Take 1,000 mg by mouth every 6 (six) hours as needed for mild pain, moderate pain or headache.   Taking  . albuterol (PROVENTIL HFA;VENTOLIN HFA) 108 (90 BASE) MCG/ACT inhaler Inhale 1-2 puffs into the lungs every 6 (six) hours as needed for wheezing or shortness of breath. 1 Inhaler 0 Taking  . beclomethasone (QVAR) 40 MCG/ACT inhaler Inhale 1 puff into the lungs 2 (two) times daily. 1 Inhaler 12 Taking  . ondansetron (ZOFRAN) 4 MG tablet Take 1 tablet (4 mg total) by mouth every 8 (eight) hours as needed for nausea or vomiting. 30 tablet 0 Taking  . prenatal vitamin w/FE, FA (PRENATAL 1 + 1) 27-1 MG TABS tablet Take 1 tablet by mouth daily.  30 each 10 Taking  . valACYclovir (VALTREX) 500 MG tablet Take 1 tablet (500 mg total) by mouth daily. Can increase to twice a day for 5 days in the event of a recurrence 30 tablet 12 Taking     Review of Systems  All systems reviewed and negative except as stated in HPI  BP 115/76 mmHg  Pulse 93  Temp(Src) 97.6 F (36.4 C) (Axillary)  Resp 20  Ht  (1.651 m)  Wt 151 lb (68.493 kg)  BMI 25.13 kg/m2  LMP 07/28/2014 (Exact Date) General appearance: alert, cooperative and mild distress Lungs: normal work of breathing Heart: regular rate  Abdomen: soft, non-tender; bowel sounds normal Pelvic: adequate Extremities: Homans sign is negative,  no sign of DVT, edema Presentation: cephalic Fetal monitoring: Baseline: 120 bpm, Variability: Good {> 6 bpm), Accelerations: Reactive and Decelerations: Absent Uterine activity: Frequency: Every 2-4 minutes Dilation: 8 Effacement (%): 100 Station: -2 Exam by:: ginger morris rn   Prenatal labs: ABO, Rh: --/--/B POS (08/30 2120) Antibody: PENDING (08/30 2120) Rubella:  Immune RPR: NON REAC (06/27 1540)  HBsAg: NEGATIVE (02/29 1019)  HIV: NONREACTIVE (06/27 1540)  GBS: Negative (08/16 0000)  1 hr Glucola 133 Genetic screening normal Anatomy US normal  Prenatal Transfer Tool  Maternal Diabetes: No Genetic Screening: Normal Maternal Ultrasounds/Referrals: Normal Fetal Ultrasounds or other Referrals:  None Maternal Substance Abuse:  No Significant Maternal Medications:  None Significant Maternal Lab Results: Lab values include: Group B Strep negative  Results for orders placed or performed during the hospital encounter of 05/05/15 (from the past 24 hour(s))  CBC   Collection Time: 05/05/15  9:20 PM  Result Value Ref Range   WBC PENDING 4.0 - 10.5 K/uL   RBC 4.61 3.87 - 5.11 MIL/uL   Hemoglobin 12.7 12.0 - 15.0 g/dL   HCT 16.1 (L) 09.6 - 04.5 %   MCV 76.6 (L) 78.0 - 100.0 fL   MCH 27.5 26.0 - 34.0 pg   MCHC 36.0 30.0 - 36.0 g/dL   RDW 40.9 81.1 - 91.4 %   Platelets 146 (L) 150 - 400 K/uL  Type and screen   Collection Time: 05/05/15  9:20 PM  Result Value Ref Range   ABO/RH(D) B POS    Antibody Screen PENDING    Sample Expiration 05/08/2015     Patient Active Problem List   Diagnosis Date Noted  . Normal labor and delivery 05/05/2015  . Borderline Oligohydramnios 04/25/2015  . Vaginal discharge during pregnancy in third trimester 03/31/2015  . Herpes, genital 01/15/2015  . Marijuana use 11/08/2014  . Supervision of normal pregnancy in second trimester 11/03/2014    Assessment: Julia Morgan is a 24 y.o. G2P1001 at [redacted]w[redacted]d here for SOL.   Admit to [employer] #Labor: Expectant management of NSVD #Pain: Plan for epidural #FWB: Category 1 #ID: GBS negative #MOF: Breast #MOC: NA- female partner #Circ: Yes, inpatient  Caryl Ada, DO 05/05/2015, 10:02 PM PGY-2, [facility]  OB fellow attestation: I have seen and examined this patient; I agree with above documentation in the resident's note.   Julia Morgan is a 24 y.o. G2P1001 here for SOL  PE: BP 135/70 mmHg  Pulse 62  Temp(Src) 97.6 F (36.4 C) (Axillary)  Resp 20  Ht  (1.651 m)  Wt 151 lb (68.493 kg)  BMI 25.13 kg/m2  SpO2 100%  LMP 07/28/2014 (Exact Date) Gen: calm comfortable, NAD Resp: normal effort, no distress Abd: gravid  ROS, labs, PMH  reviewed  Plan: Admit to L&D for delivery #Labor: Expectant management of NSVD #Pain: Plan for epidural #FWB: Category 1 #ID: GBS negative #MOF: Breast #MOC: NA- female partner #Circ: Yes, inpatient  Federico Flake, MD Family Medicine, OB Fellow 05/06/2015, 12:54 AM

## 2015-05-06 ENCOUNTER — Ambulatory Visit (HOSPITAL_COMMUNITY): Payer: BLUE CROSS/BLUE SHIELD

## 2015-05-06 ENCOUNTER — Encounter (HOSPITAL_COMMUNITY): Payer: Self-pay | Admitting: *Deleted

## 2015-05-06 LAB — RPR: RPR Ser Ql: NONREACTIVE

## 2015-05-06 LAB — CBC
HCT: 30.8 % — ABNORMAL LOW (ref 36.0–46.0)
Hemoglobin: 11.2 g/dL — ABNORMAL LOW (ref 12.0–15.0)
MCH: 27.9 pg (ref 26.0–34.0)
MCHC: 36.4 g/dL — AB (ref 30.0–36.0)
MCV: 76.8 fL — ABNORMAL LOW (ref 78.0–100.0)
PLATELETS: 163 10*3/uL (ref 150–400)
RBC: 4.01 MIL/uL (ref 3.87–5.11)
RDW: 13.3 % (ref 11.5–15.5)
WBC: 11.2 10*3/uL — ABNORMAL HIGH (ref 4.0–10.5)

## 2015-05-06 MED ORDER — PRENATAL MULTIVITAMIN CH
1.0000 | ORAL_TABLET | Freq: Every day | ORAL | Status: DC
  Administered 2015-05-06: 1 via ORAL
  Filled 2015-05-06: qty 1

## 2015-05-06 MED ORDER — DIBUCAINE 1 % RE OINT: 1.0000 "application " | TOPICAL_OINTMENT | RECTAL | Status: DC | PRN

## 2015-05-06 MED ORDER — SIMETHICONE 80 MG PO CHEW: 80.0000 mg | CHEWABLE_TABLET | ORAL | Status: DC | PRN

## 2015-05-06 MED ORDER — PHENYLEPHRINE 40 MCG/ML (10ML) SYRINGE FOR IV PUSH (FOR BLOOD PRESSURE SUPPORT): 80.0000 ug | PREFILLED_SYRINGE | INTRAVENOUS | Status: DC | PRN

## 2015-05-06 MED ORDER — TETANUS-DIPHTH-ACELL PERTUSSIS 5-2.5-18.5 LF-MCG/0.5 IM SUSP: 0.5000 mL | Freq: Once | INTRAMUSCULAR | Status: DC

## 2015-05-06 MED ORDER — INFLUENZA VAC SPLIT QUAD 0.5 ML IM SUSY
0.5000 mL | PREFILLED_SYRINGE | INTRAMUSCULAR | Status: DC
Start: 2015-05-06 — End: 2015-05-07

## 2015-05-06 MED ORDER — OXYCODONE-ACETAMINOPHEN 5-325 MG PO TABS: 2.0000 | ORAL_TABLET | ORAL | Status: DC | PRN

## 2015-05-06 MED ORDER — ONDANSETRON HCL 4 MG PO TABS: 4.0000 mg | ORAL_TABLET | ORAL | Status: DC | PRN

## 2015-05-06 MED ORDER — BENZOCAINE-MENTHOL 20-0.5 % EX AERO
1.0000 "application " | INHALATION_SPRAY | CUTANEOUS | Status: DC | PRN
  Administered 2015-05-06: 1 via TOPICAL
  Filled 2015-05-06 (×2): qty 56

## 2015-05-06 MED ORDER — FENTANYL 2.5 MCG/ML BUPIVACAINE 1/10 % EPIDURAL INFUSION (WH - ANES)
14.0000 mL/h | INTRAMUSCULAR | Status: DC | PRN
Start: 2015-05-06 — End: 2015-05-06

## 2015-05-06 MED ORDER — IBUPROFEN 600 MG PO TABS
600.0000 mg | ORAL_TABLET | Freq: Four times a day (QID) | ORAL | Status: DC
Start: 2015-05-06 — End: 2015-05-07
  Administered 2015-05-06 – 2015-05-07 (×6): 600 mg via ORAL
  Filled 2015-05-06 (×6): qty 1

## 2015-05-06 MED ORDER — SENNOSIDES-DOCUSATE SODIUM 8.6-50 MG PO TABS
2.0000 | ORAL_TABLET | ORAL | Status: DC
  Administered 2015-05-07: 2 via ORAL
  Filled 2015-05-06: qty 2

## 2015-05-06 MED ORDER — PNEUMOCOCCAL VAC POLYVALENT 25 MCG/0.5ML IJ INJ
0.5000 mL | INJECTION | INTRAMUSCULAR | Status: DC
  Filled 2015-05-06: qty 0.5

## 2015-05-06 MED ORDER — EPHEDRINE 5 MG/ML INJ: 10.0000 mg | INTRAVENOUS | Status: DC | PRN

## 2015-05-06 MED ORDER — DIPHENHYDRAMINE HCL 25 MG PO CAPS: 25.0000 mg | ORAL_CAPSULE | Freq: Four times a day (QID) | ORAL | Status: DC | PRN

## 2015-05-06 MED ORDER — ONDANSETRON HCL 4 MG/2ML IJ SOLN: 4.0000 mg | INTRAMUSCULAR | Status: DC | PRN

## 2015-05-06 MED ORDER — ACETAMINOPHEN 325 MG PO TABS: 650.0000 mg | ORAL_TABLET | ORAL | Status: DC | PRN

## 2015-05-06 MED ORDER — ZOLPIDEM TARTRATE 5 MG PO TABS: 5.0000 mg | ORAL_TABLET | Freq: Every evening | ORAL | Status: DC | PRN

## 2015-05-06 MED ORDER — LANOLIN HYDROUS EX OINT
TOPICAL_OINTMENT | CUTANEOUS | Status: DC | PRN
Start: 2015-05-06 — End: 2015-05-07

## 2015-05-06 MED ORDER — DIPHENHYDRAMINE HCL 50 MG/ML IJ SOLN: 12.5000 mg | INTRAMUSCULAR | Status: DC | PRN

## 2015-05-06 MED ORDER — WITCH HAZEL-GLYCERIN EX PADS: 1.0000 "application " | MEDICATED_PAD | CUTANEOUS | Status: DC | PRN

## 2015-05-06 MED ORDER — OXYCODONE-ACETAMINOPHEN 5-325 MG PO TABS: 1.0000 | ORAL_TABLET | ORAL | Status: DC | PRN

## 2015-05-06 NOTE — Lactation Note (Signed)
This note was copied from the chart of Julia Morgan. Lactation Consultation Note Lactation to see mom after she is aware of UDS + screening, or if she needs assist prior.   Patient Name: Julia Ellia Knowlton WGNFA'O Date: 05/06/2015     Maternal Data    Feeding Feeding Type: Breast Fed Length of feed: 45 min  LATCH Score/Interventions Latch: Grasps breast easily, tongue down, lips flanged, rhythmical sucking.  Audible Swallowing: A few with stimulation  Type of Nipple: Everted at rest and after stimulation  Comfort (Breast/Nipple): Soft / non-tender     Hold (Positioning): No assistance needed to correctly position infant at breast.  LATCH Score: 9  Lactation Tools Discussed/Used     Consult Status      Suvan Stcyr, Arvella Merles 05/06/2015, 9:45 PM

## 2015-05-06 NOTE — Anesthesia Postprocedure Evaluation (Signed)
Anesthesia Post Note  Patient: Julia Morgan  Procedure(s) Performed: * No procedures listed *  Anesthesia type: Epidural  Patient location: Mother/Baby  Post pain: Pain level controlled  Post assessment: Post-op Vital signs reviewed  Last Vitals:  Filed Vitals:   05/06/15 0627  BP: 119/71  Pulse: 64  Temp: 37.1 C  Resp: 18    Post vital signs: Reviewed  Level of consciousness: awake  Complications: No apparent anesthesia complications

## 2015-05-06 NOTE — Progress Notes (Signed)
Post Partum Day 1 Subjective:  Julia Morgan is a 24 y.o. N5A2130 [redacted]w[redacted]d s/p NSVD.  No acute events overnight.  Pt denies problems with ambulating, voiding or po intake.  She denies nausea or vomiting.  Pain is well controlled.  She has had flatus. She has not had bowel movement.  Lochia Small.  Plan for birth control is female partner.  Method of Feeding: bottle  Objective: Blood pressure 119/71, pulse 64, temperature 98.7 F (37.1 C), temperature source Oral, resp. rate 18, height  (1.651 m), weight 151 lb (68.493 kg), last menstrual period 07/28/2014, SpO2 100 %, unknown if currently breastfeeding.  Physical Exam:  General: alert, cooperative and no distress Lochia:normal flow Chest: Normal WOB Heart: RRR no m/r/g Abdomen: +BS, soft, nontender,  Uterine Fundus: firm DVT Evaluation: No evidence of DVT seen on physical exam. Extremities: no edema   Recent Labs  05/05/15 2120 05/06/15 0545  HGB 12.7 11.2*  HCT 35.3* 30.8*   Assessment/Plan:  ASSESSMENT: Julia Morgan is a 24 y.o. [id number] [redacted]w[redacted]d s/p NSVD Routine PP care Plan for discharge tomorrow and Circumcision prior to discharge   LOS: 1 day   Federico Flake 05/06/2015, 9:04 AM

## 2015-05-07 MED ORDER — IBUPROFEN 600 MG PO TABS: 600.0000 mg | ORAL_TABLET | Freq: Four times a day (QID) | ORAL | Status: DC

## 2015-05-07 NOTE — Lactation Note (Signed)
This note was copied from the chart of Julia Deion Shuck. Lactation Consultation Note; Mom reports that baby has been feeding well- just finished nursing for 15 min- baby asleep in mom's arms,.Reports nipples are just a little tender- has had a few times when he did get not on well and she had to take him off. Has Medela DEBP at home- going back to work in 1 mon. Reviewed engorgement prevention and treatment. No further questions at present. Reviewed BFGS and OP appointments as resources for support after DC. To call prn  Patient Name: Julia Morgan [id number] Date: 05/07/2015 Reason for consult: Follow-up assessment   Maternal Data Formula Feeding for Exclusion: No Does the patient have breastfeeding experience prior to this delivery?: Yes  Feeding    LATCH Score/Interventions                      Lactation Tools Discussed/Used WIC Program: No   Consult Status Consult Status: Complete    Pamelia Hoit 05/07/2015, 9:10 AM

## 2015-05-07 NOTE — Clinical Social Work Maternal (Signed)
CLINICAL SOCIAL WORK MATERNAL/CHILD NOTE  Patient Details  Name: Julia Morgan MRN: [id number] Date of Birth: 02/28/1991  Date:  05/07/2015  Clinical Social Worker Initiating Note:  Kimberlie Csaszar, LCSW Date/ Time Initiated:  05/07/15/1000     Child's Name:  Julia Morgan   Legal Guardian:  Julia Morgan (mother) and Julia Morgan   Need for Interpreter:  None   Date of Referral:  05/05/15     Reason for Referral:  Current Substance Use/Substance Use During Pregnancy    Referral Source:  Central Nursery   Address:  [address] [place], [place] [address]  Phone number:  [phone]   Household Members:  Spouse, Minor Children   Natural Supports (not living in the home):  Extended Family, Immediate Family   Professional Supports: None   Employment: Full-time   Type of Work: Trainer at [employer]   Education:    N/A  Financial Resources:  Private Insurance   Other Resources:    N/A  Cultural/Religious Considerations Which May Impact Care:  None reported  Strengths:  Ability to meet basic needs , Home prepared for child , Pediatrician chosen , Understanding of illness   Risk Factors/Current Problems:   1)Mental Health Concerns: MOB reported history of bipolar since age 15. She denied any need for medication in "years". MOB reported concerns about her mood and symptoms during the pregnancy, and discussed goal of addressing her concerns postpartum with an outpatient provider. 2)Substance Use: MOB presents with THC during the pregnancy to assist with stress and nausea, last use 1 month ago. Infant's UDS is negative and MDS is pending.   Cognitive State:  Able to Concentrate , Alert , Goal Oriented , Linear Thinking , Insightful    Mood/Affect:  Animated, Happy , Interested    CSW Assessment:  CSW received request for consult due to MOB presenting with a history of THC use during the pregnancy.  MOB provided consent for her wife to remain the room during the assessment.  The  MOB's wife participated when prompted, but primarily was quiet, but attentive.  MOB displayed a full range in affect, and was noted to be a pleasant mood. MOB was easily engaged, receptive to the visit, and openly discussed her mental health and substance use history.   MOB expressed appreciation for the CSW visit since she had been wanting to speak to a mental health clinician prior to discharge. She shared that she has a history of bipolar since age 15, and has noted during the pregnancy that she has been feeling anxious, overwhelmed, and experienced significant mood swings. MOB shared that she spoke to her OB about her symptoms, and they recommended consulting with a mental health clinician about how she is feeling. She discussed concern about postpartum depression, and reported desire to address symptoms if they arise.  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 She acknowledged that she was able to continue to go to work and engage in activities of daily living, and is unsure if what she was experiencing was beyond what is "normal" during a pregnancy.  CSW validated the difficulties, and confirmed that many symptoms were normal for a pregnancy, but may have began to transcend normal limits.    CSW and MOB continued to begin to explore with MOB how to support MOB's mental health as she transitions postpartum. MOB receptive to referrals for outpatient mental health treatment postpartum.  CSW also explored   and introduced various cognitive techniques, and MOB shared that she continues to work with her wife about realistic expectations for herself as a woman/mother and how to de-catastrophize her worries and fears. MOB also expressed fear of asking for help since she wants to ensure that she has a close relationship with her son. She acknowledged that if she were to occasionally ask for help that  it would not negatively impact her relationship, and that it would likely assist her mental health since "I know that I can't always do it all".  MOB will continue to need support as she disengages from worries and fears; however, she presents with awareness and goals of reducing high expectations for herself.  CSW and MOB continued to explore non-clinical interventions that support maternal mental health including sleep, healthy diet, and exercise.  MOB presented as receptive to incorporating these activities and other activities that reduce stress and increase feelings of happiness.  MOB reported THC use until she learned of the pregnancy. She discussed belief that it was a maladaptive coping skill since she would begin to use THC when she became stressed. MOB shared that during the pregnancy, she would use THC less frequently, but continued due to nausea and stress. MOB reported that approximately one month she started to trying alternative coping skills for stress, and denied any THC use in past month. MOB discussed belief that she is feeling all emotions more fully, and reported that she feels better while clean/sober.  MOB verbalized understanding of the hospital drug screen policy, and denied questions or concerns related to the collection of the infant's urine and meconium. MOB expressed belief that the infant's MDS will likely be positive, and denied concerns about CPS involvement.    Overall, MOB expressed appreciation for the support, and agreed to contact CSW if additional needs arise during the admission.  CSW Plan/Description:   1)Patient/Family Education: Perinatal mood and anxiety disorders, hospital drug screen policy 2)CSW to monitor infant's drug screens, and will make a CPS report if positive. 3)Information/Referral to Community Resources: Outpatient mental health resources   4)No Further Intervention Required/No Barriers to Discharge    Janisha Bueso N, LCSW 05/07/2015, 11:18 AM  

## 2015-05-07 NOTE — Discharge Summary (Signed)
Obstetric Discharge Summary  Reason for Admission: onset of labor Prenatal Procedures: none Intrapartum Procedures: spontaneous vaginal delivery Postpartum Procedures: none Complications-Operative and Postpartum: none  Upon arrival patient was complete and pushing. She pushed with good maternal effort to deliver a healthy baby. Baby delivered without difficulty. Was noted to have a double loose nuchal and body nuchal. Had good tone and place on maternal abdomen for oral suctioning, drying and stimulation. Delayed cord clamping performed. Placenta delivered intact with 3V cord. Vaginal canal and perineum was inspected and intact; hemostatic. Pitocin was started and uterus massaged until bleeding slowed. Counts of sharps, instruments, and lap pads were all correct.   Delivery Note At 11:18 PM a viable female was delivered via Vaginal, Spontaneous Delivery (Presentation: Middle Occiput Posterior). APGAR: 9, 9; weight pending. Placenta status: Intact, Spontaneous. Cord: 3 vessels with the following complications: None. Cord pH: Not collected.   Anesthesia: Epidural  Episiotomy: None Lacerations: None Suture Repair: N/A Est. Blood Loss (mL): 100    Hospital Course:   Julia Morgan is a 24 y.o. [id number] s/p nsvd.  Patient was admitted in active labor and delivered as above. GBS negative.  She has postpartum course that was uncomplicated including no problems with ambulating, PO intake, urination, pain, or bleeding. The pt feels ready to go home and  will be discharged with outpatient follow-up.   Today: No acute events overnight.  Pt denies problems with ambulating, voiding or po intake.  She denies nausea or vomiting.  Pain is well controlled.  She has had flatus. She has not had bowel movement.  Lochia Small.  Plan for birth control is  undecided (in a female relationship).  Method of Feeding: breast  Physical Exam:  General: alert, cooperative and appears stated age 3:  appropriate Uterine Fundus: firm Incision: n/a DVT Evaluation: No evidence of DVT seen on physical exam.  H/H: Lab Results  Component Value Date/Time   HGB 11.2* 05/06/2015 05:45 AM   HCT 30.8* 05/06/2015 05:45 AM    Discharge Diagnoses: Term Pregnancy-delivered  Discharge Information: Date: 05/07/2015 Activity: pelvic rest Diet: routine  Medications: PNV and Ibuprofen Breast feeding:  Yes Condition: stable Instructions: refer to handout Discharge to: home   Discharge Instructions    Call MD for:  difficulty breathing, headache or visual disturbances    Complete by:  As directed      Call MD for:  extreme fatigue    Complete by:  As directed      Call MD for:  hives    Complete by:  As directed      Call MD for:  persistant dizziness or light-headedness    Complete by:  As directed      Call MD for:  persistant nausea and vomiting    Complete by:  As directed      Call MD for:  severe uncontrolled pain    Complete by:  As directed      Call MD for:  temperature >100.4    Complete by:  As directed      Call MD for:    Complete by:  As directed      Diet general    Complete by:  As directed      Sexual acrtivity    Complete by:  As directed   Nothing in the vagina for at least 2 weeks            Medication List    STOP taking these medications  ondansetron 4 MG tablet  Commonly known as:  ZOFRAN     valACYclovir 500 MG tablet  Commonly known as:  VALTREX      TAKE these medications        acetaminophen 500 MG tablet  Commonly known as:  TYLENOL  Take 1,000 mg by mouth every 6 (six) hours as needed for mild pain, moderate pain or headache.     albuterol 108 (90 BASE) MCG/ACT inhaler  Commonly known as:  PROVENTIL HFA;VENTOLIN HFA  Inhale 1-2 puffs into the lungs every 6 (six) hours as needed for wheezing or shortness of breath.     beclomethasone 40 MCG/ACT inhaler  Commonly known as:  QVAR  Inhale 1 puff into the lungs 2 (two) times daily.      ibuprofen 600 MG tablet  Commonly known as:  ADVIL,MOTRIN  Take 1 tablet (600 mg total) by mouth every 6 (six) hours.     prenatal vitamin w/FE, FA 27-1 MG Tabs tablet  Take 1 tablet by mouth daily.           Follow-up Information    Follow up with [facility] [place] In 6 weeks.   Specialty:  Obstetrics and Gynecology   Contact information:   [address] [place] [address] [phone]      Silvano Bilis ,MD OB Fellow 05/07/2015,8:15 AM

## 2015-05-12 ENCOUNTER — Ambulatory Visit (HOSPITAL_COMMUNITY): Payer: BLUE CROSS/BLUE SHIELD

## 2015-05-12 ENCOUNTER — Encounter: Payer: BLUE CROSS/BLUE SHIELD | Admitting: Obstetrics & Gynecology

## 2015-06-09 ENCOUNTER — Ambulatory Visit (INDEPENDENT_AMBULATORY_CARE_PROVIDER_SITE_OTHER): Payer: BLUE CROSS/BLUE SHIELD | Admitting: Obstetrics & Gynecology

## 2015-06-09 ENCOUNTER — Encounter: Payer: Self-pay | Admitting: *Deleted

## 2015-06-09 ENCOUNTER — Encounter: Payer: Self-pay | Admitting: Obstetrics & Gynecology

## 2015-06-09 NOTE — Progress Notes (Signed)
   Post Partum Visit  Julia Morgan is a 24 y.o. G35P2002 female who presents for a postpartum visit. She is 6 weeks postpartum following a spontaneous vaginal delivery. I have fully reviewed the prenatal and intrapartum course. The delivery was at 38w 6d gestational weeks.  Anesthesia: epidural. Postpartum course has been unremarkable.. Baby's course has been unremarkable. Baby is feeding by bottle - Enfamil AR. Bleeding no bleeding. Bowel function is normal. Bladder function is normal. Patient is not sexually active. Contraception method is none. Postpartum depression neg.  The following portions of the patient's history were reviewed and updated as appropriate: allergies, current medications, past family history, past medical history, past social history, past surgical history and problem list.  Normal pap smear on 11/03/14.  Review of Systems Pertinent items noted in HPI and remainder of comprehensive ROS otherwise negative.   Objective:    BP 116/78 mmHg  Pulse 78  Resp 16  Ht  (1.651 m)  Wt 211 lb (95.709 kg)  BMI 35.11 kg/m2  Breastfeeding? Yes  General:  alert and no distress   Breasts:  inspection negative, no nipple discharge or bleeding, no masses or nodularity palpable  Lungs: clear to auscultation bilaterally  Heart:  regular rate and rhythm  Abdomen: soft, non-tender; bowel sounds normal; no masses,  no organomegaly  Pelvic:  not evaluated        Assessment:   Normal postpartum exam. Pap smear not done at today's visit.   Plan:   1. Contraception: none, in a same-sex relationship 2. Up to date on pap smears 3. Follow up as needed.   Jaynie Collins, MD, FACOG Attending Obstetrician & Gynecologist, [facility] and [facility]

## 2015-08-26 ENCOUNTER — Other Ambulatory Visit (INDEPENDENT_AMBULATORY_CARE_PROVIDER_SITE_OTHER): Payer: BLUE CROSS/BLUE SHIELD

## 2015-08-26 DIAGNOSIS — R309 Painful micturition, unspecified: Secondary | ICD-10-CM

## 2015-08-26 LAB — POCT URINALYSIS DIPSTICK
Bilirubin, UA: NEGATIVE
Glucose, UA: NEGATIVE
KETONES UA: NEGATIVE
NITRITE UA: POSITIVE
PH UA: 6.5
PROTEIN UA: NEGATIVE
Spec Grav, UA: >=1.03
Urobilinogen, UA: NEGATIVE

## 2015-08-26 MED ORDER — PHENAZOPYRIDINE HCL 200 MG PO TABS: ORAL_TABLET | ORAL | Status: DC

## 2015-08-26 MED ORDER — SULFAMETHOXAZOLE-TRIMETHOPRIM 800-160 MG PO TABS: 1.0000 | ORAL_TABLET | Freq: Two times a day (BID) | ORAL | Status: DC

## 2015-08-26 NOTE — Progress Notes (Signed)
Pt here for nurse visit with c/o"s of urinary pain for 2 days.  She states that she saw blood today when she wipes.  LMP was 2 weeks ago.  Per protocol Bactrim DS and Pyridium sent to her pharmacy.  Urine culture sent to lab

## 2015-08-28 ENCOUNTER — Telehealth: Payer: Self-pay | Admitting: *Deleted

## 2015-08-28 DIAGNOSIS — N390 Urinary tract infection, site not specified: Secondary | ICD-10-CM

## 2015-08-28 LAB — CULTURE, URINE COMPREHENSIVE: Colony Count: 95000

## 2015-08-28 MED ORDER — SULFAMETHOXAZOLE-TRIMETHOPRIM 800-160 MG PO TABS
1.0000 | ORAL_TABLET | Freq: Two times a day (BID) | ORAL | Status: DC
Start: 2015-08-28 — End: 2017-06-07

## 2015-08-28 NOTE — Telephone Encounter (Signed)
Called pt to adv UTI on urine culture and [facility] for [facility]

## 2015-09-23 ENCOUNTER — Ambulatory Visit: Payer: BLUE CROSS/BLUE SHIELD | Admitting: Obstetrics & Gynecology

## 2016-02-12 IMAGING — US US OB COMP +14 WK
1 series · 12 of 28 positions shown · non-contrast
Comparison: none

[Series 1: us ob +14 all · 120 acquisitions, 12 frames shown]
[im 5/120]
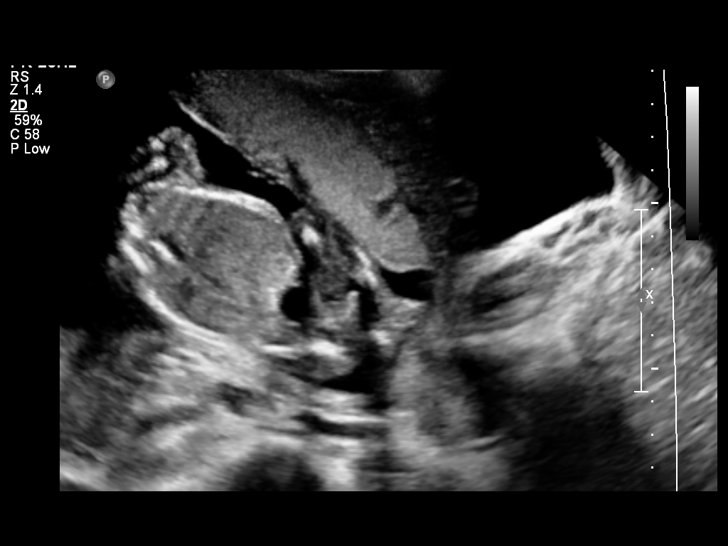
[im 14/120]
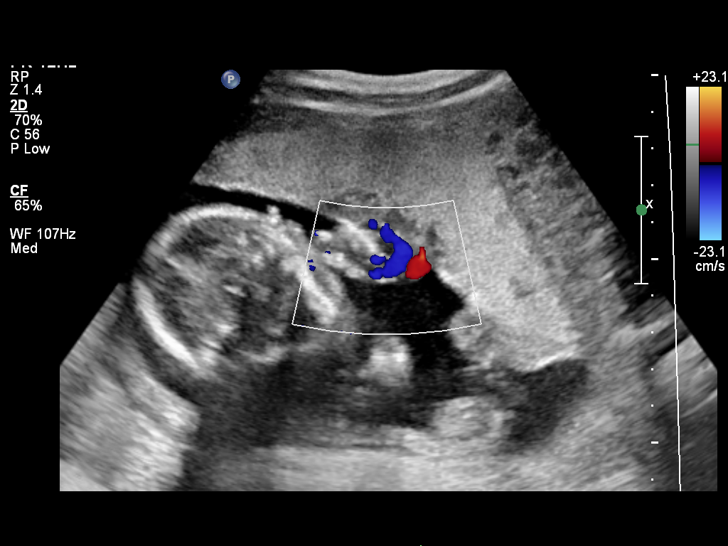
[im 23/120]
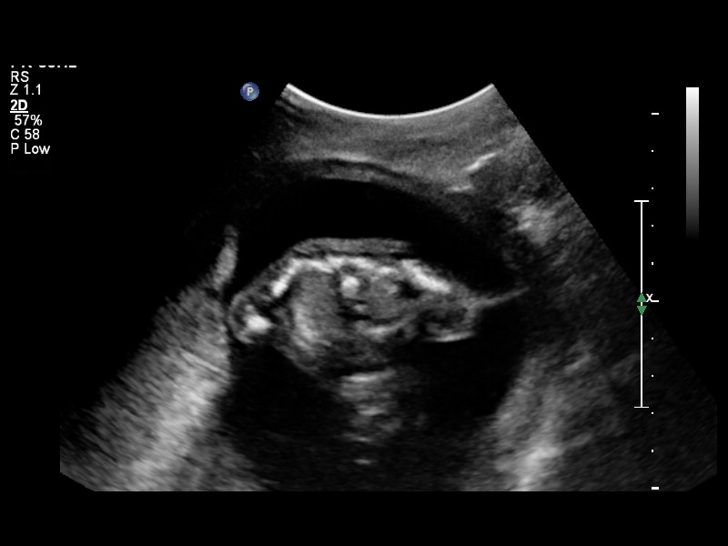
[im 36/120]
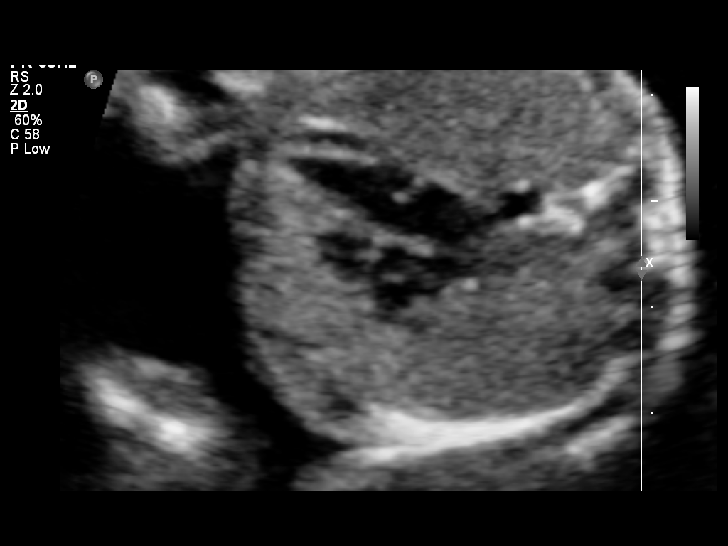
[im 45/120]
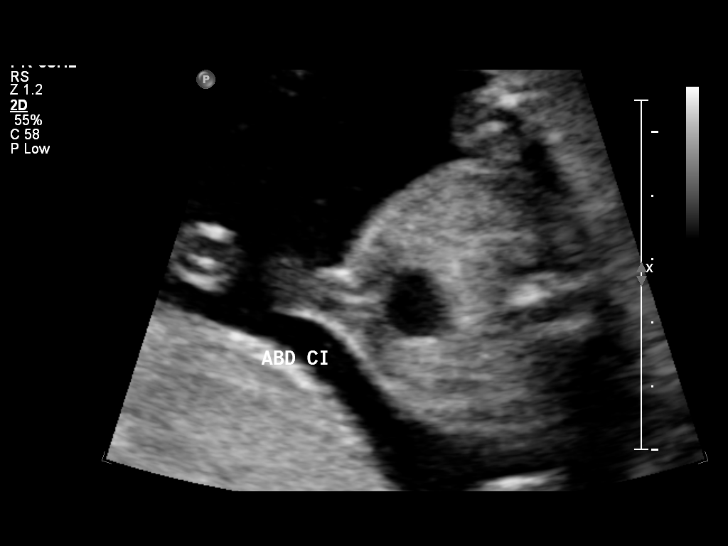
[im 53/120]
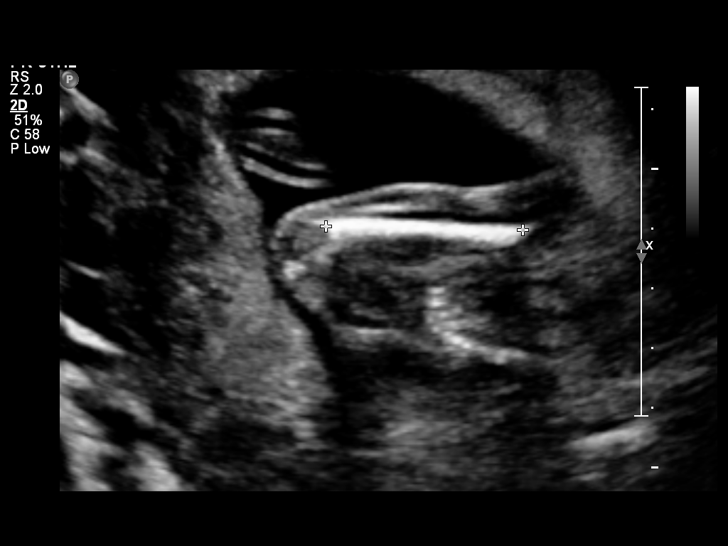
[im 67/120]
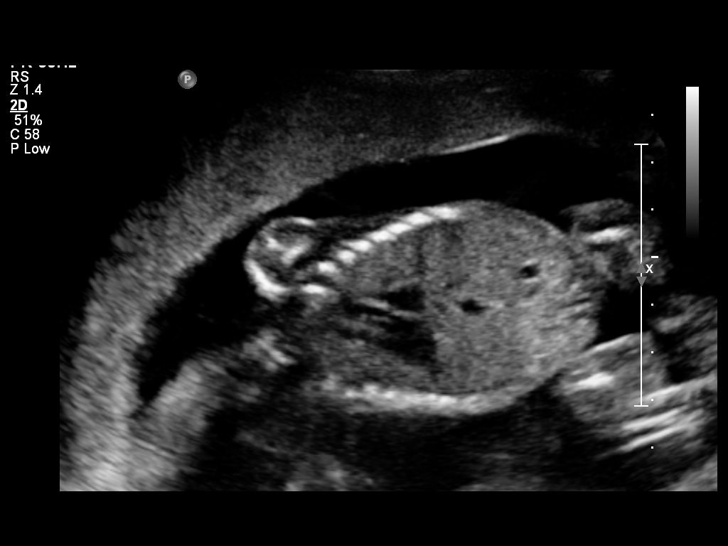
[im 75/120]
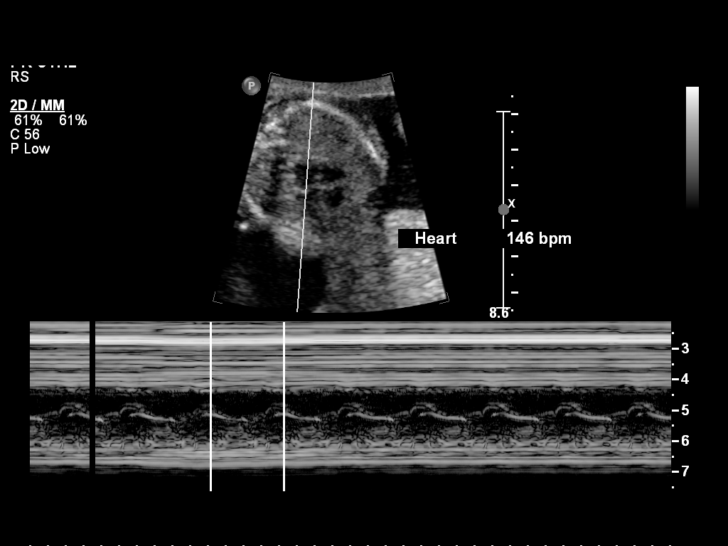
[im 84/120]
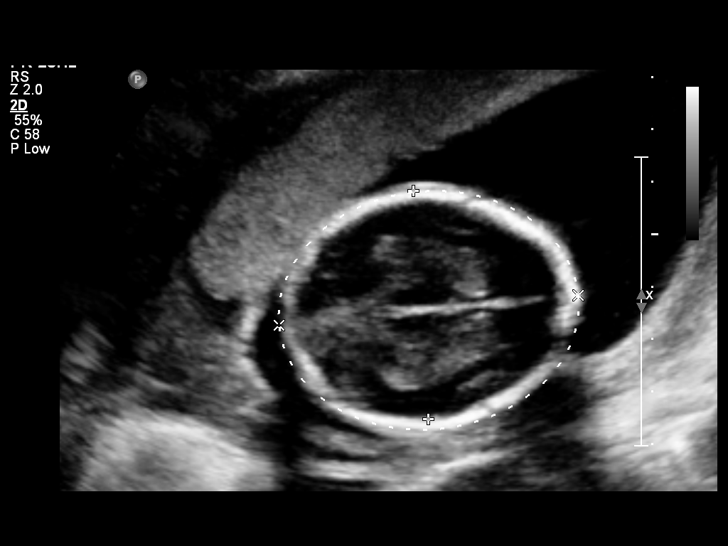
[im 97/120]
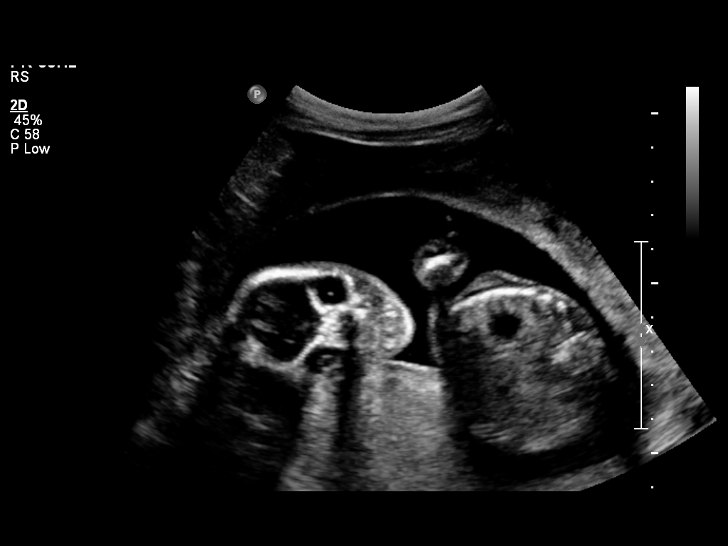
[im 106/120]
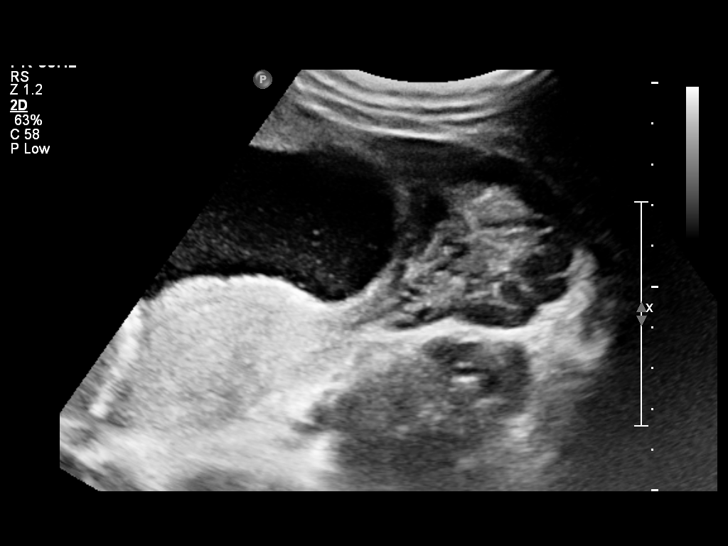
[im 115/120]
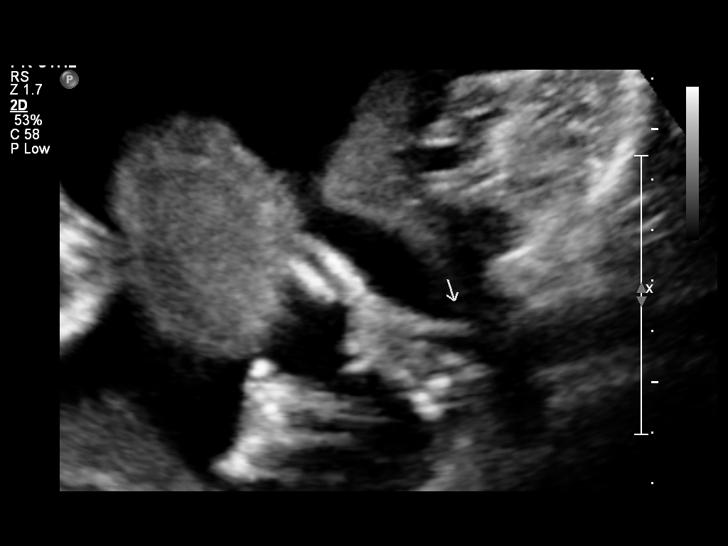

[12 of 28 positions shown; findings below may reference images not displayed]

OBSTETRICS REPORT
(Signed Final 12/22/2014 [DATE])

Service(s) Provided

US OB COMP + 14 WK                                    76805.1
Indications

Basic anatomic survey                                 z36
19 weeks gestation of pregnancy
Fetal Evaluation

Num Of Fetuses:    1
Fetal Heart Rate:  146                          bpm
Cardiac Activity:  Observed
Presentation:      Breech
Placenta:          Anterior, above cervical os
P. Cord            Visualized, central
Insertion:

Amniotic Fluid
AFI FV:      Subjectively within normal limits
Larg Pckt:    6.03  cm
Biometry

BPD:     43.6  mm     G. Age:  19w 1d                CI:         78.7   70 - 86
OFD:     55.4  mm                                    FL/HC:      20.2   16.1 -
18.3
HC:     161.2  mm     G. Age:  18w 6d       33  %    HC/AC:      1.08   1.09 -
1.39
AC:     149.7  mm     G. Age:  20w 1d       79  %    FL/BPD:
FL:      32.5  mm     G. Age:  20w 1d       77  %    FL/AC:      21.7   20 - 24
HUM:     30.5  mm     G. Age:  20w 1d       75  %
CER:     19.5  mm     G. Age:  18w 5d       40  %
NFT:     2.35  mm

Est. FW:     327  gm    0 lb 12 oz      58  %
Gestational Age

LMP:           21w 0d        Date:  07/28/14                 EDD:   05/04/15
U/S Today:     19w 4d                                        EDD:   05/14/15
Best:          19w 1d     Det. By:  Early Ultrasound         EDD:   05/17/15
(10/06/14)
Anatomy
Cranium:          Appears normal         Aortic Arch:      Appears normal
Fetal Cavum:      Appears normal         Ductal Arch:      Appears normal
Ventricles:       Appears normal         Diaphragm:        Appears normal
Choroid Plexus:   Appears normal         Stomach:          Appears normal, left
sided
Cerebellum:       Appears normal         Abdomen:          Appears normal
Posterior Fossa:  Appears normal         Abdominal Wall:   Appears nml (cord
insert, abd wall)
Nuchal Fold:      Appears normal         Cord Vessels:     Appears normal (3
vessel cord)
Face:             Appears normal         Kidneys:          Appear normal
(orbits and profile)
Lips:             Appears normal         Bladder:          Appears normal
Heart:            Appears normal         Spine:            Appears normal
(4CH, axis, and
situs)
RVOT:             Not well visualized    Lower             Appears normal
Extremities:
LVOT:             Appears normal         Upper             Appears normal
Extremities:

Other:  Fetus appears to be a male. Right 5th digit visualized. Technically
difficult due to fetal position.
Targeted Anatomy

Fetal Central Nervous System
Lat. Ventricles:  6.1                    Cisterna Magna:
Cervix Uterus Adnexa

Cervical Length:    3.58     cm

Cervix:       Normal appearance by transabdominal scan.
Uterus:       No abnormality visualized.
Cul De Sac:   No free fluid seen.

Left Ovary:    No adnexal mass visualized.
Right Ovary:   No adnexal mass visualized.
Adnexa:     No abnormality visualized.
Impression

Single IUP at 19w 1d
Somewhat limited views of the fetal heart obtained (RVOT)
The remainder of the fetal anatomy appears normal
No markers associated with aneuploidy noted
Anterior placenta without previa
Normal amniotic fluid volume
Recommendations

Recommend follow-up ultrasound examination in 4 weeks to
complete anatomy

## 2016-02-23 ENCOUNTER — Encounter (HOSPITAL_BASED_OUTPATIENT_CLINIC_OR_DEPARTMENT_OTHER): Payer: Self-pay | Admitting: *Deleted

## 2016-02-23 ENCOUNTER — Emergency Department (HOSPITAL_BASED_OUTPATIENT_CLINIC_OR_DEPARTMENT_OTHER)
Admission: EM | Admit: 2016-02-23 | Discharge: 2016-02-23 | Disposition: A | Discharge status: Home / Self Care | Payer: BLUE CROSS/BLUE SHIELD | Attending: Emergency Medicine | Admitting: Emergency Medicine

## 2016-02-23 DIAGNOSIS — G43009 Migraine without aura, not intractable, without status migrainosus: Secondary | ICD-10-CM | POA: Diagnosis present

## 2016-02-23 DIAGNOSIS — J45909 Unspecified asthma, uncomplicated: Secondary | ICD-10-CM | POA: Diagnosis not present

## 2016-02-23 LAB — PREGNANCY, URINE: PREG TEST UR: NEGATIVE

## 2016-02-23 MED ORDER — KETOROLAC TROMETHAMINE 30 MG/ML IJ SOLN
30.0000 mg | Freq: Once | INTRAMUSCULAR | Status: AC
  Administered 2016-02-23: 30 mg via INTRAVENOUS
  Filled 2016-02-23: qty 1

## 2016-02-23 MED ORDER — SODIUM CHLORIDE 0.9 % IV BOLUS (SEPSIS)
1000.0000 mL | Freq: Once | INTRAVENOUS | Status: AC
Start: 2016-02-23 — End: 2016-02-23
  Administered 2016-02-23: 1000 mL via INTRAVENOUS

## 2016-02-23 MED ORDER — PROMETHAZINE HCL 25 MG/ML IJ SOLN
25.0000 mg | Freq: Once | INTRAMUSCULAR | Status: AC
  Administered 2016-02-23: 25 mg via INTRAVENOUS
  Filled 2016-02-23: qty 1

## 2016-02-23 MED ORDER — DIPHENHYDRAMINE HCL 50 MG/ML IJ SOLN
25.0000 mg | Freq: Once | INTRAMUSCULAR | Status: AC
  Administered 2016-02-23: 25 mg via INTRAVENOUS
  Filled 2016-02-23: qty 1

## 2016-02-23 NOTE — ED Notes (Signed)
Headache x 2 days. Nausea. Hx of migraine headaches.

## 2016-02-23 NOTE — Discharge Instructions (Signed)
Continue motrin and tylenol as needed for pain. Return for worsening symptoms, including fever, worsening pain, confusion, or any other symptoms concerning to you.  Migraine Headache A migraine headache is an intense, throbbing pain on one or both sides of your head. A migraine can last for 30 minutes to several hours. CAUSES  The exact cause of a migraine headache is not always known. However, a migraine may be caused when nerves in the brain become irritated and release chemicals that cause inflammation. This causes pain. Certain things may also trigger migraines, such as:  Alcohol.  Smoking.  Stress.  Menstruation.  Aged cheeses.  Foods or drinks that contain nitrates, glutamate, aspartame, or tyramine.  Lack of sleep.  Chocolate.  Caffeine.  Hunger.  Physical exertion.  Fatigue.  Medicines used to treat chest pain (nitroglycerine), birth control pills, estrogen, and some blood pressure medicines. SIGNS AND SYMPTOMS  Pain on one or both sides of your head.  Pulsating or throbbing pain.  Severe pain that prevents daily activities.  Pain that is aggravated by any physical activity.  Nausea, vomiting, or both.  Dizziness.  Pain with exposure to bright lights, loud noises, or activity.  General sensitivity to bright lights, loud noises, or smells. Before you get a migraine, you may get warning signs that a migraine is coming (aura). An aura may include:  Seeing flashing lights.  Seeing bright spots, halos, or zigzag lines.  Having tunnel vision or blurred vision.  Having feelings of numbness or tingling.  Having trouble talking.  Having muscle weakness. DIAGNOSIS  A migraine headache is often diagnosed based on:  Symptoms.  Physical exam.  A CT scan or MRI of your head. These imaging tests cannot diagnose migraines, but they can help rule out other causes of headaches. TREATMENT Medicines may be given for pain and nausea. Medicines can also be  given to help prevent recurrent migraines.  HOME CARE INSTRUCTIONS  Only take over-the-counter or prescription medicines for pain or discomfort as directed by your health care provider. The use of long-term narcotics is not recommended.  Lie down in a dark, quiet room when you have a migraine.  Keep a journal to find out what may trigger your migraine headaches. For example, write down:  What you eat and drink.  How much sleep you get.  Any change to your diet or medicines.  Limit alcohol consumption.  Quit smoking if you smoke.  Get 7-9 hours of sleep, or as recommended by your health care provider.  Limit stress.  Keep lights dim if bright lights bother you and make your migraines worse. SEEK IMMEDIATE MEDICAL CARE IF:   Your migraine becomes severe.  You have a fever.  You have a stiff neck.  You have vision loss.  You have muscular weakness or loss of muscle control.  You start losing your balance or have trouble walking.  You feel faint or pass out.  You have severe symptoms that are different from your first symptoms. MAKE SURE YOU:   Understand these instructions.  Will watch your condition.  Will get help right away if you are not doing well or get worse.   This information is not intended to replace advice given to you by your health care provider. Make sure you discuss any questions you have with your health care provider.   Document Released: 08/22/2005 Document Revised: 09/12/2014 Document Reviewed: 04/29/2013 Elsevier Interactive Patient Education [employer]2016 [employer]

## 2016-02-23 NOTE — ED Provider Notes (Signed)
CSN: [id number]     Arrival date & time 02/23/16  1342 History   First MD Initiated Contact with Patient 02/23/16 1436     Chief Complaint  Patient presents with  . Migraine     (Consider location/radiation/quality/duration/timing/severity/associated sxs/prior Treatment) HPI 25 year old female who presents with migraine headache. She has a history of migraines. States headache that has been ongoing since 3 days ago that is consistent with her typical migraine headaches. States that headache is retro-orbital, associated with photophobia, phonophobia, and nausea. No fevers or recent URI symptoms. No vomiting, confusion, neck pain or stiffness, focal numbness or weakness, vision or speech changes. Headache was not of sudden onset maximal intensity. Tried Tylenol at home, without relief of symptoms.   Past Medical History  Diagnosis Date  . Migraines   . Back pain   . Asthma   . PONV (postoperative nausea and vomiting)   . 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 No past surgeries     Family History  Problem Relation Age of Onset  . Hyperlipidemia Mother   . Hypertension Mother   . Hyperlipidemia Maternal Grandmother   . Hypertension Maternal Grandmother   . Cancer - Cervical Paternal Grandmother    Social History  Substance Use Topics  . Smoking status: Never Smoker   . Smokeless tobacco: Never Used  . 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  Home Medications   Prior to Admission medications   Medication Sig Start Date End Date Taking? Authorizing Provider  acetaminophen (TYLENOL) 500 MG tablet Take 1,000 mg by mouth every 6  (six) hours as needed for mild pain, moderate pain or headache.    Historical Provider, MD  albuterol (PROVENTIL HFA;VENTOLIN HFA) 108 (90 BASE) MCG/ACT inhaler Inhale 1-2 puffs into the lungs every 6 (six) hours as needed for wheezing or shortness of breath. 03/25/15   Allie Bossier, MD  beclomethasone (QVAR) 40 MCG/ACT inhaler Inhale 1 puff into the lungs 2 (two) times daily. 03/31/15   Lesly Dukes, MD  ibuprofen (ADVIL,MOTRIN) 600 MG tablet Take 1 tablet (600 mg total) by mouth every 6 (six) hours. 05/07/15   Kathrynn Running, MD  phenazopyridine (PYRIDIUM) 200 MG tablet 1 PO TID for 2 days 08/26/15   Allie Bossier, MD  prenatal vitamin w/FE, FA (PRENATAL 1 + 1) 27-1 MG TABS tablet Take 1 tablet by mouth daily. 12/01/14   Lisa A Leftwich-Kirby, CNM  sulfamethoxazole-trimethoprim (BACTRIM DS,SEPTRA DS) 800-160 MG tablet Take 1 tablet by mouth 2 (two) times daily. 08/28/15   Allie Bossier, MD  valACYclovir (VALTREX) 500 MG tablet TK 1 T PO  D CAN INCREASE TO TWICE A DAY FOR 5 DAYS IN THE EVENT OF RECURRENCE 03/31/15   Historical Provider, MD   BP 100/57 mmHg  Pulse 58  Temp(Src) 98.1 F (36.7 C) (Oral)  Resp 16  Ht  (1.651 m)  Wt 127 lb (57.607 kg)  BMI 21.13 kg/m2  SpO2 100%  LMP 02/19/2016 Physical Exam Physical Exam  Nursing note and vitals reviewed. Constitutional: Well developed, well nourished, non-toxic, and in no acute distress Head: Normocephalic and atraumatic.  Mouth/Throat: Oropharynx is clear and moist.  Neck: Normal range of motion. Neck supple. No meningismus. Cardiovascular: Normal rate and regular rhythm.   Pulmonary/Chest: Effort normal and breath sounds normal.  Abdominal: Soft. There is no tenderness. There is no rebound and no guarding.  Musculoskeletal: Normal range of motion.  Neurological: Alert, no facial droop, fluent speech, moves all extremities symmetrically, sensation to light touch in tac over face and bilateral upper and lower extremities, PERRL, EOMI, no  pronator drift, no dysmetria with finger to nose.  Skin: Skin is warm and dry.  Psychiatric: Cooperative   ED Course  Procedures (including critical care time) Labs Review Labs Reviewed  PREGNANCY, URINE    Imaging Review No results found. I have personally reviewed and evaluated these images and lab results as part of my medical decision-making.   EKG Interpretation None      MDM   Final diagnoses:  Migraine without aura and without status migrainosus, not intractable    25 year old female who presents with her typical migraine headache. Vital signs stable on presentation, she is well-appearing and in no acute distress. Neurologically intact. Presentation not concerning for that of intracranial infection, thrombosis, mass, bleeding or any other serious cause. Given IV fluids, Phenergan, Toradol, Benadryl. On reevaluation, headache fully resolved and she feels that she is back at her baseline. Appropriate for discharge home. Strict return and follow-up instructions reviewed. She expressed understanding of all discharge instructions and felt comfortable with the plan of care.    Lavera Guiseana Duo Liu, MD 02/23/16 1714

## 2016-10-24 ENCOUNTER — Emergency Department (INDEPENDENT_AMBULATORY_CARE_PROVIDER_SITE_OTHER)
Admission: EM | Admit: 2016-10-24 | Discharge: 2016-10-24 | Disposition: A | Discharge status: Home / Self Care | Payer: BLUE CROSS/BLUE SHIELD | Source: Home / Self Care | Attending: Family Medicine | Admitting: Family Medicine

## 2016-10-24 ENCOUNTER — Encounter: Payer: Self-pay | Admitting: Emergency Medicine

## 2016-10-24 DIAGNOSIS — R69 Illness, unspecified: Secondary | ICD-10-CM

## 2016-10-24 DIAGNOSIS — J111 Influenza due to unidentified influenza virus with other respiratory manifestations: Secondary | ICD-10-CM

## 2016-10-24 MED ORDER — OSELTAMIVIR PHOSPHATE 75 MG PO CAPS: 75.0000 mg | ORAL_CAPSULE | Freq: Two times a day (BID) | ORAL | 0 refills | Status: DC

## 2016-10-24 MED ORDER — ALBUTEROL SULFATE HFA 108 (90 BASE) MCG/ACT IN AERS: 1.0000 | INHALATION_SPRAY | Freq: Four times a day (QID) | RESPIRATORY_TRACT | 0 refills | Status: DC | PRN

## 2016-10-24 NOTE — Discharge Instructions (Signed)
°  You may take 400-600mg Ibuprofen (Motrin) every 6-8 hours for fever and pain  °Alternate with Tylenol  °You may take 500mg Tylenol every 4-6 hours as needed for fever and pain  °Follow-up with your primary care provider next week for recheck of symptoms if not improving.  °Be sure to drink plenty of fluids and rest, at least 8hrs of sleep a night, preferably more while you are sick. °Return urgent care or go to closest ER if you cannot keep down fluids/signs of dehydration, fever not reducing with Tylenol, difficulty breathing/wheezing, stiff neck, worsening condition, or other concerns (see below)  ° °Oseltamivir (Tamiflu) is an antiviral medication that can help decrease symptoms of the flu by about 1 days and lessen severity of symptoms.  It is best to start this medication within first 48 hours of symptoms, however, some studies have shown relief even after the 48 hour time frame.  This medication may cause stomach upset including nausea, vomiting and diarrhea.  It may also cause dizziness or hallucinations in children.  To help prevent stomach upset, you may take this medication with food.  If you are still having unwanted symptoms, you may stop taking this medication as it is not as important to finish the entire course like antibiotics.  If you have questions/concerns please call our office or follow up with your primary care provider.   ° °

## 2016-10-24 NOTE — ED Provider Notes (Signed)
CSN: [id number]     Arrival date & time 10/24/16  1517 History   First MD Initiated Contact with Patient 10/24/16 1534     Chief Complaint  Patient presents with  . Fever  . Generalized Body Aches  . Chills   (Consider location/radiation/quality/duration/timing/severity/associated sxs/prior Treatment) HPI Julia Morgan is a 26 y.o. female presenting to UC with c/o sudden onset generalized body aches, low grade fever and night sweats that started 2 days ago.  She took Alka-seltzer plus this morning with minimal relief. She notes her two children have had mild congestion and low grade fevers for about 1 day but were better the next day. She did not get the flu vaccine this year.  Hx of asthma. She has been using her inhaler more recently. Denies n/v/d. Denies chest pain or SOB.   Past Medical History:  Diagnosis Date  . Asthma   . Back pain   . 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 Migraines   . PONV (postoperative nausea and vomiting)    Past Surgical History:  Procedure Laterality Date  . NO PAST SURGERIES     Family History  Problem Relation Age of Onset  . Hyperlipidemia Mother   . Hypertension Mother   . Hyperlipidemia Maternal Grandmother   . Hypertension Maternal Grandmother   . Cancer - Cervical Paternal Grandmother    Social History  Substance Use Topics  . Smoking status: Never Smoker  . Smokeless tobacco: Never Used  . Alcohol use No     Comment: heavy drinker prior to this pregnancy   OB History    Gravida Para Term Preterm AB Living   2 2 2     2    SAB TAB Ectopic Multiple Live Births         0 2     Review of Systems  Constitutional: Positive for chills, diaphoresis, fatigue and fever. Negative for appetite change.  HENT: Positive for congestion. Negative for ear pain, sore throat, trouble swallowing and voice change.   Respiratory: Positive for cough. Negative for shortness of breath.   Cardiovascular: Negative for chest pain and palpitations.   Gastrointestinal: Negative for abdominal pain, diarrhea, nausea and vomiting.  Musculoskeletal: Positive for arthralgias, back pain and myalgias.  Skin: Negative for rash.  Neurological: Positive for headaches. Negative for dizziness and light-headedness.    Allergies  Patient has no known allergies.  Home Medications   Prior to Admission medications   Medication Sig Start Date End Date Taking? Authorizing Provider  acetaminophen (TYLENOL) 500 MG tablet Take 1,000 mg by mouth every 6 (six) hours as needed for mild pain, moderate pain or headache.    Historical Provider, MD  albuterol (PROVENTIL HFA;VENTOLIN HFA) 108 (90 BASE) MCG/ACT inhaler Inhale 1-2 puffs into the lungs every 6 (six) hours as needed for wheezing or shortness of breath. 03/25/15   Allie BossierMyra C Dove, MD  albuterol (PROVENTIL HFA;VENTOLIN HFA) 108 (90 Base) MCG/ACT inhaler Inhale 1-2 puffs into the lungs every 6 (six) hours as needed for wheezing or shortness of breath. 10/24/16   Junius FinnerErin O'Malley, PA-C  beclomethasone (QVAR) 40 MCG/ACT inhaler Inhale 1 puff into the lungs 2 (two) times daily. 03/31/15   Lesly DukesKelly H Leggett, MD  ibuprofen (ADVIL,MOTRIN) 600 MG tablet Take 1 tablet (600 mg total) by mouth every 6 (six) hours. 05/07/15   Kathrynn RunningNoah Bedford Wouk, MD  oseltamivir (TAMIFLU) 75 MG capsule Take 1 capsule (75 mg total) by mouth every 12 (twelve) hours. 10/24/16  Junius Finner, PA-C  phenazopyridine (PYRIDIUM) 200 MG tablet 1 PO TID for 2 days 08/26/15   Allie Bossier, MD  prenatal vitamin w/FE, FA (PRENATAL 1 + 1) 27-1 MG TABS tablet Take 1 tablet by mouth daily. 12/01/14   Lisa A Leftwich-Kirby, CNM  sulfamethoxazole-trimethoprim (BACTRIM DS,SEPTRA DS) 800-160 MG tablet Take 1 tablet by mouth 2 (two) times daily. 08/28/15   Allie Bossier, MD  valACYclovir (VALTREX) 500 MG tablet TK 1 T PO  D CAN INCREASE TO TWICE A DAY FOR 5 DAYS IN THE EVENT OF RECURRENCE 03/31/15   Historical Provider, MD   Meds Ordered and Administered this Visit   Medications - No data to display  BP 118/76 (BP Location: Left Arm)   Pulse 82   Temp 99.7 F (37.6 C) (Oral)   Resp 16   Ht 5\' 5"  (1.651 m)   Wt 127 lb (57.6 kg)   LMP 10/19/2016   SpO2 97%   BMI 21.13 kg/m  No data found.   Physical Exam  Constitutional: She is oriented to person, place, and time. She appears well-developed and well-nourished. No distress.  HENT:  Head: Normocephalic and atraumatic.  Right Ear: Tympanic membrane normal.  Left Ear: Tympanic membrane normal.  Nose: Nose normal.  Mouth/Throat: Uvula is midline, oropharynx is clear and moist and mucous membranes are normal.  Eyes: EOM are normal.  Neck: Normal range of motion. Neck supple.  Cardiovascular: Normal rate and regular rhythm.   Pulmonary/Chest: Effort normal and breath sounds normal. No stridor. No respiratory distress. She has no wheezes. She has no rales.  Musculoskeletal: Normal range of motion.  Lymphadenopathy:    She has no cervical adenopathy.  Neurological: She is alert and oriented to person, place, and time.  Skin: Skin is warm and dry. She is not diaphoretic.  Psychiatric: She has a normal mood and affect. Her behavior is normal.  Nursing note and vitals reviewed.   Urgent Care Course     Procedures (including critical care time)  Labs Review Labs Reviewed - No data to display  Imaging Review No results found.   MDM   1. Influenza-like illness    Hx and exam c/w influenza w/o evidence of underlying bacterial infection at this time.  Discussed risks/benefits of Tamiflu. Pt would like to try the treatment. Rx: Tamiflu   Encouraged fluids, rest, acetaminophen and ibuprofen.  Encouraged f/u with PCP in 1 week if not improving, sooner if worsening.      Junius Finner, PA-C 10/24/16 1607

## 2016-10-24 NOTE — ED Triage Notes (Signed)
Reports 2 days of general aches, chills, low grade fever. Took Alka-seltzer plus this morning.

## 2016-12-30 ENCOUNTER — Emergency Department (HOSPITAL_BASED_OUTPATIENT_CLINIC_OR_DEPARTMENT_OTHER)
Admission: EM | Admit: 2016-12-30 | Discharge: 2016-12-30 | Disposition: A | Discharge status: Home / Self Care | Payer: BLUE CROSS/BLUE SHIELD | Attending: Emergency Medicine | Admitting: Emergency Medicine

## 2016-12-30 ENCOUNTER — Encounter (HOSPITAL_BASED_OUTPATIENT_CLINIC_OR_DEPARTMENT_OTHER): Payer: Self-pay | Admitting: Emergency Medicine

## 2016-12-30 DIAGNOSIS — Z791 Long term (current) use of non-steroidal anti-inflammatories (NSAID): Secondary | ICD-10-CM | POA: Insufficient documentation

## 2016-12-30 DIAGNOSIS — J45909 Unspecified asthma, uncomplicated: Secondary | ICD-10-CM | POA: Insufficient documentation

## 2016-12-30 DIAGNOSIS — G43009 Migraine without aura, not intractable, without status migrainosus: Secondary | ICD-10-CM | POA: Insufficient documentation

## 2016-12-30 DIAGNOSIS — Z79899 Other long term (current) drug therapy: Secondary | ICD-10-CM | POA: Insufficient documentation

## 2016-12-30 MED ORDER — SODIUM CHLORIDE 0.9 % IV BOLUS (SEPSIS)
1000.0000 mL | Freq: Once | INTRAVENOUS | Status: AC
  Administered 2016-12-30: 1000 mL via INTRAVENOUS

## 2016-12-30 MED ORDER — METOCLOPRAMIDE HCL 5 MG/ML IJ SOLN
10.0000 mg | Freq: Once | INTRAMUSCULAR | Status: AC
  Administered 2016-12-30: 10 mg via INTRAVENOUS
  Filled 2016-12-30: qty 2

## 2016-12-30 MED ORDER — KETOROLAC TROMETHAMINE 30 MG/ML IJ SOLN
30.0000 mg | Freq: Once | INTRAMUSCULAR | Status: AC
  Administered 2016-12-30: 30 mg via INTRAVENOUS
  Filled 2016-12-30: qty 1

## 2016-12-30 MED ORDER — CETIRIZINE HCL 5 MG/5ML PO SYRP
5.0000 mg | ORAL_SOLUTION | Freq: Once | ORAL | Status: AC
  Administered 2016-12-30: 5 mg via ORAL
  Filled 2016-12-30: qty 5

## 2016-12-30 NOTE — Discharge Instructions (Signed)
It was our pleasure to provide your ER care today - we hope that you feel better.  Rest. Drink adequate fluids.  Take motrin or aleve as need for pain.  Follow up with primary care doctor in the next few days if symptoms fail to improve/resolve.  Return to ER if worse, severe pain, persistent vomiting, other concern.

## 2016-12-30 NOTE — ED Triage Notes (Signed)
Migraine since Monday, HX of migraines

## 2016-12-30 NOTE — ED Provider Notes (Signed)
[facility] Provider Note   CSN: [id number] Arrival date & time: 12/30/16  0906     History   Chief Complaint Chief Complaint  Patient presents with  . Migraine    HPI Julia Morgan is a 26 y.o. female.  Patient w hx migraines c/o having headache which she states feels like migraine. Onset a couple days ago, constant, dull, throbbing.  States was mild at onset, gradual in onset. No acute/abrupt change. Drove self to ED.  Denies any recent head injury, trauma or fall. No neck pain or stiffness. Denies eye pain or change in vision. No numbness/weakness, or change in normal functional ability. No fever or chills. Recent sinus congestion, ?allergy symptoms. No severe sinus pain. Hx migraines, feels same.    The history is provided by the patient.  Migraine  Pertinent negatives include no chest pain, no abdominal pain, no headaches and no shortness of breath.    Past Medical History:  Diagnosis Date  . Asthma   . Back pain   . 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 Migraines   . PONV (postoperative nausea and vomiting)     There are no active problems to display for this patient.   Past Surgical History:  Procedure Laterality Date  . NO PAST SURGERIES      OB History    Gravida Para Term Preterm AB Living   SAB TAB Ectopic Multiple Live Births         0 2       Home Medications    Prior to Admission medications   Medication Sig Start Date End Date Taking? Authorizing Provider  acetaminophen (TYLENOL) 500 MG tablet Take 1,000 mg by mouth every 6 (six) hours as needed for mild pain, moderate pain or headache.    Historical Provider, MD  albuterol (PROVENTIL HFA;VENTOLIN HFA) 108 (90 BASE) MCG/ACT inhaler Inhale 1-2 puffs into the lungs every 6 (six) hours as needed for wheezing or shortness of breath. 03/25/15   Allie Bossier, MD  albuterol (PROVENTIL HFA;VENTOLIN HFA) 108 (90 Base) MCG/ACT inhaler Inhale 1-2 puffs into the lungs every 6  (six) hours as needed for wheezing or shortness of breath. 10/24/16   Junius Finner, PA-C  beclomethasone (QVAR) 40 MCG/ACT inhaler Inhale 1 puff into the lungs 2 (two) times daily. 03/31/15   Lesly Dukes, MD  ibuprofen (ADVIL,MOTRIN) 600 MG tablet Take 1 tablet (600 mg total) by mouth every 6 (six) hours. 05/07/15   Kathrynn Running, MD  oseltamivir (TAMIFLU) 75 MG capsule Take 1 capsule (75 mg total) by mouth every 12 (twelve) hours. 10/24/16   Junius Finner, PA-C  phenazopyridine (PYRIDIUM) 200 MG tablet 1 PO TID for 2 days 08/26/15   Allie Bossier, MD  prenatal vitamin w/FE, FA (PRENATAL 1 + 1) 27-1 MG TABS tablet Take 1 tablet by mouth daily. 12/01/14   Lisa A Leftwich-Kirby, CNM  sulfamethoxazole-trimethoprim (BACTRIM DS,SEPTRA DS) 800-160 MG tablet Take 1 tablet by mouth 2 (two) times daily. 08/28/15   Allie Bossier, MD  valACYclovir (VALTREX) 500 MG tablet TK 1 T PO  D CAN INCREASE TO TWICE A DAY FOR 5 DAYS IN THE EVENT OF RECURRENCE 03/31/15   Historical Provider, MD    Family History Family History  Problem Relation Age of Onset  . Hyperlipidemia Mother   . Hypertension Mother   . Hyperlipidemia Maternal Grandmother   . Hypertension  Maternal Grandmother   . Cancer - Cervical Paternal Grandmother     Social History Social History  Substance Use Topics  . Smoking status: Never Smoker  . Smokeless tobacco: Never Used  . Alcohol use No     Comment: heavy drinker prior to this pregnancy     Allergies   Patient has no known allergies.   Review of Systems Review of Systems  Constitutional: Negative for chills and fever.  HENT: Positive for congestion. Negative for sinus pain and sore throat.   Eyes: Negative for redness.  Respiratory: Negative for shortness of breath.   Cardiovascular: Negative for chest pain.  Gastrointestinal: Negative for abdominal pain.  Genitourinary: Negative for flank pain.  Musculoskeletal: Negative for back pain, neck pain and neck stiffness.  Skin:  Negative for rash.  Neurological: Negative for syncope, speech difficulty, weakness, numbness and headaches.  Hematological: Does not bruise/bleed easily.  Psychiatric/Behavioral: Negative for confusion.     Physical Exam Updated Vital Signs BP 115/63 (BP Location: Right Arm)   Pulse (!) 56   Temp 98.7 F (37.1 C) (Oral)   Resp 16   Ht  (1.651 m)   Wt 57.6 kg   LMP 12/26/2016   SpO2 100%   BMI 21.13 kg/m   Physical Exam  Constitutional: She is oriented to person, place, and time. She appears well-developed and well-nourished. No distress.  HENT:  Head: Atraumatic.  Nose: Nose normal.  Mouth/Throat: Oropharynx is clear and moist.  Mild nasal congestion. No sinus or temporal tenderness.  Eyes: Conjunctivae and EOM are normal. Pupils are equal, round, and reactive to light. No scleral icterus.  Neck: Neck supple. No tracheal deviation present. No thyromegaly present.  No stiffness or rigidity.   Cardiovascular: Normal rate, regular rhythm, normal heart sounds and intact distal pulses.  Exam reveals no gallop and no friction rub.   No murmur heard. Pulmonary/Chest: Effort normal and breath sounds normal. No respiratory distress.  Abdominal: Soft. Normal appearance and bowel sounds are normal. She exhibits no distension. There is no tenderness.  Genitourinary:  Genitourinary Comments: No cva tenderness.  Musculoskeletal: Normal range of motion. She exhibits no edema or tenderness.  Neurological: She is alert and oriented to person, place, and time. No cranial nerve deficit.  Speech clear/fluent. Motor intact bilaterally. Steady gait.   Skin: Skin is warm and dry. No rash noted.  Psychiatric: She has a normal mood and affect.  Nursing note and vitals reviewed.    ED Treatments / Results  Labs (all labs ordered are listed, but only abnormal results are displayed) Labs Reviewed - No data to display  EKG  EKG Interpretation None       Radiology No results  found.  Procedures Procedures (including critical care time)  Medications Ordered in ED Medications  sodium chloride 0.9 % bolus 1,000 mL (1,000 mLs Intravenous New Bag/Given 12/30/16 0931)  ketorolac (TORADOL) 30 MG/ML injection 30 mg (30 mg Intravenous Given 12/30/16 0932)  metoCLOPramide (REGLAN) injection 10 mg (10 mg Intravenous Given 12/30/16 0932)  cetirizine HCl (Zyrtec) 5 MG/5ML syrup 5 mg (5 mg Oral Given 12/30/16 0932)     Initial Impression / Assessment and Plan / ED Course  I have reviewed the triage vital signs and the nursing notes.  Pertinent labs & imaging results that were available during my care of the patient were reviewed by me and considered in my medical decision making (see chart for details).  Pt notes nv w migraine. Iv ns bolus.  reglan iv. toradol iv.   Po fluids.  Reviewed nursing notes and prior charts for additional history.   Patient does not mild nasal congestion and possible recent allergy symptoms. Zyrtec po.  Recheck, headache resolved.  Pt appears stable for d/c.     Final Clinical Impressions(s) / ED Diagnoses   Final diagnoses:  None    New Prescriptions New Prescriptions   No medications on file     Cathren Laine, MD 12/30/16 1010

## 2017-01-15 ENCOUNTER — Encounter (HOSPITAL_BASED_OUTPATIENT_CLINIC_OR_DEPARTMENT_OTHER): Payer: Self-pay | Admitting: Emergency Medicine

## 2017-01-15 ENCOUNTER — Emergency Department (HOSPITAL_BASED_OUTPATIENT_CLINIC_OR_DEPARTMENT_OTHER)
Admission: EM | Admit: 2017-01-15 | Discharge: 2017-01-15 | Disposition: A | Discharge status: Home / Self Care | Payer: BLUE CROSS/BLUE SHIELD | Attending: Emergency Medicine | Admitting: Emergency Medicine

## 2017-01-15 DIAGNOSIS — J45909 Unspecified asthma, uncomplicated: Secondary | ICD-10-CM | POA: Insufficient documentation

## 2017-01-15 DIAGNOSIS — H1031 Unspecified acute conjunctivitis, right eye: Secondary | ICD-10-CM | POA: Insufficient documentation

## 2017-01-15 MED ORDER — POLYMYXIN B-TRIMETHOPRIM 10000-0.1 UNIT/ML-% OP SOLN: 1.0000 [drp] | OPHTHALMIC | 0 refills | Status: DC

## 2017-01-15 MED ORDER — FLUORESCEIN SODIUM 0.6 MG OP STRP
1.0000 | ORAL_STRIP | Freq: Once | OPHTHALMIC | Status: AC
  Administered 2017-01-15: 1 via OPHTHALMIC
  Filled 2017-01-15: qty 1

## 2017-01-15 MED ORDER — TETRACAINE HCL 0.5 % OP SOLN
1.0000 [drp] | Freq: Once | OPHTHALMIC | Status: AC
  Administered 2017-01-15: 1 [drp] via OPHTHALMIC
  Filled 2017-01-15: qty 4

## 2017-01-15 NOTE — Discharge Instructions (Signed)
Use the eyedrops as directed.  Follow-up with one of the permit care doctor in the left low for further evaluation.  If your symptoms are not improving in 1-2 weeks he can follow-up with referred eye doctor.  Return to the emergency department for any worsening pain, vision changes, fever or any other worsening or concerning symptoms.  If you do not have a primary care doctor you see regularly, please you the list below. Please call them to arrange for follow-up.    No Primary Care Doctor Call [facility]  [phone] Other agencies that provide inexpensive medical care    [facility]  [phone]    [facility]  [phone]    [facility]  [phone]    [facility]  [phone]    [facility]  [phone]    [facility]  [phone]

## 2017-01-15 NOTE — ED Triage Notes (Signed)
Eye irritation to R eye x 2 days. Noted to be red, pt c/o itchiness.

## 2017-01-15 NOTE — ED Provider Notes (Signed)
[facility]   CSN: [id number] Arrival date & time: 01/15/17  1159     History   Chief Complaint Chief Complaint  Patient presents with  . Eye Problem    HPI Julia Morgan is a 26 y.o. female who Presents with 3 days of right eye redness, itching and pain. Patient states that she has woken up in the morning with matted with crusted discharge. She states that her fianc has had the same symptoms for several days. Patient states that she has tried over-the-counter eyedrops no relief and improvement of symptoms. She denies any trauma or injury to the eye. She denies any vision changes, photophobia. Patient does not wear glasses or contacts.  The history is provided by the patient.    Past Medical History:  Diagnosis Date  . Asthma   . Back pain   . 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 Migraines   . PONV (postoperative nausea and vomiting)     There are no active problems to display for this patient.   Past Surgical History:  Procedure Laterality Date  . NO PAST SURGERIES      OB History    Gravida Para Term Preterm AB Living   2 2 2     2    SAB TAB Ectopic Multiple Live Births         0 2       Home Medications    Prior to Admission medications   Medication Sig Start Date End Date Taking? Authorizing Provider  acetaminophen (TYLENOL) 500 MG tablet Take 1,000 mg by mouth every 6 (six) hours as needed for mild pain, moderate pain or headache.    [provider]  albuterol (PROVENTIL HFA;VENTOLIN HFA) 108 (90 BASE) MCG/ACT inhaler Inhale 1-2 puffs into the lungs every 6 (six) hours as needed for wheezing or shortness of breath. 03/25/15   Dove, Myra C, MD  albuterol (PROVENTIL HFA;VENTOLIN HFA) 108 (90 Base) MCG/ACT inhaler Inhale 1-2 puffs into the lungs every 6 (six) hours as needed for wheezing or shortness of breath. 10/24/16   Junius Finner'Malley, Erin, PA-C  beclomethasone (QVAR) 40 MCG/ACT inhaler Inhale 1 puff into the lungs 2 (two)  times daily. 03/31/15   Lesly DukesLeggett, Kelly H, MD  ibuprofen (ADVIL,MOTRIN) 600 MG tablet Take 1 tablet (600 mg total) by mouth every 6 (six) hours. 05/07/15   Wouk, Wilfred CurtisNoah Bedford, MD  oseltamivir (TAMIFLU) 75 MG capsule Take 1 capsule (75 mg total) by mouth every 12 (twelve) hours. 10/24/16   Junius Finner'Malley, Erin, PA-C  phenazopyridine (PYRIDIUM) 200 MG tablet 1 PO TID for 2 days 08/26/15   Allie Bossierove, Myra C, MD  prenatal vitamin w/FE, FA (PRENATAL 1 + 1) 27-1 MG TABS tablet Take 1 tablet by mouth daily. 12/01/14   Leftwich-Kirby, Wilmer FloorLisa A, CNM  sulfamethoxazole-trimethoprim (BACTRIM DS,SEPTRA DS) 800-160 MG tablet Take 1 tablet by mouth 2 (two) times daily. 08/28/15   Allie Bossierove, Myra C, MD  trimethoprim-polymyxin b (POLYTRIM) ophthalmic solution Place 1 drop into the right eye every 4 (four) hours. 01/15/17   Maxwell CaulLayden, Shala Baumbach A, PA-C  valACYclovir (VALTREX) 500 MG tablet TK 1 T PO  D CAN INCREASE TO TWICE A DAY FOR 5 DAYS IN THE EVENT OF RECURRENCE 03/31/15   [provider]    Family History Family History  Problem Relation Age of Onset  . Hyperlipidemia Mother   . Hypertension Mother   . Hyperlipidemia Maternal Grandmother   . Hypertension Maternal Grandmother   .  Cancer - Cervical Paternal Grandmother     Social History Social History  Substance Use Topics  . Smoking status: Never Smoker  . Smokeless tobacco: Never Used  . Alcohol use No     Comment: heavy drinker prior to this pregnancy     Allergies   Patient has no known allergies.   Review of Systems Review of Systems  Constitutional: Negative for fever.  Eyes: Positive for pain, discharge, redness and itching. Negative for photophobia and visual disturbance.     Physical Exam Updated Vital Signs BP 118/70 (BP Location: Left Arm)   Pulse 82   Temp 99 F (37.2 C) (Oral)   Resp 16   Ht 5\' 5"  (1.651 m)   Wt 56.7 kg   LMP 12/26/2016   SpO2 100%   BMI 20.80 kg/m   Physical Exam  Constitutional: She appears well-developed and  well-nourished.  HENT:  Head: Normocephalic and atraumatic.  Eyes: EOM and lids are normal. Pupils are equal, round, and reactive to light. Right eye exhibits no discharge. Left eye exhibits no discharge. Right conjunctiva is injected. No scleral icterus.  Right eye with mild conjunctival injection. No discharge. EOMs intact without any pain or difficulty. No tenderness to palpation to periorbital region. Left eye is normal. No consensual pain.  Pulmonary/Chest: Effort normal.  Musculoskeletal: She exhibits no deformity.  Neurological: She is alert.  Skin: Skin is warm and dry.  Psychiatric: She has a normal mood and affect. Her speech is normal and behavior is normal.  Nursing note and vitals reviewed.    ED Treatments / Results  Labs (all labs ordered are listed, but only abnormal results are displayed) Labs Reviewed - No data to display  EKG  EKG Interpretation None       Radiology No results found.  Procedures Procedures (including critical care time)  Medications Ordered in ED Medications  tetracaine (PONTOCAINE) 0.5 % ophthalmic solution 1 drop (not administered)  fluorescein ophthalmic strip 1 strip (not administered)     Initial Impression / Assessment and Plan / ED Course  I have reviewed the triage vital signs and the nursing notes.  Pertinent labs & imaging results that were available during my care of the patient were reviewed by me and considered in my medical decision making (see chart for details).     26 year old female who presents with 3 days of right eye redness/itching/pain. Also history of discharge and crusting to right eye when waking up in the morning. States fianc has been having similar symptoms. Her fianc was evaluated diagnosed with pinkeye and has been given drops. Patient denies any vision changes or photophobia. Physical exam shows mild right conjunctival injection. EOMs intact without any difficulty. No consensual pain. Consider  conjunctivitis. History/physical exam are not concerning for iritis. Low suspicion for corneal abrasion given history/physical exam and lack of trauma. Will evaluate with the Woods lamp.  Woods lamp evaluation shows no corneal abrasion or fluorescein uptake. Symptoms likely result of conjunctivae. Will treat patient with antibiotic eyedrops. Patient given ophthalmology referral to follow-up with no improvement in symptoms in 1 week. Provided patient with a list of clinic resources to use if he does not have a PCP. Instructed to call them today to arrange follow-up in the next 24-48 hours. Strict return precautions given. Patient voices understanding and agreement to plan.   Final Clinical Impressions(s) / ED Diagnoses   Final diagnoses:  Acute conjunctivitis of right eye, unspecified acute conjunctivitis type    New Prescriptions  New Prescriptions   TRIMETHOPRIM-POLYMYXIN B (POLYTRIM) OPHTHALMIC SOLUTION    Place 1 drop into the right eye every 4 (four) hours.     Maxwell Caul, PA-C 01/15/17 1251    Alvira Monday, MD 01/18/17 1302

## 2017-06-07 ENCOUNTER — Encounter (HOSPITAL_COMMUNITY): Payer: Self-pay | Admitting: *Deleted

## 2017-06-07 ENCOUNTER — Inpatient Hospital Stay (HOSPITAL_COMMUNITY)
Admission: AD | Admit: 2017-06-07 | Discharge: 2017-06-07 | Disposition: A | Discharge status: Home / Self Care | Payer: Medicaid Other | Source: Ambulatory Visit | Attending: Obstetrics & Gynecology | Admitting: Obstetrics & Gynecology

## 2017-06-07 DIAGNOSIS — N949 Unspecified condition associated with female genital organs and menstrual cycle: Secondary | ICD-10-CM

## 2017-06-07 DIAGNOSIS — Z87891 Personal history of nicotine dependence: Secondary | ICD-10-CM | POA: Diagnosis not present

## 2017-06-07 DIAGNOSIS — B9689 Other specified bacterial agents as the cause of diseases classified elsewhere: Secondary | ICD-10-CM | POA: Insufficient documentation

## 2017-06-07 DIAGNOSIS — E860 Dehydration: Secondary | ICD-10-CM | POA: Insufficient documentation

## 2017-06-07 DIAGNOSIS — R109 Unspecified abdominal pain: Secondary | ICD-10-CM | POA: Diagnosis present

## 2017-06-07 DIAGNOSIS — Z3A14 14 weeks gestation of pregnancy: Secondary | ICD-10-CM | POA: Diagnosis not present

## 2017-06-07 DIAGNOSIS — O23592 Infection of other part of genital tract in pregnancy, second trimester: Secondary | ICD-10-CM | POA: Insufficient documentation

## 2017-06-07 DIAGNOSIS — N760 Acute vaginitis: Secondary | ICD-10-CM

## 2017-06-07 DIAGNOSIS — O26892 Other specified pregnancy related conditions, second trimester: Secondary | ICD-10-CM

## 2017-06-07 LAB — WET PREP, GENITAL
SPERM: NONE SEEN
Trich, Wet Prep: NONE SEEN
YEAST WET PREP: NONE SEEN

## 2017-06-07 LAB — URINALYSIS, ROUTINE W REFLEX MICROSCOPIC
BILIRUBIN URINE: NEGATIVE
Glucose, UA: NEGATIVE mg/dL
HGB URINE DIPSTICK: NEGATIVE
KETONES UR: 5 mg/dL — AB
Leukocytes, UA: NEGATIVE
Nitrite: NEGATIVE
PH: 7 (ref 5.0–8.0)
Protein, ur: NEGATIVE mg/dL
SPECIFIC GRAVITY, URINE: 1.014 (ref 1.005–1.030)

## 2017-06-07 MED ORDER — METRONIDAZOLE 500 MG PO TABS
2000.0000 mg | ORAL_TABLET | Freq: Once | ORAL | Status: AC
  Administered 2017-06-07: 2000 mg via ORAL
  Filled 2017-06-07: qty 4

## 2017-06-07 NOTE — MAU Note (Signed)
Is about 14 wks preg she thinks.  Has had no care at all.  Is having cramping, comes and goes.  Concerned her, wanted to make sure everything is ok. Frequency with urination, no pain.  No GI complaints.

## 2017-06-07 NOTE — MAU Provider Note (Signed)
History     CSN: [id number]  Arrival date and time: 06/07/17 1213   First Provider Initiated Contact with Patient 06/07/17 1348      No chief complaint on file.  HPI   Ms.Julia Morgan is a 26 y.o. female G3P2002 @ [redacted]w[redacted]d here in MAU with abdominal pain. She has not started prenatal care yet; is awaiting medicaid. The the pain is located in her lower abdomen. The pain comes and goes. She has not taken anything for the pain. She denies vaginal bleeding. Laying down makes the pain better. Walking makes the pain worse. The pain at times is stabbing.   OB History    Gravida Para Term Preterm AB Living   SAB TAB Ectopic Multiple Live Births         0 2      Past Medical History:  Diagnosis Date  . Asthma   . Back pain   . 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 Migraines   . PONV (postoperative nausea and vomiting)     Past Surgical History:  Procedure Laterality Date  . NO PAST SURGERIES      Family History  Problem Relation Age of Onset  . Hyperlipidemia Mother   . Hypertension Mother   . Hyperlipidemia Maternal Grandmother   . Hypertension Maternal Grandmother   . Cancer - Cervical Paternal Grandmother     Social History  Substance Use Topics  . Smoking status: Former Games developer  . Smokeless tobacco: Never Used  . 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 acetaminophen (TYLENOL) 500 MG tablet Take 1,000 mg by mouth every 6 (six) hours as needed for mild pain, moderate pain or headache.   Taking  . albuterol (PROVENTIL HFA;VENTOLIN HFA) 108 (90 BASE) MCG/ACT inhaler Inhale 1-2 puffs into the lungs every 6 (six) hours as needed for wheezing or shortness of breath. 1 Inhaler 0 Taking  . albuterol (PROVENTIL HFA;VENTOLIN HFA) 108 (90 Base) MCG/ACT inhaler Inhale 1-2 puffs into the lungs every 6 (six) hours as needed  for wheezing or shortness of breath. 1 Inhaler 0   . beclomethasone (QVAR) 40 MCG/ACT inhaler Inhale 1 puff into the lungs 2 (two) times daily. 1 Inhaler 12 Taking  . ibuprofen (ADVIL,MOTRIN) 600 MG tablet Take 1 tablet (600 mg total) by mouth every 6 (six) hours. 90 tablet 1 Taking  . oseltamivir (TAMIFLU) 75 MG capsule Take 1 capsule (75 mg total) by mouth every 12 (twelve) hours. 10 capsule 0   . phenazopyridine (PYRIDIUM) 200 MG tablet 1 PO TID for 2 days 6 tablet 0   . prenatal vitamin w/FE, FA (PRENATAL 1 + 1) 27-1 MG TABS tablet Take 1 tablet by mouth daily. 30 each 10 Taking  . sulfamethoxazole-trimethoprim (BACTRIM DS,SEPTRA DS) 800-160 MG tablet Take 1 tablet by mouth 2 (two) times daily. 14 tablet 1   . trimethoprim-polymyxin b (POLYTRIM) ophthalmic solution Place 1 drop into the right eye every 4 (four) hours. 10 mL 0   . valACYclovir (VALTREX) 500 MG tablet TK 1 T PO  D CAN INCREASE TO TWICE A DAY FOR 5 DAYS IN THE EVENT OF RECURRENCE  12    Results for orders placed or performed during the hospital encounter of 06/07/17 (from the past 48 hour(s))  Urinalysis, Routine w reflex microscopic     Status: Abnormal   Collection Time: 06/07/17 12:49 PM  Result Value Ref Range   Color, Urine YELLOW YELLOW   APPearance CLEAR CLEAR   Specific Gravity, Urine 1.014 1.005 - 1.030   pH 7.0 5.0 - 8.0   Glucose, UA NEGATIVE NEGATIVE mg/dL   Hgb urine dipstick NEGATIVE NEGATIVE   Bilirubin Urine NEGATIVE NEGATIVE   Ketones, ur 5 (A) NEGATIVE mg/dL   Protein, ur NEGATIVE NEGATIVE mg/dL   Nitrite NEGATIVE NEGATIVE   Leukocytes, UA NEGATIVE NEGATIVE  Wet prep, genital     Status: Abnormal   Collection Time: 06/07/17  1:52 PM  Result Value Ref Range   Yeast Wet Prep HPF POC NONE SEEN NONE SEEN   Trich, Wet Prep NONE SEEN NONE SEEN   Clue Cells Wet Prep HPF POC PRESENT (A) NONE SEEN   WBC, Wet Prep HPF POC FEW (A) NONE SEEN    Comment: MODERATE BACTERIA SEEN   Sperm NONE SEEN    Review of  Systems  Constitutional: Negative for fever.  Gastrointestinal: Positive for abdominal pain and nausea. Negative for constipation, diarrhea and vomiting.  Genitourinary: Negative for dysuria.   Physical Exam   Blood pressure 118/75, pulse 92, temperature 98.4 F (36.9 C), temperature source Oral, resp. rate 16, weight 128 lb 4 oz (58.2 kg), last menstrual period 02/27/2017, SpO2 100 %.  Physical Exam  Constitutional: She is oriented to person, place, and time. She appears well-developed and well-nourished. No distress.  HENT:  Head: Normocephalic.  Eyes: Pupils are equal, round, and reactive to light.  Respiratory: Effort normal.  GI: Soft. Normal appearance. There is tenderness in the suprapubic area. There is no rigidity and no guarding.  Genitourinary:  Genitourinary Comments: Wet prep and GC collected without speculum Bimanual exam: cervix closed, posterior   Musculoskeletal: Normal range of motion.  Neurological: She is alert and oriented to person, place, and time.  Skin: Skin is warm. She is not diaphoretic.  Psychiatric: Her behavior is normal.    MAU Course  Procedures  None  MDM  + fetal heart tones via doppler  Wet prep & GC UA with mild dehydration  Assessment and Plan   A:  1. Abdominal pain during pregnancy in second trimester   2. BV (bacterial vaginosis)   3. Round ligament pain   4. Mild dehydration     P:  Discharge home in stable condition Rx: Flagyl  Increase Oral hydration at home Pregnancy support belt recommended Return to MAU if symptoms worsen Start prenatal care.    Duane Lope, NP 06/07/2017 5:33 PM

## 2017-06-07 NOTE — Discharge Instructions (Signed)
Abdominal Pain During Pregnancy °Abdominal pain is common in pregnancy. Most of the time, it does not cause harm. There are many causes of abdominal pain. Some causes are more serious than others and sometimes the cause is not known. Abdominal pain can be a sign that something is very wrong with the pregnancy or the pain may have nothing to do with the pregnancy. Always tell your health care provider if you have any abdominal pain. °Follow these instructions at home: °· Do not have sex or put anything in your vagina until your symptoms go away completely. °· Watch your abdominal pain for any changes. °· Get plenty of rest until your pain improves. °· Drink enough fluid to keep your urine clear or pale yellow. °· Take over-the-counter or prescription medicines only as told by your health care provider. °· Keep all follow-up visits as told by your health care provider. This is important. °Contact a health care provider if: °· You have a fever. °· Your pain gets worse or you have cramping. °· Your pain continues after resting. °Get help right away if: °· You are bleeding, leaking fluid, or passing tissue from the vagina. °· You have vomiting or diarrhea that does not go away. °· You have painful or bloody urination. °· You notice a decrease in your baby's movements. °· You feel very weak or faint. °· You have shortness of breath. °· You develop a severe headache with abdominal pain. °· You have abnormal vaginal discharge with abdominal pain. °This information is not intended to replace advice given to you by your health care provider. Make sure you discuss any questions you have with your health care provider. °Document Released: 08/22/2005 Document Revised: 06/02/2016 Document Reviewed: 03/21/2013 °Elsevier Interactive Patient Education © 2018 Elsevier Inc. ° °Bacterial Vaginosis °Bacterial vaginosis is an infection of the vagina. It happens when too many germs (bacteria) grow in the vagina. This infection puts you at  risk for infections from sex (STIs). Treating this infection can lower your risk for some STIs. You should also treat this if you are pregnant. It can cause your baby to be born early. °Follow these instructions at home: °Medicines °· Take over-the-counter and prescription medicines only as told by your doctor. °· Take or use your antibiotic medicine as told by your doctor. Do not stop taking or using it even if you start to feel better. °General instructions °· If you your sexual partner is a woman, tell her that you have this infection. She needs to get treatment if she has symptoms. If you have a female partner, he does not need to be treated. °· During treatment: °? Avoid sex. °? Do not douche. °? Avoid alcohol as told. °? Avoid breastfeeding as told. °· Drink enough fluid to keep your pee (urine) clear or pale yellow. °· Keep your vagina and butt (rectum) clean. °? Wash the area with warm water every day. °? Wipe from front to back after you use the toilet. °· Keep all follow-up visits as told by your doctor. This is important. °Preventing this condition °· Do not douche. °· Use only warm water to wash around your vagina. °· Use protection when you have sex. This includes: °? Latex condoms. °? Dental dams. °· Limit how many people you have sex with. It is best to only have sex with the same person (be monogamous). °· Get tested for STIs. Have your partner get tested. °· Wear underwear that is cotton or lined with cotton. °· Avoid   tight pants and pantyhose. This is most important in summer. °· Do not use any products that have nicotine or tobacco in them. These include cigarettes and e-cigarettes. If you need help quitting, ask your doctor. °· Do not use illegal drugs. °· Limit how much alcohol you drink. °Contact a doctor if: °· Your symptoms do not get better, even after you are treated. °· You have more discharge or pain when you pee (urinate). °· You have a fever. °· You have pain in your belly  (abdomen). °· You have pain with sex. °· Your bleed from your vagina between periods. °Summary °· This infection happens when too many germs (bacteria) grow in the vagina. °· Treating this condition can lower your risk for some infections from sex (STIs). °· You should also treat this if you are pregnant. It can cause early (premature) birth. °· Do not stop taking or using your antibiotic medicine even if you start to feel better. °This information is not intended to replace advice given to you by your health care provider. Make sure you discuss any questions you have with your health care provider. °Document Released: 05/31/2008 Document Revised: 05/07/2016 Document Reviewed: 05/07/2016 °Elsevier Interactive Patient Education © 2017 Elsevier Inc. ° °

## 2017-06-08 LAB — GC/CHLAMYDIA PROBE AMP (~~LOC~~) NOT AT ARMC
CHLAMYDIA, DNA PROBE: NEGATIVE
NEISSERIA GONORRHEA: NEGATIVE

## 2017-06-08 LAB — HIV ANTIBODY (ROUTINE TESTING W REFLEX): HIV Screen 4th Generation wRfx: NONREACTIVE

## 2017-07-14 ENCOUNTER — Encounter: Payer: Self-pay | Admitting: Student

## 2017-07-14 ENCOUNTER — Other Ambulatory Visit (HOSPITAL_COMMUNITY)
Admission: RE | Admit: 2017-07-14 | Discharge: 2017-07-14 | Disposition: A | Discharge status: Home / Self Care | Payer: Medicaid Other | Source: Ambulatory Visit | Attending: Student | Admitting: Student

## 2017-07-14 ENCOUNTER — Ambulatory Visit (INDEPENDENT_AMBULATORY_CARE_PROVIDER_SITE_OTHER): Payer: Medicaid Other | Admitting: Student

## 2017-07-14 VITALS — BP 93/60 | HR 87 | Wt 131.0 lb

## 2017-07-14 DIAGNOSIS — Z23 Encounter for immunization: Secondary | ICD-10-CM

## 2017-07-14 DIAGNOSIS — Z3482 Encounter for supervision of other normal pregnancy, second trimester: Secondary | ICD-10-CM

## 2017-07-14 DIAGNOSIS — Z01419 Encounter for gynecological examination (general) (routine) without abnormal findings: Secondary | ICD-10-CM | POA: Insufficient documentation

## 2017-07-14 DIAGNOSIS — Z3480 Encounter for supervision of other normal pregnancy, unspecified trimester: Secondary | ICD-10-CM

## 2017-07-14 NOTE — Progress Notes (Signed)
  Subjective:    Julia Morgan is being seen today for her first obstetrical visit.  This is not a planned pregnancy. She is at 365w4d gestation. Her obstetrical history is significant for nothing. . Relationship with FOB: together but he is occasionally involved. . Patient does intend to breast feed. Pregnancy history fully reviewed.  Patient reports no complaints.  Review of Systems:   Review of Systems  All other systems reviewed and are negative.   Objective:     BP 93/60   Pulse 87   Wt 131 lb (59.4 kg)   LMP 02/27/2017   BMI 21.80 kg/m  Physical Exam  Constitutional: She appears well-developed.  HENT:  Head: Normocephalic.  Neck: Normal range of motion.  GI: Soft.  Musculoskeletal: Normal range of motion.  Neurological: She is alert.  Skin: Skin is warm and dry.   breast exam: benign; no lumps or masses.  Exam    Assessment:    Pregnancy: [id number] Patient Active Problem List   Diagnosis Date Noted  . Supervision of normal intrauterine pregnancy in multigravida in second trimester 07/14/2017       Plan:     Initial labs drawn. Prenatal vitamins. Problem list reviewed and updated. AFP3 discussed: declined. Role of ultrasound in pregnancy discussed; fetal survey: ordered. Amniocentesis discussed: not indicated. Follow up in 4 weeks. 50% of 30 min visit spent on counseling and coordination of care.     Julia Morgan 07/14/2017

## 2017-07-14 NOTE — Patient Instructions (Signed)

## 2017-07-16 LAB — CULTURE, URINE COMPREHENSIVE
MICRO NUMBER:: 81264685
SPECIMEN QUALITY: ADEQUATE

## 2017-07-17 LAB — OBSTETRIC PANEL
ANTIBODY SCREEN: NOT DETECTED
BASOS PCT: 0.5 %
Basophils Absolute: 28 cells/uL (ref 0–200)
EOS ABS: 231 {cells}/uL (ref 15–500)
Eosinophils Relative: 4.2 %
HEMATOCRIT: 30.9 % — AB (ref 35.0–45.0)
HEP B S AG: NONREACTIVE
Hemoglobin: 10.7 g/dL — ABNORMAL LOW (ref 11.7–15.5)
LYMPHS ABS: 1414 {cells}/uL (ref 850–3900)
MCH: 27.6 pg (ref 27.0–33.0)
MCHC: 34.6 g/dL (ref 32.0–36.0)
MCV: 79.6 fL — AB (ref 80.0–100.0)
MPV: 11.7 fL (ref 7.5–12.5)
Monocytes Relative: 6.9 %
NEUTROS PCT: 62.7 %
Neutro Abs: 3449 cells/uL (ref 1500–7800)
Platelets: 248 10*3/uL (ref 140–400)
RBC: 3.88 10*6/uL (ref 3.80–5.10)
RDW: 13.3 % (ref 11.0–15.0)
RPR: NONREACTIVE
RUBELLA: 11 {index}
Total Lymphocyte: 25.7 %
WBC: 5.5 10*3/uL (ref 3.8–10.8)
WBCMIX: 380 {cells}/uL (ref 200–950)

## 2017-07-17 LAB — GC/CHLAMYDIA PROBE AMP (~~LOC~~) NOT AT ARMC
Chlamydia: NEGATIVE
NEISSERIA GONORRHEA: NEGATIVE

## 2017-07-18 ENCOUNTER — Other Ambulatory Visit: Payer: Self-pay | Admitting: Student

## 2017-07-18 MED ORDER — IRON 325 (65 FE) MG PO TABS
1.0000 | ORAL_TABLET | Freq: Two times a day (BID) | ORAL | 0 refills | Status: DC
Start: 2017-07-18 — End: 2017-10-17

## 2017-07-19 ENCOUNTER — Other Ambulatory Visit: Payer: Self-pay | Admitting: Student

## 2017-07-19 ENCOUNTER — Ambulatory Visit (HOSPITAL_COMMUNITY)
Admission: RE | Admit: 2017-07-19 | Discharge: 2017-07-19 | Disposition: A | Discharge status: Home / Self Care | Payer: Medicaid Other | Source: Ambulatory Visit | Attending: Student | Admitting: Student

## 2017-07-19 ENCOUNTER — Telehealth: Payer: Self-pay | Admitting: *Deleted

## 2017-07-19 DIAGNOSIS — Z3480 Encounter for supervision of other normal pregnancy, unspecified trimester: Secondary | ICD-10-CM

## 2017-07-19 DIAGNOSIS — Z3689 Encounter for other specified antenatal screening: Secondary | ICD-10-CM | POA: Diagnosis present

## 2017-07-19 DIAGNOSIS — Z3A20 20 weeks gestation of pregnancy: Secondary | ICD-10-CM | POA: Diagnosis not present

## 2017-07-19 NOTE — Telephone Encounter (Signed)
Attempted to call pt on her cell phone but the number had been disconnected.  Put in sticky note to check telephone number for accuracy and discuss her anemia and need to be on Fe and Colace.

## 2017-07-19 NOTE — Telephone Encounter (Signed)
-----   Message from Marylene LandKathryn Lorraine Kooistra, CNM sent at 07/18/2017  4:49 PM EST ----- Please let the patient know she needs to start taking iron supplements; I've sent some to the pharmacy on file. Thank you! She should also take a stool softener as well in case the iron makes her constipated. Thank you!

## 2017-08-14 ENCOUNTER — Encounter: Payer: Medicaid Other | Admitting: Advanced Practice Midwife

## 2017-09-15 ENCOUNTER — Encounter: Payer: Medicaid Other | Admitting: Certified Nurse Midwife

## 2017-09-29 ENCOUNTER — Encounter: Payer: Medicaid Other | Admitting: Advanced Practice Midwife

## 2017-10-13 ENCOUNTER — Encounter: Payer: Medicaid Other | Admitting: Certified Nurse Midwife

## 2017-10-16 ENCOUNTER — Encounter: Payer: Self-pay | Admitting: Certified Nurse Midwife

## 2017-10-16 ENCOUNTER — Ambulatory Visit (INDEPENDENT_AMBULATORY_CARE_PROVIDER_SITE_OTHER): Payer: Medicaid Other | Admitting: Certified Nurse Midwife

## 2017-10-16 DIAGNOSIS — O0933 Supervision of pregnancy with insufficient antenatal care, third trimester: Secondary | ICD-10-CM

## 2017-10-16 DIAGNOSIS — Z3482 Encounter for supervision of other normal pregnancy, second trimester: Secondary | ICD-10-CM

## 2017-10-16 DIAGNOSIS — Z3483 Encounter for supervision of other normal pregnancy, third trimester: Secondary | ICD-10-CM

## 2017-10-16 DIAGNOSIS — O0930 Supervision of pregnancy with insufficient antenatal care, unspecified trimester: Secondary | ICD-10-CM

## 2017-10-16 DIAGNOSIS — Z3480 Encounter for supervision of other normal pregnancy, unspecified trimester: Secondary | ICD-10-CM

## 2017-10-16 MED ORDER — PREPLUS 27-1 MG PO TABS: 1.0000 | ORAL_TABLET | Freq: Every day | ORAL | 13 refills | Status: DC

## 2017-10-16 NOTE — Patient Instructions (Addendum)
Postpartum Tubal Ligation Postpartum tubal ligation (PPTL) is a procedure to close the fallopian tubes. This is done so that you cannot get pregnant. When the fallopian tubes are closed, the eggs that the ovaries release cannot enter the uterus, and sperm cannot reach the eggs. PPTL is done right after childbirth or 1-2 days after childbirth, before the uterus returns to its normal location. PPTL is sometimes called "getting your tubes tied." You should not have this procedure if you want to get pregnant someday or if you are unsure about having more children. Tell a health care provider about:  Any allergies you have.  All medicines you are taking, including vitamins, herbs, eye drops, creams, and over-the-counter medicines.  Previous problems you or members of your family have had with the use of anesthetics.  Any blood disorders you have.  Previous surgeries you have had.  Any medical conditions you may have.  Any past pregnancies. What are the risks? Generally, this is a safe procedure. However, problems may occur, including:  Infection.  Bleeding.  Injury to surrounding organs.  Side effects from anesthetics.  Failure of the procedure.  This procedure can increase your risk of a kind of pregnancy in which a fertilized egg attaches to the outside of the uterus (ectopic pregnancy). What happens before the procedure?  Ask your health care provider about: ? How much pain you can expect to have. ? What medicines you will be given for pain, especially if you are planning to breastfeed.  Follow instructions from your health care provider about eating and drinking restrictions. What happens during the procedure? If you had a vaginal delivery:  You may be given one or more of the following: ? A medicine that helps you relax (sedative). ? A medicine to numb the area (local anesthetic). ? A medicine to make you fall asleep (general anesthetic). ? A medicine that is injected  into an area of your body to numb everything below the injection site (regional anesthetic).  If you have been given a general anesthetic, a tube will be put down your throat to help you breathe.  An IV tube will be inserted into one of your veins to give you medicines and fluids during the procedure.  Your bladder may be emptied with a small tube (catheter).  An incision will be made just below your belly button.  Your fallopian tubes will be located and brought up through the incision.  Your fallopian tubes will be tied off, burned (cauterized), or blocked with a clip, ring, or clamp. A small portion in the center of each fallopian tube may be removed.  The incision will be closed with stitches (sutures).  A bandage (dressing) will be placed over the incision.  If you had a cesarean delivery:  Tubal ligation will be done through the incision that was used for the cesarean delivery of your baby.  The incision will be closed with sutures.  A dressing will be placed over the incision.  The procedure may vary among health care providers and hospitals. What happens after the procedure?  Your blood pressure, heart rate, breathing rate, and blood oxygen level will be monitored often until the medicines you were given have worn off.  You will be given pain medicine as needed.  Do not drive for 24 hours if you received a sedative. This information is not intended to replace advice given to you by your health care provider. Make sure you discuss any questions you have with your health  care provider. Document Released: 08/22/2005 Document Revised: 01/25/2016 Document Reviewed: 08/02/2015 Elsevier Interactive Patient Education  2018 Elsevier Inc.    Intrauterine Device Information An intrauterine device (IUD) is inserted into your uterus to prevent pregnancy. There are two types of IUDs available:  Copper IUD-This type of IUD is wrapped in copper wire and is placed inside the  uterus. Copper makes the uterus and fallopian tubes produce a fluid that kills sperm. The copper IUD can stay in place for 10 years.  Hormone IUD-This type of IUD contains the hormone progestin (synthetic progesterone). The hormone thickens the cervical mucus and prevents sperm from entering the uterus. It also thins the uterine lining to prevent implantation of a fertilized egg. The hormone can weaken or kill the sperm that get into the uterus. One type of hormone IUD can stay in place for 5 years, and another type can stay in place for 3 years.  Your health care provider will make sure you are a good candidate for a contraceptive IUD. Discuss with your health care provider the possible side effects. Advantages of an intrauterine device  IUDs are highly effective, reversible, long acting, and low maintenance.  There are no estrogen-related side effects.  An IUD can be used when breastfeeding.  IUDs are not associated with weight gain.  The copper IUD works immediately after insertion.  The hormone IUD works right away if inserted within 7 days of your period starting. You will need to use a backup method of birth control for 7 days if the hormone IUD is inserted at any other time in your cycle.  The copper IUD does not interfere with your female hormones.  The hormone IUD can make heavy menstrual periods lighter and decrease cramping.  The hormone IUD can be used for 3 or 5 years.  The copper IUD can be used for 10 years. Disadvantages of an intrauterine device  The hormone IUD can be associated with irregular bleeding patterns.  The copper IUD can make your menstrual flow heavier and more painful.  You may experience cramping and vaginal bleeding after insertion. This information is not intended to replace advice given to you by your health care provider. Make sure you discuss any questions you have with your health care provider. Document Released: 07/26/2004 Document Revised:  01/28/2016 Document Reviewed: 02/10/2013 Elsevier Interactive Patient Education  2017 Elsevier Inc.   

## 2017-10-16 NOTE — Progress Notes (Signed)
   PRENATAL VISIT NOTE  Subjective:  Julia Morgan is a 27 y.o. G3P2002 at 7774w0d being seen today for ongoing prenatal care.  She is currently monitored for the following issues for this low-risk pregnancy and has Supervision of normal intrauterine pregnancy in multigravida in second trimester and Limited prenatal care, antepartum on their problem list.  Patient reports no complaints.  Contractions: Irritability. Vag. Bleeding: None.  Movement: Present. Denies leaking of fluid.   The following portions of the patient's history were reviewed and updated as appropriate: allergies, current medications, past family history, past medical history, past social history, past surgical history and problem list. Problem list updated.  Objective:   Vitals:   10/16/17 0843  Weight: 142 lb (64.4 kg)    Fetal Status: Fetal Heart Rate (bpm): 143 Fundal Height: 31 cm Movement: Present     General:  Alert, oriented and cooperative. Patient is in no acute distress.  Skin: Skin is warm and dry. No rash noted.   Cardiovascular: Normal heart rate noted  Respiratory: Normal respiratory effort, no problems with respiration noted  Abdomen: Soft, gravid, appropriate for gestational age.  Pain/Pressure: Present     Pelvic: Cervical exam deferred        Extremities: Normal range of motion.  Edema: None  Mental Status:  Normal mood and affect. Normal behavior. Normal judgment and thought content.   Assessment and Plan:  Pregnancy: G3P2002 at 5974w0d  1. Supervision of other normal pregnancy, antepartum - Prenatal Vit-Fe Fumarate-FA (PREPLUS) 27-1 MG TABS; Take 1 tablet by mouth daily.  Dispense: 30 tablet; Refill: 13 - Glucose Tolerance, 1 HR (50g) - HIV antibody (with reflex) - CBC - RPR - Culture, OB Urine  2. Limited prenatal care, antepartum -has not been seen for prenatal care since November due to "does not like going to the dr" -Discussed importance of coming to prenatal visits, pt agrees to  coming to upcoming appointments and not missing any.   3. Supervision of normal intrauterine pregnancy in multigravida in second trimester -anticipatory guidance on GBS screening at next visit    Preterm labor symptoms and general obstetric precautions including but not limited to vaginal bleeding, contractions, leaking of fluid and fetal movement were reviewed in detail with the patient. Please refer to After Visit Summary for other counseling recommendations.  Return in about 2 weeks (around 10/30/2017) for ROB.   Sharyon CableVeronica C Jebediah Macrae, CNM

## 2017-10-17 ENCOUNTER — Other Ambulatory Visit: Payer: Self-pay | Admitting: Certified Nurse Midwife

## 2017-10-17 ENCOUNTER — Telehealth: Payer: Self-pay

## 2017-10-17 DIAGNOSIS — D509 Iron deficiency anemia, unspecified: Secondary | ICD-10-CM | POA: Insufficient documentation

## 2017-10-17 DIAGNOSIS — O99019 Anemia complicating pregnancy, unspecified trimester: Principal | ICD-10-CM

## 2017-10-17 LAB — CBC
HCT: 25.7 % — ABNORMAL LOW (ref 35.0–45.0)
Hemoglobin: 8.9 g/dL — ABNORMAL LOW (ref 11.7–15.5)
MCH: 27.2 pg (ref 27.0–33.0)
MCHC: 34.6 g/dL (ref 32.0–36.0)
MCV: 78.6 fL — ABNORMAL LOW (ref 80.0–100.0)
MPV: 11.7 fL (ref 7.5–12.5)
Platelets: 186 10*3/uL (ref 140–400)
RBC: 3.27 10*6/uL — ABNORMAL LOW (ref 3.80–5.10)
RDW: 13.4 % (ref 11.0–15.0)
WBC: 6.3 10*3/uL (ref 3.8–10.8)

## 2017-10-17 LAB — GLUCOSE TOLERANCE, 1 HOUR (50G) W/O FASTING: Glucose, 1 Hr, gestational: 112 mg/dL (ref 65–139)

## 2017-10-17 LAB — RPR: RPR Ser Ql: NONREACTIVE

## 2017-10-17 LAB — HIV ANTIBODY (ROUTINE TESTING W REFLEX): HIV 1&2 Ab, 4th Generation: NONREACTIVE

## 2017-10-17 MED ORDER — IRON 325 (65 FE) MG PO TABS: 1.0000 | ORAL_TABLET | Freq: Two times a day (BID) | ORAL | 2 refills | Status: DC

## 2017-10-17 NOTE — Telephone Encounter (Signed)
Attempted to call pt to let her know she passed 2 hour GTT and also to let her know doctor sent in iron to her pharmacy. The number was invalid and I could not get through.

## 2017-10-19 LAB — URINE CULTURE, OB REFLEX

## 2017-10-19 LAB — CULTURE, OB URINE

## 2017-11-03 ENCOUNTER — Ambulatory Visit (INDEPENDENT_AMBULATORY_CARE_PROVIDER_SITE_OTHER): Payer: Medicaid Other | Admitting: Advanced Practice Midwife

## 2017-11-03 ENCOUNTER — Encounter: Payer: Self-pay | Admitting: Advanced Practice Midwife

## 2017-11-03 ENCOUNTER — Inpatient Hospital Stay (HOSPITAL_COMMUNITY): Payer: Medicaid Other

## 2017-11-03 ENCOUNTER — Encounter (HOSPITAL_COMMUNITY): Payer: Self-pay

## 2017-11-03 ENCOUNTER — Other Ambulatory Visit: Payer: Self-pay

## 2017-11-03 ENCOUNTER — Inpatient Hospital Stay (HOSPITAL_COMMUNITY)
Admission: AD | Admit: 2017-11-03 | Discharge: 2017-11-03 | Disposition: A | Discharge status: Home / Self Care | Payer: Medicaid Other | Source: Ambulatory Visit | Attending: Obstetrics & Gynecology | Admitting: Obstetrics & Gynecology

## 2017-11-03 VITALS — BP 100/62 | HR 98 | Wt 140.0 lb

## 2017-11-03 DIAGNOSIS — Z23 Encounter for immunization: Secondary | ICD-10-CM | POA: Diagnosis not present

## 2017-11-03 DIAGNOSIS — F329 Major depressive disorder, single episode, unspecified: Secondary | ICD-10-CM

## 2017-11-03 DIAGNOSIS — R0602 Shortness of breath: Secondary | ICD-10-CM

## 2017-11-03 DIAGNOSIS — Z87891 Personal history of nicotine dependence: Secondary | ICD-10-CM | POA: Insufficient documentation

## 2017-11-03 DIAGNOSIS — O9989 Other specified diseases and conditions complicating pregnancy, childbirth and the puerperium: Secondary | ICD-10-CM

## 2017-11-03 DIAGNOSIS — O99340 Other mental disorders complicating pregnancy, unspecified trimester: Secondary | ICD-10-CM | POA: Insufficient documentation

## 2017-11-03 DIAGNOSIS — O99019 Anemia complicating pregnancy, unspecified trimester: Secondary | ICD-10-CM

## 2017-11-03 DIAGNOSIS — Z3482 Encounter for supervision of other normal pregnancy, second trimester: Secondary | ICD-10-CM

## 2017-11-03 DIAGNOSIS — O26893 Other specified pregnancy related conditions, third trimester: Secondary | ICD-10-CM | POA: Insufficient documentation

## 2017-11-03 DIAGNOSIS — Z3A35 35 weeks gestation of pregnancy: Secondary | ICD-10-CM | POA: Insufficient documentation

## 2017-11-03 DIAGNOSIS — O99013 Anemia complicating pregnancy, third trimester: Secondary | ICD-10-CM | POA: Diagnosis not present

## 2017-11-03 DIAGNOSIS — O0930 Supervision of pregnancy with insufficient antenatal care, unspecified trimester: Secondary | ICD-10-CM

## 2017-11-03 DIAGNOSIS — F32A Depression, unspecified: Secondary | ICD-10-CM

## 2017-11-03 DIAGNOSIS — D509 Iron deficiency anemia, unspecified: Secondary | ICD-10-CM | POA: Insufficient documentation

## 2017-11-03 DIAGNOSIS — O99343 Other mental disorders complicating pregnancy, third trimester: Secondary | ICD-10-CM

## 2017-11-03 LAB — COMPREHENSIVE METABOLIC PANEL
ALBUMIN: 3 g/dL — AB (ref 3.5–5.0)
ALT: 9 U/L — ABNORMAL LOW (ref 14–54)
ANION GAP: 6 (ref 5–15)
AST: 17 U/L (ref 15–41)
Alkaline Phosphatase: 98 U/L (ref 38–126)
BILIRUBIN TOTAL: 0.7 mg/dL (ref 0.3–1.2)
BUN: 6 mg/dL (ref 6–20)
CO2: 20 mmol/L — ABNORMAL LOW (ref 22–32)
Calcium: 8.1 mg/dL — ABNORMAL LOW (ref 8.9–10.3)
Chloride: 108 mmol/L (ref 101–111)
Creatinine, Ser: 0.35 mg/dL — ABNORMAL LOW (ref 0.44–1.00)
GFR calc Af Amer: >60 mL/min (ref >60)
GFR calc non Af Amer: >60 mL/min (ref >60)
Glucose, Bld: 72 mg/dL (ref 65–99)
POTASSIUM: 3.5 mmol/L (ref 3.5–5.1)
Sodium: 134 mmol/L — ABNORMAL LOW (ref 135–145)
TOTAL PROTEIN: 5.8 g/dL — AB (ref 6.5–8.1)

## 2017-11-03 LAB — CBC
HCT: 25.6 % — ABNORMAL LOW (ref 36.0–46.0)
Hemoglobin: 9.3 g/dL — ABNORMAL LOW (ref 12.0–15.0)
MCH: 28.1 pg (ref 26.0–34.0)
MCHC: 36.3 g/dL — AB (ref 30.0–36.0)
MCV: 77.3 fL — ABNORMAL LOW (ref 78.0–100.0)
Platelets: 154 10*3/uL (ref 150–400)
RBC: 3.31 MIL/uL — ABNORMAL LOW (ref 3.87–5.11)
RDW: 13.1 % (ref 11.5–15.5)
WBC: 6 10*3/uL (ref 4.0–10.5)

## 2017-11-03 MED ORDER — ALBUTEROL SULFATE (2.5 MG/3ML) 0.083% IN NEBU
2.5000 mg | INHALATION_SOLUTION | Freq: Once | RESPIRATORY_TRACT | Status: AC
  Administered 2017-11-03: 2.5 mg via RESPIRATORY_TRACT
  Filled 2017-11-03: qty 3

## 2017-11-03 MED ORDER — METHYLPREDNISOLONE 4 MG PO TBPK: ORAL_TABLET | ORAL | 0 refills | Status: DC

## 2017-11-03 MED ORDER — IOPAMIDOL (ISOVUE-370) INJECTION 76%
100.0000 mL | Freq: Once | INTRAVENOUS | Status: AC | PRN
  Administered 2017-11-03: 100 mL via INTRAVENOUS

## 2017-11-03 MED ORDER — LACTATED RINGERS IV SOLN
INTRAVENOUS | Status: DC
  Administered 2017-11-03: 14:00:00 via INTRAVENOUS

## 2017-11-03 NOTE — Discharge Instructions (Signed)

## 2017-11-03 NOTE — MAU Note (Signed)
Pt sent to MAU by provider secondary c/o SOB.  Pt reports she's been SOB for several weeks even without activity.  Provider wants further eval. Denies VB or LOF.  Reports +FM.

## 2017-11-03 NOTE — MAU Provider Note (Signed)
History     CSN: [id number]  Arrival date and time: 11/03/17 1210   First Provider Initiated Contact with Patient 11/03/17 1301      Chief Complaint  Patient presents with  . Shortness of Breath   Shortness of Breath  This is a new problem. Episode onset: 2 weeks. The problem occurs intermittently. The problem has been gradually worsening. Nothing aggravates the symptoms. Risk factors: pregnancy  She has tried beta agonist inhalers for the symptoms. The treatment provided no relief. Her past medical history is significant for asthma.   Past Medical History:  Diagnosis Date  . Asthma   . Back pain   . 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 Migraines   . PONV (postoperative nausea and vomiting)     Past Surgical History:  Procedure Laterality Date  . NO PAST SURGERIES      Family History  Problem Relation Age of Onset  . Hyperlipidemia Mother   . Hypertension Mother   . Hyperlipidemia Maternal Grandmother   . Hypertension Maternal Grandmother   . Cancer - Cervical Paternal Grandmother     Social History   Tobacco Use  . Smoking status: Former Games developermoker  . Smokeless tobacco: Never Used  Substance Use Topics  . Alcohol use: No    Alcohol/week: 0.0 oz    Comment: heavy drinker prior to this pregnancy  . Drug use: Yes    Types: Marijuana    Comment: last use september 20    Allergies: No Known Allergies  Medications Prior to Admission  Medication Sig Dispense Refill Last Dose  . albuterol (PROVENTIL HFA;VENTOLIN HFA) 108 (90 BASE) MCG/ACT inhaler Inhale 1-2 puffs into the lungs every 6 (six) hours as needed for wheezing or shortness of breath. (Patient not taking: Reported on 07/14/2017) 1 Inhaler 0 Not Taking  . albuterol (PROVENTIL HFA;VENTOLIN HFA) 108 (90 Base) MCG/ACT inhaler Inhale 1-2 puffs into the lungs every 6 (six) hours as needed for wheezing or shortness of breath. (Patient not taking: Reported on 07/14/2017) 1 Inhaler 0 Not Taking  . Ferrous Sulfate  (IRON) 325 (65 Fe) MG TABS Take 1 tablet (325 mg total) by mouth 2 (two) times daily. Take with juice; avoid dairy 30 min before and after taking med. 60 each 2   . Prenatal Vit-Fe Fumarate-FA (PREPLUS) 27-1 MG TABS Take 1 tablet by mouth daily. 30 tablet 13     Review of Systems  Respiratory: Positive for shortness of breath.    Physical Exam   Blood pressure (!) 102/53, pulse 78, temperature 98.8 F (37.1 C), temperature source Oral, resp. rate (!) 24, height 5\' 5"  (1.651 m), weight 141 lb 4 oz (64.1 kg), last menstrual period 02/27/2017, SpO2 100 %.  Physical Exam  Nursing note and vitals reviewed. Constitutional: She is oriented to person, place, and time. She appears well-developed and well-nourished. No distress.  HENT:  Head: Normocephalic.  Cardiovascular: Normal rate.  Respiratory: Effort normal.  GI: Soft. There is no tenderness.  Neurological: She is alert and oriented to person, place, and time.  Skin: Skin is warm and dry.  Psychiatric: She has a normal mood and affect.   Results for orders placed or performed during the hospital encounter of 11/03/17 (from the past 24 hour(s))  CBC     Status: Abnormal   Collection Time: 11/03/17  1:30 PM  Result Value Ref Range   WBC 6.0 4.0 - 10.5 K/uL   RBC 3.31 (L) 3.87 - 5.11 MIL/uL  Hemoglobin 9.3 (L) 12.0 - 15.0 g/dL   HCT 78.2 (L) 95.6 - 21.3 %   MCV 77.3 (L) 78.0 - 100.0 fL   MCH 28.1 26.0 - 34.0 pg   MCHC 36.3 (H) 30.0 - 36.0 g/dL   RDW 08.6 57.8 - 46.9 %   Platelets 154 150 - 400 K/uL  Comprehensive metabolic panel     Status: Abnormal   Collection Time: 11/03/17  1:30 PM  Result Value Ref Range   Sodium 134 (L) 135 - 145 mmol/L   Potassium 3.5 3.5 - 5.1 mmol/L   Chloride 108 101 - 111 mmol/L   CO2 20 (L) 22 - 32 mmol/L   Glucose, Bld 72 65 - 99 mg/dL   BUN 6 6 - 20 mg/dL   Creatinine, Ser 6.29 (L) 0.44 - 1.00 mg/dL   Calcium 8.1 (L) 8.9 - 10.3 mg/dL   Total Protein 5.8 (L) 6.5 - 8.1 g/dL   Albumin 3.0 (L)  3.5 - 5.0 g/dL   AST 17 15 - 41 U/L   ALT 9 (L) 14 - 54 U/L   Alkaline Phosphatase 98 38 - 126 U/L   Total Bilirubin 0.7 0.3 - 1.2 mg/dL   GFR calc non Af Amer >60 >60 mL/min   GFR calc Af Amer >60 >60 mL/min   Anion gap 6 5 - 15   Ct Angio Chest Pe W And/or Wo Contrast  Result Date: 11/03/2017 CLINICAL DATA:  Shortness of breath for several weeks EXAM: CT ANGIOGRAPHY CHEST WITH CONTRAST TECHNIQUE: Multidetector CT imaging of the chest was performed using the standard protocol during bolus administration of intravenous contrast. Multiplanar CT image reconstructions and MIPs were obtained to evaluate the vascular anatomy. CONTRAST:  ISOVUE-370 IOPAMIDOL (ISOVUE-370) INJECTION 76% COMPARISON:  None. FINDINGS: Cardiovascular: Satisfactory opacification of the pulmonary arteries to the segmental level. No evidence of pulmonary embolism. Normal heart size. No pericardial effusion. Normal caliber thoracic aorta. No thoracic aortic dissection. Mediastinum/Nodes: No enlarged mediastinal, hilar, or axillary lymph nodes. Thyroid gland, trachea, and esophagus demonstrate no significant findings. Lungs/Pleura: Lungs are clear. No pleural effusion or pneumothorax. Upper Abdomen: No acute abnormality. Gravid uterus. Small hiatal hernia. Musculoskeletal: No chest wall abnormality. No acute or significant osseous findings. Review of the MIP images confirms the above findings. IMPRESSION: 1. No evidence pulmonary embolus. 2. Normal caliber thoracic aorta.  No thoracic aortic dissection. Electronically Signed   By: Elige Ko   On: 11/03/2017 14:45    FHT: 135, moderate with 15x15 accels, no decels Toco: no UCs  MAU Course  Procedures  MDM 1537: EKG reviewed with Dr. Burnice LoganKatrinka Blazing. CT normal. Will DC home.   Assessment and Plan   1. Shortness of breath   2. Iron deficiency anemia of pregnancy   3. Limited prenatal care, antepartum   4. [redacted] weeks gestation of pregnancy    DC home Comfort measures  reviewed  3rd Trimester precautions  PTL precautions  Fetal kick counts RX: medrol taper  Return to MAU as needed FU with OB as planned  Follow-up Information    [facility] [place] Follow up.   Specialty:  Obstetrics and Gynecology Contact information: [address] [place] [address] [phone]           Thressa Sheller 11/03/2017, 1:10 PM

## 2017-11-03 NOTE — Patient Instructions (Signed)
Shortness of Breath, Adult  Shortness of breath means you have trouble breathing. Your lungs are organs for breathing.  Follow these instructions at home:  Pay attention to any changes in your symptoms. Take these actions to help with your condition:  ? Do not smoke. Smoking can cause shortness of breath. If you need help to quit smoking, ask your doctor.  ? Avoid things that can make it harder to breathe, such as:  ? Mold.  ? Dust.  ? Air pollution.  ? Chemical smells.  ? Things that can cause allergy symptoms (allergens), if you have allergies.  ? Keep your living space clean and free of mold and dust.  ? Rest as needed. Slowly return to your usual activities.  ? Take over-the-counter and prescription medicines, including oxygen and inhaled medicines, only as told by your doctor.  ? Keep all follow-up visits as told by your doctor. This is important.  Contact a doctor if:  ? Your condition does not get better as soon as expected.  ? You have a hard time doing your normal activities, even after you rest.  ? You have new symptoms.  Get help right away if:  ? You have trouble breathing when you are resting.  ? You feel light-headed or you faint.  ? You have a cough that is not helped by medicines.  ? You cough up blood.  ? You have pain with breathing.  ? You have pain in your chest, arms, shoulders, or belly (abdomen).  ? You have a fever.  ? You cannot walk up stairs.  ? You cannot exercise the way you normally do.  This information is not intended to replace advice given to you by your health care provider. Make sure you discuss any questions you have with your health care provider.  Document Released: 02/08/2008 Document Revised: 09/08/2016 Document Reviewed: 09/08/2016  Elsevier Interactive Patient Education ? 2017 Elsevier Inc.

## 2017-11-03 NOTE — Progress Notes (Signed)
10:37   Pulse Ox 94 %  BP 94/52 p-76        10:59  Pulse Ox 94%  BP 97/47  P-81

## 2017-11-03 NOTE — Progress Notes (Signed)
   PRENATAL VISIT NOTE  Subjective:  Latina Craverdrianne Wedemeyer is a 27 y.o. G3P2002 at 5075w4d being seen today for ongoing prenatal care.  She is currently monitored for the following issues for this low-risk pregnancy and has Supervision of normal intrauterine pregnancy in multigravida in second trimester; Limited prenatal care, antepartum; Iron deficiency anemia of pregnancy; and Depression affecting pregnancy, antepartum on their problem list.  Patient reports SOB x severeal weeks. No wheezing, cough, fever, chills, hemoptysis, calf pain or swelling. No improvement w/ Albuterol or occasional inhaled steroid. Also CO worsening depression. No SI/HI, just feeling sad and lonely. FOB not involved. Mother and grandmother are her support.  Contractions: Irritability. Vag. Bleeding: Small.  Movement: Present. Denies leaking of fluid.   The following portions of the patient's history were reviewed and updated as appropriate: allergies, current medications, past family history, past medical history, past social history, past surgical history and problem list. Problem list updated.  Objective:   Vitals:   11/03/17 0950  BP: 100/62  Pulse: 98  Weight: 140 lb (63.5 kg)    Fetal Status: Fetal Heart Rate (bpm): 148 Fundal Height: 35 cm Movement: Present     NST 120, mod variability, 15x15 acce;s, no decels.  Toco: Rare. Mild UC's General:  Alert, oriented and cooperative. Patient is in no acute distress.  Skin: Skin is warm and dry. No rash or pallor noted.   Cardiovascular: Normal heart rate noted. No M'/R/G  Respiratory: Normal respiratory effort, mild tachypnea. Lungs clear, but ?decreased BS biltal bases.   Abdomen: Soft, gravid, appropriate for gestational age.  Pain/Pressure: Present     Pelvic: Cervical exam performed Dilation: Closed Effacement (%): 0 Station: -3  Extremities: Normal range of motion.  Edema: None  Mental Status:  Normal mood and affect. Normal behavior. Normal judgment and thought  content.   Assessment and Plan:  Pregnancy: G3P2002 at 5975w4d  1. SOB (shortness of breath). Pt is stable, but her Sx are more than would be expected for normal pregnancy SOB. Low suspicion for PE 2/2 absence of tachycardia, calf pain. Question if she needs Nebs and Steroid IM or taper.  - Referred to Urgent Care/ED per consult w/ Dr. Shawnie PonsPratt. Pt has been cleared obstetrically  - Rec obtaining PCP for routine asthma management   2. Depression affecting pregnancy, antepartum - Referred to IBH  3. Supervision of normal intrauterine pregnancy in multigravida in second trimester   Preterm labor symptoms and general obstetric precautions including but not limited to vaginal bleeding, contractions, leaking of fluid and fetal movement were reviewed in detail with the patient. Please refer to After Visit Summary for other counseling recommendations.  Return in about 1 week (around 11/10/2017) for ROB.   Dorathy KinsmanVirginia Jacksen Isip, CNM

## 2017-11-03 NOTE — MAU Note (Signed)
Pt is a G3P2 at 35.4 weeks c/o SOB that has significantly increased in the past day.  Pt reports SOB with previous pregnancies "but this time is worse".  Pt also reports having asthma and has used inhaler with no relief,  She states that this does not feel like asthma.  No OB concerns at this time.

## 2017-11-14 NOTE — BH Specialist Note (Deleted)
Integrated Behavioral Health Initial Visit  MRN: [id number] Name: Julia Morgan  Number of Integrated Behavioral Health Clinician visits:: 1/6 Session Start time: ***  Session End time: *** Total time: {IBH Total Time:21014050}  Type of Service: Integrated Behavioral Health- Individual/Family Interpretor:No. Interpretor Name and Language: n/a   Warm Hand Off Completed.       SUBJECTIVE: Julia Morgan is a 27 y.o. female accompanied by {CHL AMB ACCOMPANIED WU:[id number]}BY:[phone]} Patient was referred by Dorathy KinsmanVirginia Smith, CNM for depression affecting pregnancy. Patient reports the following symptoms/concerns: *** Duration of problem: ***; Severity of problem: {Mild/Moderate/Severe:20260}  OBJECTIVE: Mood: {BHH MOOD:22306} and Affect: {BHH AFFECT:22307} Risk of harm to self or others: {CHL AMB BH Suicide Current Mental Status:21022748}  LIFE CONTEXT: Family and Social: *** School/Work: *** Self-Care: *** Life Changes: Current pregnancy  GOALS ADDRESSED: Patient will: 1. Reduce symptoms of: {IBH Symptoms:21014056} 2. Increase knowledge and/or ability of: {IBH Patient Tools:21014057}  3. Demonstrate ability to: {IBH Goals:21014053}  INTERVENTIONS: Interventions utilized: {IBH Interventions:21014054}  Standardized Assessments completed: GAD-7 and PHQ 9  ASSESSMENT: Patient currently experiencing ***.   Patient may benefit from psychoeducation and brief therapeutic interventions regarding coping with symptoms of *** .  PLAN: 1. Follow up with behavioral health clinician on : *** 2. Behavioral recommendations: *** 3. Referral(s): {IBH Referrals:21014055} 4. "From scale of 1-10, how likely are you to follow plan?": ***  Valetta CloseJamie C McMannes, LCSW

## 2017-11-15 ENCOUNTER — Other Ambulatory Visit (HOSPITAL_COMMUNITY)
Admission: RE | Admit: 2017-11-15 | Discharge: 2017-11-15 | Disposition: A | Discharge status: Home / Self Care | Payer: Medicaid Other | Source: Ambulatory Visit | Attending: Obstetrics and Gynecology | Admitting: Obstetrics and Gynecology

## 2017-11-15 ENCOUNTER — Encounter: Payer: Self-pay | Admitting: Obstetrics and Gynecology

## 2017-11-15 ENCOUNTER — Ambulatory Visit (INDEPENDENT_AMBULATORY_CARE_PROVIDER_SITE_OTHER): Payer: Medicaid Other | Admitting: Obstetrics and Gynecology

## 2017-11-15 VITALS — BP 106/69 | HR 95 | Wt 143.0 lb

## 2017-11-15 DIAGNOSIS — O99019 Anemia complicating pregnancy, unspecified trimester: Secondary | ICD-10-CM

## 2017-11-15 DIAGNOSIS — Z3482 Encounter for supervision of other normal pregnancy, second trimester: Secondary | ICD-10-CM | POA: Diagnosis not present

## 2017-11-15 DIAGNOSIS — F329 Major depressive disorder, single episode, unspecified: Secondary | ICD-10-CM

## 2017-11-15 DIAGNOSIS — O99340 Other mental disorders complicating pregnancy, unspecified trimester: Secondary | ICD-10-CM

## 2017-11-15 DIAGNOSIS — D509 Iron deficiency anemia, unspecified: Secondary | ICD-10-CM

## 2017-11-15 DIAGNOSIS — F32A Depression, unspecified: Secondary | ICD-10-CM

## 2017-11-15 MED ORDER — IRON 325 (65 FE) MG PO TABS: 1.0000 | ORAL_TABLET | Freq: Two times a day (BID) | ORAL | 2 refills | Status: DC

## 2017-11-15 MED ORDER — SERTRALINE HCL 50 MG PO TABS: 50.0000 mg | ORAL_TABLET | Freq: Every day | ORAL | 6 refills | Status: DC

## 2017-11-15 NOTE — Progress Notes (Signed)
   PRENATAL VISIT NOTE  Subjective:  Latina Craverdrianne Cohoon is a 27 y.o. G3P2002 at 648w2d being seen today for ongoing prenatal care.  She is currently monitored for the following issues for this high-risk pregnancy and has Supervision of normal intrauterine pregnancy in multigravida in second trimester; Limited prenatal care, antepartum; Iron deficiency anemia of pregnancy; and Depression affecting pregnancy, antepartum on their problem list.  Patient reports no complaints.  Contractions: Irritability. Vag. Bleeding: None.  Movement: Present. Denies leaking of fluid.   The following portions of the patient's history were reviewed and updated as appropriate: allergies, current medications, past family history, past medical history, past social history, past surgical history and problem list. Problem list updated.  Objective:   Vitals:   11/15/17 0956  BP: 106/69  Pulse: 95  Weight: 143 lb (64.9 kg)    Fetal Status: Fetal Heart Rate (bpm): 143 Fundal Height: 37 cm Movement: Present  Presentation: Vertex  General:  Alert, oriented and cooperative. Patient is in no acute distress.  Skin: Skin is warm and dry. No rash noted.   Cardiovascular: Normal heart rate noted  Respiratory: Normal respiratory effort, no problems with respiration noted  Abdomen: Soft, gravid, appropriate for gestational age.  Pain/Pressure: Present     Pelvic: Cervical exam performed Dilation: 1 Effacement (%): 50 Station: -3  Extremities: Normal range of motion.  Edema: None  Mental Status:  Normal mood and affect. Normal behavior. Normal judgment and thought content.   Assessment and Plan:  Pregnancy: G3P2002 at 2948w2d  1. Supervision of normal intrauterine pregnancy in multigravida in second trimester Patient is doing well Cultures collected - Culture, beta strep (group b only) - GC[facility]Chlamydia probe amp [facility]  2. Iron deficiency anemia of pregnancy Patient unaware of anemia Patient plans to  start iron supplement today  3. Depression affecting pregnancy, antepartum Patient scheduled to see behavioral health tomorrow She admits to depressive symptoms without suicidal ideations Rx zoloft provided  Term labor symptoms and general obstetric precautions including but not limited to vaginal bleeding, contractions, leaking of fluid and fetal movement were reviewed in detail with the patient. Please refer to After Visit Summary for other counseling recommendations.  Return in about 1 week (around 11/22/2017) for ROB.   Catalina AntiguaPeggy Tonya Carlile, MD

## 2017-11-16 LAB — GC/CHLAMYDIA PROBE AMP (~~LOC~~) NOT AT ARMC
CHLAMYDIA, DNA PROBE: NEGATIVE
NEISSERIA GONORRHEA: NEGATIVE

## 2017-11-17 ENCOUNTER — Telehealth: Payer: Self-pay | Admitting: Obstetrics and Gynecology

## 2017-11-17 ENCOUNTER — Telehealth: Payer: Self-pay | Admitting: Clinical

## 2017-11-17 NOTE — Telephone Encounter (Signed)
Pt called states having contractions 8-10 min apart & has some slightly bloody discharge. Pt req please advise if she should go to the hospital.

## 2017-11-17 NOTE — Telephone Encounter (Signed)
Returned pts' phone call only to get voicemail.  I left a message that she should try and wait until the contractions get 5 min apart, lasting 1 min for about an hour.  She needs to trying walking and make sure that she is hydrated.  If contractions are getting stronger and closer or she see more than bleeding discharge then [facility]

## 2017-11-17 NOTE — Telephone Encounter (Signed)
Integrated Behavioral Health Medication Management Phone Note  MRN: [id number] NAME: Julia Morgan  Time Call Initiated: 10:17 Time Call Completed: 10:20 Total Call Time: 3 minutes  Current Medications:  Outpatient Medications Prior to Visit  Medication Sig Dispense Refill  . albuterol (PROVENTIL HFA;VENTOLIN HFA) 108 (90 Base) MCG/ACT inhaler Inhale 1-2 puffs into the lungs every 6 (six) hours as needed for wheezing or shortness of breath. 1 Inhaler 0  . diphenhydramine-acetaminophen (TYLENOL PM) 25-500 MG TABS tablet Take 1 tablet by mouth at bedtime as needed (sleep).    . Ferrous Sulfate (IRON) 325 (65 Fe) MG TABS Take 1 tablet (325 mg total) by mouth 2 (two) times daily. Take with juice; avoid dairy 30 min before and after taking med. 60 each 2  . methylPREDNISolone (MEDROL DOSEPAK) 4 MG TBPK tablet Use as directed (Patient not taking: Reported on 11/15/2017) 21 tablet 0  . Prenatal Vit-Fe Fumarate-FA (PREPLUS) 27-1 MG TABS Take 1 tablet by mouth daily. 30 tablet 13  . ranitidine (ZANTAC) 150 MG tablet Take 150 mg by mouth 2 (two) times daily.    . sertraline (ZOLOFT) 50 MG tablet Take 1 tablet (50 mg total) by mouth daily. 30 tablet 6   No facility-administered medications prior to visit.     Patient has been able to get all medications filled as prescribed: Yes  Patient is currently taking all medications as prescribed: Yes  Patient reports experiencing side effects: No  Patient describes feeling this way on medications: Currently in labor  Additional patient concerns: No  Patient advised to schedule appointment with provider for evaluation of medication side effects or additional concerns: Yes- Pt says she wants to come in after she has the baby, and thinks she may be in early labor now, having contractions   Jamie C McMannes, LCSW

## 2017-11-18 LAB — CULTURE, BETA STREP (GROUP B ONLY)
MICRO NUMBER:: 90319393
SPECIMEN QUALITY: ADEQUATE

## 2017-11-22 ENCOUNTER — Inpatient Hospital Stay (HOSPITAL_COMMUNITY)
Admission: AD | Admit: 2017-11-22 | Discharge: 2017-11-22 | DRG: 833 | Disposition: A | Discharge status: Home / Self Care | Payer: Medicaid Other | Source: Ambulatory Visit | Attending: Obstetrics and Gynecology | Admitting: Obstetrics and Gynecology

## 2017-11-22 ENCOUNTER — Ambulatory Visit (INDEPENDENT_AMBULATORY_CARE_PROVIDER_SITE_OTHER): Payer: Medicaid Other | Admitting: Obstetrics & Gynecology

## 2017-11-22 ENCOUNTER — Other Ambulatory Visit: Payer: Self-pay

## 2017-11-22 ENCOUNTER — Encounter (HOSPITAL_COMMUNITY): Payer: Self-pay | Admitting: *Deleted

## 2017-11-22 VITALS — BP 109/69 | HR 96 | Wt 144.0 lb

## 2017-11-22 DIAGNOSIS — O479 False labor, unspecified: Secondary | ICD-10-CM

## 2017-11-22 DIAGNOSIS — Z3482 Encounter for supervision of other normal pregnancy, second trimester: Secondary | ICD-10-CM

## 2017-11-22 DIAGNOSIS — Z3A38 38 weeks gestation of pregnancy: Secondary | ICD-10-CM | POA: Diagnosis not present

## 2017-11-22 DIAGNOSIS — O471 False labor at or after 37 completed weeks of gestation: Secondary | ICD-10-CM | POA: Diagnosis present

## 2017-11-22 DIAGNOSIS — O99019 Anemia complicating pregnancy, unspecified trimester: Secondary | ICD-10-CM

## 2017-11-22 DIAGNOSIS — O0930 Supervision of pregnancy with insufficient antenatal care, unspecified trimester: Secondary | ICD-10-CM

## 2017-11-22 DIAGNOSIS — D509 Iron deficiency anemia, unspecified: Secondary | ICD-10-CM

## 2017-11-22 HISTORY — DX: Depression, unspecified: F32.A

## 2017-11-22 HISTORY — DX: Anemia, unspecified: D64.9

## 2017-11-22 HISTORY — DX: Major depressive disorder, single episode, unspecified: F32.9

## 2017-11-22 HISTORY — DX: Mental disorder, not otherwise specified: F99

## 2017-11-22 LAB — TYPE AND SCREEN
ABO/RH(D): B POS
ANTIBODY SCREEN: NEGATIVE

## 2017-11-22 LAB — CBC
HEMATOCRIT: 31.2 % — AB (ref 36.0–46.0)
Hemoglobin: 11 g/dL — ABNORMAL LOW (ref 12.0–15.0)
MCH: 27.5 pg (ref 26.0–34.0)
MCHC: 35.3 g/dL (ref 30.0–36.0)
MCV: 78 fL (ref 78.0–100.0)
Platelets: 176 10*3/uL (ref 150–400)
RBC: 4 MIL/uL (ref 3.87–5.11)
RDW: 13.8 % (ref 11.5–15.5)
WBC: 5.6 10*3/uL (ref 4.0–10.5)

## 2017-11-22 LAB — OB RESULTS CONSOLE GBS: STREP GROUP B AG: NEGATIVE

## 2017-11-22 MED ORDER — OXYCODONE-ACETAMINOPHEN 5-325 MG PO TABS: 1.0000 | ORAL_TABLET | ORAL | Status: DC | PRN

## 2017-11-22 MED ORDER — LACTATED RINGERS IV SOLN: 500.0000 mL | INTRAVENOUS | Status: DC | PRN

## 2017-11-22 MED ORDER — LACTATED RINGERS IV SOLN
INTRAVENOUS | Status: DC
  Administered 2017-11-22: 12:00:00 via INTRAVENOUS

## 2017-11-22 MED ORDER — TERBUTALINE SULFATE 1 MG/ML IJ SOLN: 0.2500 mg | Freq: Once | INTRAMUSCULAR | Status: DC | PRN

## 2017-11-22 MED ORDER — SOD CITRATE-CITRIC ACID 500-334 MG/5ML PO SOLN: 30.0000 mL | ORAL | Status: DC | PRN

## 2017-11-22 MED ORDER — LIDOCAINE HCL (PF) 1 % IJ SOLN: 30.0000 mL | INTRAMUSCULAR | Status: DC | PRN

## 2017-11-22 MED ORDER — OXYTOCIN 40 UNITS IN LACTATED RINGERS INFUSION - SIMPLE MED
2.5000 [IU]/h | INTRAVENOUS | Status: DC
  Filled 2017-11-22: qty 1000

## 2017-11-22 MED ORDER — FENTANYL CITRATE (PF) 100 MCG/2ML IJ SOLN: 100.0000 ug | INTRAMUSCULAR | Status: DC | PRN

## 2017-11-22 MED ORDER — ONDANSETRON HCL 4 MG/2ML IJ SOLN: 4.0000 mg | Freq: Four times a day (QID) | INTRAMUSCULAR | Status: DC | PRN

## 2017-11-22 MED ORDER — FLEET ENEMA 7-19 GM/118ML RE ENEM: 1.0000 | ENEMA | RECTAL | Status: DC | PRN

## 2017-11-22 MED ORDER — OXYTOCIN 40 UNITS IN LACTATED RINGERS INFUSION - SIMPLE MED: 1.0000 m[IU]/min | INTRAVENOUS | Status: DC

## 2017-11-22 MED ORDER — OXYCODONE-ACETAMINOPHEN 5-325 MG PO TABS: 2.0000 | ORAL_TABLET | ORAL | Status: DC | PRN

## 2017-11-22 MED ORDER — ACETAMINOPHEN 325 MG PO TABS: 650.0000 mg | ORAL_TABLET | ORAL | Status: DC | PRN

## 2017-11-22 MED ORDER — OXYTOCIN BOLUS FROM INFUSION: 500.0000 mL | Freq: Once | INTRAVENOUS | Status: DC

## 2017-11-22 NOTE — Progress Notes (Signed)
  Julia Morgan is a 27 y.o. G3P2002 at 5253w2d presented for possible spontaneous onset of labor.   S:  Patient comfortable. Reporting intermittent contractions and "not feeling much."  O:  BP 110/65   Pulse 67   Temp 97.7 F (36.5 C) (Oral)   Resp 18   Ht 5\' 5"  (1.651 m)   Wt 144 lb (65.3 kg)   LMP 02/27/2017   BMI 23.96 kg/m   Fetal Tracing:  Baseline: 125 Variability: moderate Accels: 15x15 Decels: none  Toco: irregular  CVE: Dilation: 4.5 Effacement (%): 60 Cervical Position: Posterior Station: -2 Presentation: Vertex Exam by:: Ma Hillock. Medina Degraffenreid CNM   A&P: 27 y.o. [id number] 6953w2d early labor  -Discussed with Dr. Ashok PallWouk- no cervical change since exam in the office. Unable to augment labor due to patient being less than 39 weeks. Will observe for 4 hours and recheck cervix. If unchanged, will consider discharge home and wait for labor.   Julia Morgan, CNM 12:06 PM

## 2017-11-22 NOTE — Discharge Instructions (Signed)
Braxton Hicks Contractions °Contractions of the uterus can occur throughout pregnancy, but they are not always a sign that you are in labor. You may have practice contractions called Braxton Hicks contractions. These false labor contractions are sometimes confused with true labor. °What are Braxton Hicks contractions? °Braxton Hicks contractions are tightening movements that occur in the muscles of the uterus before labor. Unlike true labor contractions, these contractions do not result in opening (dilation) and thinning of the cervix. Toward the end of pregnancy (32-34 weeks), Braxton Hicks contractions can happen more often and may become stronger. These contractions are sometimes difficult to tell apart from true labor because they can be very uncomfortable. You should not feel embarrassed if you go to the hospital with false labor. °Sometimes, the only way to tell if you are in true labor is for your health care provider to look for changes in the cervix. The health care provider will do a physical exam and may monitor your contractions. If you are not in true labor, the exam should show that your cervix is not dilating and your water has not broken. °If there are other health problems associated with your pregnancy, it is completely safe for you to be sent home with false labor. You may continue to have Braxton Hicks contractions until you go into true labor. °How to tell the difference between true labor and false labor °True labor °· Contractions last 30-70 seconds. °· Contractions become very regular. °· Discomfort is usually felt in the top of the uterus, and it spreads to the lower abdomen and low back. °· Contractions do not go away with walking. °· Contractions usually become more intense and increase in frequency. °· The cervix dilates and gets thinner. °False labor °· Contractions are usually shorter and not as strong as true labor contractions. °· Contractions are usually irregular. °· Contractions  are often felt in the front of the lower abdomen and in the groin. °· Contractions may go away when you walk around or change positions while lying down. °· Contractions get weaker and are shorter-lasting as time goes on. °· The cervix usually does not dilate or become thin. °Follow these instructions at home: °· Take over-the-counter and prescription medicines only as told by your health care provider. °· Keep up with your usual exercises and follow other instructions from your health care provider. °· Eat and drink lightly if you think you are going into labor. °· If Braxton Hicks contractions are making you uncomfortable: °? Change your position from lying down or resting to walking, or change from walking to resting. °? Sit and rest in a tub of warm water. °? Drink enough fluid to keep your urine pale yellow. Dehydration may cause these contractions. °? Do slow and deep breathing several times an hour. °· Keep all follow-up prenatal visits as told by your health care provider. This is important. °Contact a health care provider if: °· You have a fever. °· You have continuous pain in your abdomen. °Get help right away if: °· Your contractions become stronger, more regular, and closer together. °· You have fluid leaking or gushing from your vagina. °· You pass blood-tinged mucus (bloody show). °· You have bleeding from your vagina. °· You have low back pain that you never had before. °· You feel your baby’s head pushing down and causing pelvic pressure. °· Your baby is not moving inside you as much as it used to. °Summary °· Contractions that occur before labor are called Braxton   Hicks contractions, false labor, or practice contractions. °· Braxton Hicks contractions are usually shorter, weaker, farther apart, and less regular than true labor contractions. True labor contractions usually become progressively stronger and regular and they become more frequent. °· Manage discomfort from Braxton Hicks contractions by  changing position, resting in a warm bath, drinking plenty of water, or practicing deep breathing. °This information is not intended to replace advice given to you by your health care provider. Make sure you discuss any questions you have with your health care provider. °Document Released: 01/05/2017 Document Revised: 01/05/2017 Document Reviewed: 01/05/2017 °Elsevier Interactive Patient Education © 2018 [employer] ° °

## 2017-11-22 NOTE — Progress Notes (Signed)
   PRENATAL VISIT NOTE  Subjective:  Julia Morgan is a 27 y.o. G3P2002 at 10265w2d being seen today for ongoing prenatal care.  She is currently monitored for the following issues for this low-risk pregnancy and has Supervision of normal intrauterine pregnancy in multigravida in second trimester; Limited prenatal care, antepartum; Iron deficiency anemia of pregnancy; and Depression affecting pregnancy, antepartum on their problem list.  Patient reports no complaints.  Contractions: Irritability. Vag. Bleeding: Small.  Movement: Present. Denies leaking of fluid.   The following portions of the patient's history were reviewed and updated as appropriate: allergies, current medications, past family history, past medical history, past social history, past surgical history and problem list. Problem list updated.  Objective:   Vitals:   11/22/17 0859  BP: 109/69  Pulse: 96  Weight: 144 lb (65.3 kg)    Fetal Status:     Movement: Present     General:  Alert, oriented and cooperative. Patient is in no acute distress.  Skin: Skin is warm and dry. No rash noted.   Cardiovascular: Normal heart rate noted  Respiratory: Normal respiratory effort, no problems with respiration noted  Abdomen: Soft, gravid, appropriate for gestational age.  Pain/Pressure: Present     Pelvic: Cervical exam performed        Extremities: Normal range of motion.  Edema: None  Mental Status:  Normal mood and affect. Normal behavior. Normal judgment and thought content.   Assessment and Plan:  Pregnancy: G3P2002 at 3165w2d  1. Supervision of normal intrauterine pregnancy in multigravida in second trimester To L&D for delivery  Term labor symptoms and general obstetric precautions including but not limited to vaginal bleeding, contractions, leaking of fluid and fetal movement were reviewed in detail with the patient. Please refer to After Visit Summary for other counseling recommendations.  No Follow-up on  file.   Allie BossierMyra C Khalila Buechner, MD

## 2017-11-22 NOTE — Progress Notes (Signed)
1215 Pt called rn into room.  Stated she had a clot when up to bathroom. RN noted a small dark red clot on tissue about the size of a dime.  Pt not having increase in pain.  No bright red bleeding running down legs. RN reassured pt this is not uncommon after cervical exams and pt had reported bleeding after membranes stripped in the office.   Instance reported to CNM.

## 2017-11-22 NOTE — Discharge Summary (Signed)
OB Discharge Summary     Patient Name: Julia Morgan DOB: [birth year] MRN: [id number]  Date of admission: 11/22/2017 Delivering MD:     Date of discharge: 11/22/2017  Admitting diagnosis: labor Intrauterine pregnancy: [redacted]w[redacted]d     Secondary diagnosis:  Active Problems:   False labor  Additional problems: none     Discharge diagnosis: false labor                                                                                              Hospital course:  Patient directly admitted from clinic for labor. On exam patient 4.5/60/ballotable. Monitored for 5 hours. Category 1 tracing, repeat cervical exam unchanged. Contracting every 5-10 minutes, not painful. No vaginal bleeding or rom. Discussed dangers to newborn with iol prior to 39 weeks preclude induction at this time. Given favorable cervix, IOL scheduled for 11/27/17 when pt will be 39+0.   Physical exam  Vitals:   11/22/17 1122 11/22/17 1126 11/22/17 1307 11/22/17 1432  BP:  110/65  (!) 110/56  Pulse:  67  73  Resp:  18 18 16   Temp:  97.7 F (36.5 C)    TempSrc:  Oral    Weight: 144 lb (65.3 kg)     Height: 5\' 5"  (1.651 m)      General: alert, cooperative and no distress Abdomen: gravid, non-tender Sve: 4.5/60/ballotable Labs: Lab Results  Component Value Date   WBC 5.6 11/22/2017   HGB 11.0 (L) 11/22/2017   HCT 31.2 (L) 11/22/2017   MCV 78.0 11/22/2017   PLT 176 11/22/2017   CMP Latest Ref Rng & Units 11/03/2017  Glucose 65 - 99 mg/dL 72  BUN 6 - 20 mg/dL 6  Creatinine 2.53 - 6.64 mg/dL 4.03(K)  Sodium 742 - 595 mmol/L 134(L)  Potassium 3.5 - 5.1 mmol/L 3.5  Chloride 101 - 111 mmol/L 108  CO2 22 - 32 mmol/L 20(L)  Calcium 8.9 - 10.3 mg/dL 8.1(L)  Total Protein 6.5 - 8.1 g/dL 6.3(O)  Total Bilirubin 0.3 - 1.2 mg/dL 0.7  Alkaline Phos 38 - 126 U/L 98  AST 15 - 41 U/L 17  ALT 14 - 54 U/L 9(L)    Discharge instruction: per After Visit Summary and "Baby and Me Booklet".  After visit meds:  Allergies as of  11/22/2017   No Known Allergies     Medication List    TAKE these medications   albuterol 108 (90 Base) MCG/ACT inhaler Commonly known as:  PROVENTIL HFA;VENTOLIN HFA Inhale 1-2 puffs into the lungs every 6 (six) hours as needed for wheezing or shortness of breath.   diphenhydramine-acetaminophen 25-500 MG Tabs tablet Commonly known as:  TYLENOL PM Take 1 tablet by mouth at bedtime as needed (sleep).   Iron 325 (65 Fe) MG Tabs Take 1 tablet (325 mg total) by mouth 2 (two) times daily. Take with juice; avoid dairy 30 min before and after taking med.   PREPLUS 27-1 MG Tabs Take 1 tablet by mouth daily.   sertraline 50 MG tablet Commonly known as:  ZOLOFT Take 1 tablet (50 mg total) by mouth daily.  Diet: routine diet  Activity: Advance as tolerated. Pelvic rest for 6 weeks.   Outpatient follow up: postpartum Follow up Appt: Future Appointments  Date Time Provider Department Center  11/27/2017  7:00 AM [facility] ROOM [facility] None   Follow up Visit:iol 11/27/17     11/22/2017 Silvano BilisNoah B Ahlani Wickes, MD

## 2017-11-22 NOTE — Anesthesia Pain Management Evaluation Note (Signed)
  CRNA Pain Management Visit Note  Patient: Julia Morgan, 27 y.o., female  "Hello I am a [facility] the [facility]. We have an anesthesia team available at all times to provide care throughout the hospital, including epidural management and anesthesia for C-section. I don't know your plan for the delivery whether it a natural birth, water birth, IV sedation, nitrous supplementation, doula or epidural, but we want to meet your pain goals."   1.Was your pain managed to your expectations on prior hospitalizations?   Yes   2.What is your expectation for pain management during this hospitalization?     Epidural  3.How can we help you reach that goal?   Record the patient's initial score and the patient's pain goal.   Pain: 3  Pain Goal: 8 [facility] wants you to be able to say your pain was always managed very well.  Laban EmperorMalinova,Julia Morgan 11/22/2017

## 2017-11-23 ENCOUNTER — Encounter (HOSPITAL_COMMUNITY): Payer: Self-pay | Admitting: *Deleted

## 2017-11-23 ENCOUNTER — Telehealth (HOSPITAL_COMMUNITY): Payer: Self-pay | Admitting: *Deleted

## 2017-11-23 LAB — RPR: RPR: NONREACTIVE

## 2017-11-23 NOTE — Telephone Encounter (Signed)
Preadmission screen  

## 2017-11-27 ENCOUNTER — Inpatient Hospital Stay (HOSPITAL_COMMUNITY)
Admit: 2017-11-27 | Discharge: 2017-11-27 | Disposition: A | Discharge status: Home / Self Care | Payer: Medicaid Other | Attending: Obstetrics and Gynecology | Admitting: Obstetrics and Gynecology

## 2017-12-19 ENCOUNTER — Encounter: Payer: Medicaid Other | Admitting: Obstetrics & Gynecology

## 2018-01-05 ENCOUNTER — Ambulatory Visit (INDEPENDENT_AMBULATORY_CARE_PROVIDER_SITE_OTHER): Payer: Medicaid Other | Admitting: Advanced Practice Midwife

## 2018-01-05 ENCOUNTER — Other Ambulatory Visit (HOSPITAL_COMMUNITY)
Admission: RE | Admit: 2018-01-05 | Discharge: 2018-01-05 | Disposition: A | Discharge status: Home / Self Care | Payer: Medicaid Other | Source: Ambulatory Visit | Attending: Advanced Practice Midwife | Admitting: Advanced Practice Midwife

## 2018-01-05 ENCOUNTER — Encounter: Payer: Self-pay | Admitting: Advanced Practice Midwife

## 2018-01-05 VITALS — BP 132/83 | HR 74 | Resp 16 | Ht 65.0 in | Wt 128.0 lb

## 2018-01-05 DIAGNOSIS — IMO0001 Reserved for inherently not codable concepts without codable children: Secondary | ICD-10-CM

## 2018-01-05 DIAGNOSIS — Z1389 Encounter for screening for other disorder: Secondary | ICD-10-CM | POA: Diagnosis not present

## 2018-01-05 DIAGNOSIS — Z789 Other specified health status: Secondary | ICD-10-CM | POA: Insufficient documentation

## 2018-01-05 DIAGNOSIS — Z30430 Encounter for insertion of intrauterine contraceptive device: Secondary | ICD-10-CM | POA: Diagnosis not present

## 2018-01-05 DIAGNOSIS — O9279 Other disorders of lactation: Secondary | ICD-10-CM | POA: Diagnosis not present

## 2018-01-05 DIAGNOSIS — Z975 Presence of (intrauterine) contraceptive device: Secondary | ICD-10-CM

## 2018-01-05 DIAGNOSIS — Z3202 Encounter for pregnancy test, result negative: Secondary | ICD-10-CM | POA: Diagnosis not present

## 2018-01-05 LAB — POCT URINE PREGNANCY: Preg Test, Ur: NEGATIVE

## 2018-01-05 MED ORDER — IBUPROFEN 600 MG PO TABS: 600.0000 mg | ORAL_TABLET | Freq: Four times a day (QID) | ORAL | 1 refills | Status: DC | PRN

## 2018-01-05 MED ORDER — PARAGARD INTRAUTERINE COPPER IU IUD
INTRAUTERINE_SYSTEM | Freq: Once | INTRAUTERINE | Status: AC
Start: 2018-01-05 — End: 2018-01-05
  Administered 2018-01-05: 12:00:00 via INTRAUTERINE

## 2018-01-05 MED ORDER — PARAGARD INTRAUTERINE COPPER IU IUD: INTRAUTERINE_SYSTEM | Freq: Once | INTRAUTERINE | Status: AC

## 2018-01-05 NOTE — Progress Notes (Signed)
Post Partum Exam  Julia Morgan is a 27 y.o. [id number] female who presents for a postpartum visit. She is 6 weeks postpartum following a spontaneous vaginal delivery @ [facility]. I have fully reviewed the prenatal and intrapartum course. The delivery was at [redacted]w[redacted]d gestational weeks.  Anesthesia: none. Postpartum course has been unremarkable. Baby's course has been . Baby is feeding by breast. Bleeding no bleeding. Bowel function is normal. Bladder function is normal. Patient is not sexually active. Contraception method is requests Paragard IUD. Postpartum depression screening:neg  The following portions of the patient's history were reviewed and updated as appropriate: allergies, current medications, past family history, past medical history, past social history, past surgical history and problem list. Last pap smear done 2016 and was Normal  Review of Systems Pertinent items are noted in HPI.   Painful breast lump on outer right breast since yesterday after prolonged interval btw breastfeeding. Denies fever, chills, erythema. Warmth.   Objective:  Blood pressure 132/83, pulse 74, resp. rate 16, height  (1.651 m), weight 128 lb (58.1 kg), last menstrual period 02/27/2017, unknown if currently breastfeeding.  General:  alert, cooperative, appears stated age and no distress   Breasts:  inspection negative, no nipple discharge or bleeding, 2 cm firm tender mass at 7 o'clock near border of areola of right breast. No erythema, warmth, swelling, fluctuance.   Lungs: regular rate. No respiratory distress  Heart:  regular rate and rhythm  Abdomen: soft, non-tender; bowel sounds normal; no masses,  no organomegaly   Vulva:  normal  Vagina: normal vagina, no discharge, exudate, lesion, or erythema  Cervix:  multiparous appearance, no bleeding following Pap and no cervical motion tenderness  Corpus: normal size, contour, position, consistency, mobility, non-tender  Adnexa:  no mass,  fullness, tenderness  Rectal Exam: Not performed.        IUD Insertion Procedure Patient identified, informed consent performed, signed copy in chart, time out was performed.  Urine pregnancy test negative.  Speculum placed in the vagina.  Cervix visualized.  Cleaned with Betadine x 2.  Grasped anteriourly/posteriorly with a single tooth tenaculum.  Uterus sounded to 8 cm. Paragard IUD placed per manufacturer's recommendations.  Strings trimmed to 3 cm. Pt tolerated procedure well. Tenaculum sites made hemostatic w/ silver nitrate.   Patient given post procedure instructions and IUD care card with expiration date.  Patient is asked to check IUD strings periodically and follow up in 4-6 weeks for IUD check  Assessment:    Postpartum exam. Pap smear done at today's visit.   1. Birth control - Paragard IUD - POCT urine pregnancy  2. Encounter for IUD insertion - Paragard IUD - ibuprofen (ADVIL,MOTRIN) 600 MG tablet; Take 1 tablet (600 mg total) by mouth every 6 (six) hours as needed.  Dispense: 30 tablet; Refill: 1  3. Clogged duct, postpartum w/out evidence of mastitis.  - Feed on schedule every three hours. Massage area w. Clogged duct. May help to applt heat for 10 minutes prior to feeding.  - ibuprofen (ADVIL,MOTRIN) 600 MG tablet; Take 1 tablet (600 mg total) by mouth every 6 (six) hours as needed.  Dispense: 30 tablet; Refill: 1 - Push fluids.  - Mastitis precautions.    Plan:   1. Contraception: IUD 2. See above 3. Follow up in: 6 weeks or as needed.

## 2018-01-05 NOTE — Patient Instructions (Signed)

## 2018-01-07 DIAGNOSIS — Z975 Presence of (intrauterine) contraceptive device: Secondary | ICD-10-CM | POA: Insufficient documentation

## 2018-01-09 LAB — CYTOLOGY - PAP: Diagnosis: NEGATIVE

## 2018-07-02 ENCOUNTER — Encounter (HOSPITAL_BASED_OUTPATIENT_CLINIC_OR_DEPARTMENT_OTHER): Payer: Self-pay

## 2018-07-02 ENCOUNTER — Emergency Department (HOSPITAL_BASED_OUTPATIENT_CLINIC_OR_DEPARTMENT_OTHER)
Admission: EM | Admit: 2018-07-02 | Discharge: 2018-07-02 | Disposition: A | Discharge status: Home / Self Care | Payer: Medicaid Other | Attending: Emergency Medicine | Admitting: Emergency Medicine

## 2018-07-02 ENCOUNTER — Other Ambulatory Visit: Payer: Self-pay

## 2018-07-02 DIAGNOSIS — Z87891 Personal history of nicotine dependence: Secondary | ICD-10-CM | POA: Insufficient documentation

## 2018-07-02 DIAGNOSIS — Z79899 Other long term (current) drug therapy: Secondary | ICD-10-CM | POA: Insufficient documentation

## 2018-07-02 DIAGNOSIS — R197 Diarrhea, unspecified: Secondary | ICD-10-CM | POA: Insufficient documentation

## 2018-07-02 DIAGNOSIS — R5383 Other fatigue: Secondary | ICD-10-CM | POA: Insufficient documentation

## 2018-07-02 DIAGNOSIS — M791 Myalgia, unspecified site: Secondary | ICD-10-CM | POA: Insufficient documentation

## 2018-07-02 DIAGNOSIS — R112 Nausea with vomiting, unspecified: Secondary | ICD-10-CM

## 2018-07-02 DIAGNOSIS — J45909 Unspecified asthma, uncomplicated: Secondary | ICD-10-CM | POA: Insufficient documentation

## 2018-07-02 LAB — CBC WITH DIFFERENTIAL/PLATELET
Abs Immature Granulocytes: 0.01 10*3/uL (ref 0.00–0.07)
BASOS ABS: 0 10*3/uL (ref 0.0–0.1)
Basophils Relative: 0 %
Eosinophils Absolute: 0.1 10*3/uL (ref 0.0–0.5)
Eosinophils Relative: 1 %
HEMATOCRIT: 42.5 % (ref 36.0–46.0)
Hemoglobin: 14.4 g/dL (ref 12.0–15.0)
Immature Granulocytes: 0 %
LYMPHS ABS: 0.5 10*3/uL — AB (ref 0.7–4.0)
Lymphocytes Relative: 7 %
MCH: 26.1 pg (ref 26.0–34.0)
MCHC: 33.9 g/dL (ref 30.0–36.0)
MCV: 77 fL — ABNORMAL LOW (ref 80.0–100.0)
Monocytes Absolute: 0.2 10*3/uL (ref 0.1–1.0)
Monocytes Relative: 3 %
NRBC: 0 % (ref 0.0–0.2)
Neutro Abs: 6.2 10*3/uL (ref 1.7–7.7)
Neutrophils Relative %: 89 %
Platelets: 286 10*3/uL (ref 150–400)
RBC: 5.52 MIL/uL — ABNORMAL HIGH (ref 3.87–5.11)
RDW: 12.9 % (ref 11.5–15.5)
WBC: 7 10*3/uL (ref 4.0–10.5)

## 2018-07-02 LAB — URINALYSIS, MICROSCOPIC (REFLEX)

## 2018-07-02 LAB — URINALYSIS, ROUTINE W REFLEX MICROSCOPIC
GLUCOSE, UA: NEGATIVE mg/dL
Hgb urine dipstick: NEGATIVE
NITRITE: NEGATIVE
PH: 5.5 (ref 5.0–8.0)
Protein, ur: NEGATIVE mg/dL
Specific Gravity, Urine: >1.03 — ABNORMAL HIGH (ref 1.005–1.030)

## 2018-07-02 LAB — COMPREHENSIVE METABOLIC PANEL
ALBUMIN: 4.7 g/dL (ref 3.5–5.0)
ALT: 13 U/L (ref 0–44)
AST: 22 U/L (ref 15–41)
Alkaline Phosphatase: 68 U/L (ref 38–126)
Anion gap: 12 (ref 5–15)
BUN: 16 mg/dL (ref 6–20)
CO2: 23 mmol/L (ref 22–32)
CREATININE: 0.65 mg/dL (ref 0.44–1.00)
Calcium: 9.4 mg/dL (ref 8.9–10.3)
Chloride: 103 mmol/L (ref 98–111)
GFR calc Af Amer: >60 mL/min (ref >60)
Glucose, Bld: 101 mg/dL — ABNORMAL HIGH (ref 70–99)
POTASSIUM: 3.8 mmol/L (ref 3.5–5.1)
Sodium: 138 mmol/L (ref 135–145)
Total Bilirubin: 0.7 mg/dL (ref 0.3–1.2)
Total Protein: 8.2 g/dL — ABNORMAL HIGH (ref 6.5–8.1)

## 2018-07-02 LAB — RAPID URINE DRUG SCREEN, HOSP PERFORMED
Amphetamines: NOT DETECTED
BARBITURATES: NOT DETECTED
Benzodiazepines: NOT DETECTED
Cocaine: NOT DETECTED
Opiates: NOT DETECTED
TETRAHYDROCANNABINOL: POSITIVE — AB

## 2018-07-02 LAB — LIPASE, BLOOD: Lipase: 25 U/L (ref 11–51)

## 2018-07-02 LAB — PREGNANCY, URINE: Preg Test, Ur: NEGATIVE

## 2018-07-02 MED ORDER — ONDANSETRON 4 MG PO TBDP
4.0000 mg | ORAL_TABLET | Freq: Once | ORAL | Status: AC
  Administered 2018-07-02: 4 mg via ORAL
  Filled 2018-07-02: qty 1

## 2018-07-02 MED ORDER — ONDANSETRON HCL 4 MG PO TABS: 4.0000 mg | ORAL_TABLET | Freq: Three times a day (TID) | ORAL | 0 refills | Status: DC | PRN

## 2018-07-02 NOTE — ED Triage Notes (Signed)
C/o n/v/d, chills-started last night-NAD-steady gait

## 2018-07-02 NOTE — Discharge Instructions (Addendum)
Contact a health care provider if: °· You cannot keep fluids down. °· Your symptoms get worse. °· You have new symptoms. °· You feel light-headed or dizzy. °· You have muscle cramps. °Get help right away if: °· You have chest pain. °· You feel extremely weak or you faint. °· You see blood in your vomit. °· Your vomit looks like coffee grounds. °· You have bloody or black stools or stools that look like tar. °· You have a severe headache, a stiff neck, or both. °· You have a rash. °· You have severe pain, cramping, or bloating in your abdomen. °· You have trouble breathing or you are breathing very quickly. °· Your heart is beating very quickly. °· Your skin feels cold and clammy. °· You feel confused. °· You have pain when you urinate. °· You have signs of dehydration, such as: °? Dark urine, very little urine, or no urine. °? Cracked lips. °? Dry mouth. °? Sunken eyes. °? Sleepiness. °? Weakness. ° °

## 2018-07-02 NOTE — ED Notes (Signed)
Pt states she know there is a wait and she will will probably leave-asked pt to let staff know if/when she leaves-pt NAD-steady gait

## 2018-07-02 NOTE — ED Provider Notes (Signed)
[facility] EMERGENCY DEPARTMENT Provider Note   CSN: [id number] Arrival date & time: 07/02/18  1122     History   Chief Complaint Chief Complaint  Patient presents with  . Emesis    HPI   Julia Morgan is a 27 y.o. female with complaint of gastrointestinal symptoms of watery diarrhea, nausea, vomiting for 1 days. No blood in stool. No abdominal pain, no urinary or vaginal symptoms.  She has associated myalgias and fatigue.  She denies any recent foreign travel, ingestion of suspicious foods, or contacts with similar symptoms.  Marland Kitchen   HPI  Past Medical History:  Diagnosis Date  . Anemia   . Asthma   . Back pain   . Depression    not post partum but taking zoloft currently  . 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 Mental disorder   . Migraines   . PONV (postoperative nausea and vomiting)     Patient Active Problem List   Diagnosis Date Noted  . IUD (intrauterine device) in place 01/07/2018  . Depression affecting pregnancy, antepartum 11/03/2017    Past Surgical History:  Procedure Laterality Date  . NO PAST SURGERIES       OB History    Gravida  3   Para  3   Term  3   Preterm      AB      Living  2     SAB      TAB      Ectopic      Multiple  0   Live Births  2            Home Medications    Prior to Admission medications   Medication Sig Start Date End Date Taking? Authorizing Provider  albuterol (PROVENTIL HFA;VENTOLIN HFA) 108 (90 Base) MCG/ACT inhaler Inhale 1-2 puffs into the lungs every 6 (six) hours as needed for wheezing or shortness of breath. 10/24/16   Lurene Shadow, PA-C  diphenhydramine-acetaminophen (TYLENOL PM) 25-500 MG TABS tablet Take 1 tablet by mouth at bedtime as needed (sleep).    [provider]  ibuprofen (ADVIL,MOTRIN) 600 MG tablet Take 1 tablet (600 mg total) by mouth every 6 (six) hours as needed. 01/05/18   Katrinka Blazing, [place], CNM  ondansetron (ZOFRAN) 4 MG tablet Take 1 tablet (4 mg  total) by mouth every 8 (eight) hours as needed for nausea or vomiting. 07/02/18   Arthor Captain, PA-C  Prenatal Vit-Fe Fumarate-FA (PREPLUS) 27-1 MG TABS Take 1 tablet by mouth daily. 10/16/17   Sharyon Cable, CNM  sertraline (ZOLOFT) 50 MG tablet Take 1 tablet (50 mg total) by mouth daily. Patient not taking: Reported on 01/05/2018 11/15/17   Constant, Peggy, MD    Family History Family History  Problem Relation Age of Onset  . Hyperlipidemia Mother   . Hypertension Mother   . Hyperlipidemia Maternal Grandmother   . Hypertension Maternal Grandmother   . Cancer - Cervical Paternal Grandmother     Social History Social History   Tobacco Use  . Smoking status: Former Games developer  . Smokeless tobacco: Never Used  Substance Use Topics  . Alcohol use: No    Alcohol/week: 0.0 standard drinks  . Drug use: Not Currently    Types: Marijuana     Allergies   Patient has no known allergies.   Review of Systems Review of Systems Ten systems reviewed and are negative for acute change, except as noted in the HPI.  Physical Exam Updated Vital Signs BP 117/85 (BP Location: Right Arm)   Pulse 79   Temp 98.1 F (36.7 C) (Oral)   Resp 16   Ht 5\' 5"  (1.651 m)   Wt 59.4 kg   SpO2 100%   BMI 21.80 kg/m   Physical Exam Physical Exam  Nursing note and vitals reviewed. Constitutional: She is oriented to person, place, and time. She appears well-developed and well-nourished. No distress.  HENT:  Head: Normocephalic and atraumatic.  Eyes: Conjunctivae normal and EOM are normal. Pupils are equal, round, and reactive to light. No scleral icterus.  Neck: Normal range of motion.  Cardiovascular: Normal rate, regular rhythm and normal heart sounds.  Exam reveals no gallop and no friction rub.   No murmur heard. Pulmonary/Chest: Effort normal and breath sounds normal. No respiratory distress.  Abdominal: Soft. Bowel sounds are normal. She exhibits no distension and no mass. There is no  tenderness. There is no guarding.  Neurological: She is alert and oriented to person, place, and time.  Skin: Skin is warm and dry. She is not diaphoretic.     ED Treatments / Results  Labs (all labs ordered are listed, but only abnormal results are displayed) Labs Reviewed  COMPREHENSIVE METABOLIC PANEL - Abnormal; Notable for the following components:      Result Value   Glucose, Bld 101 (*)    Total Protein 8.2 (*)    All other components within normal limits  CBC WITH DIFFERENTIAL/PLATELET - Abnormal; Notable for the following components:   RBC 5.52 (*)    MCV 77.0 (*)    Lymphs Abs 0.5 (*)    All other components within normal limits  URINALYSIS, ROUTINE W REFLEX MICROSCOPIC - Abnormal; Notable for the following components:   APPearance HAZY (*)    Specific Gravity, Urine >1.030 (*)    Bilirubin Urine SMALL (*)    Ketones, ur >80 (*)    Leukocytes, UA TRACE (*)    All other components within normal limits  RAPID URINE DRUG SCREEN, HOSP PERFORMED - Abnormal; Notable for the following components:   Tetrahydrocannabinol POSITIVE (*)    All other components within normal limits  URINALYSIS, MICROSCOPIC (REFLEX) - Abnormal; Notable for the following components:   Bacteria, UA MANY (*)    All other components within normal limits  PREGNANCY, URINE  LIPASE, BLOOD    EKG None  Radiology No results found.  Procedures Procedures (including critical care time)  Medications Ordered in ED Medications  ondansetron (ZOFRAN-ODT) disintegrating tablet 4 mg (4 mg Oral Given 07/02/18 1400)     Initial Impression / Assessment and Plan / ED Course  I have reviewed the triage vital signs and the nursing notes.  Pertinent labs & imaging results that were available during my care of the patient were reviewed by me and considered in my medical decision making (see chart for details).    Patient urine shows ketones likely secondary to her vomiting.  She has bacteria in her urine  without nitrites or white cells suggestive of infection this likely represents contamination.  Her urine drug screen is positive for THC however she does not fit the clinical presentation of cannabis hyperemesis as she is not having any active vomiting at this time.  Patient with symptoms consistent with viral gastroenteritis.  Vitals are stable, no fever.  No signs of dehydration, tolerating PO fluids > 6 oz.  Lungs are clear.  No focal abdominal pain, no concern for appendicitis, cholecystitis, pancreatitis, ruptured  viscus, UTI, kidney stone, or any other abdominal etiology.  Supportive therapy indicated with return if symptoms worsen.  Patient counseled.   Final Clinical Impressions(s) / ED Diagnoses   Final diagnoses:  Nausea vomiting and diarrhea    ED Discharge Orders         Ordered    ondansetron (ZOFRAN) 4 MG tablet  Every 8 hours PRN     07/02/18 1511           Arthor Captain, PA-C 07/02/18 1737    Tegeler, Canary Brim, MD 07/02/18 1842

## 2018-07-02 NOTE — ED Notes (Signed)
Attempted IV stick x 2- unsuccessful. 2nd RN to bedside.

## 2020-08-18 ENCOUNTER — Ambulatory Visit: Payer: Self-pay | Admitting: Certified Nurse Midwife

## 2020-11-13 ENCOUNTER — Encounter (HOSPITAL_BASED_OUTPATIENT_CLINIC_OR_DEPARTMENT_OTHER): Payer: Self-pay

## 2020-11-13 ENCOUNTER — Other Ambulatory Visit: Payer: Self-pay

## 2020-11-13 ENCOUNTER — Emergency Department (HOSPITAL_BASED_OUTPATIENT_CLINIC_OR_DEPARTMENT_OTHER)
Admission: EM | Admit: 2020-11-13 | Discharge: 2020-11-13 | Disposition: A | Discharge status: Home / Self Care | Payer: Self-pay | Attending: Emergency Medicine | Admitting: Emergency Medicine

## 2020-11-13 DIAGNOSIS — Z87891 Personal history of nicotine dependence: Secondary | ICD-10-CM | POA: Insufficient documentation

## 2020-11-13 DIAGNOSIS — J45909 Unspecified asthma, uncomplicated: Secondary | ICD-10-CM | POA: Insufficient documentation

## 2020-11-13 DIAGNOSIS — G43809 Other migraine, not intractable, without status migrainosus: Secondary | ICD-10-CM

## 2020-11-13 DIAGNOSIS — G43909 Migraine, unspecified, not intractable, without status migrainosus: Secondary | ICD-10-CM | POA: Insufficient documentation

## 2020-11-13 DIAGNOSIS — H5371 Glare sensitivity: Secondary | ICD-10-CM | POA: Insufficient documentation

## 2020-11-13 LAB — CBC WITH DIFFERENTIAL/PLATELET
Abs Immature Granulocytes: 0.01 10*3/uL (ref 0.00–0.07)
Basophils Absolute: 0.1 10*3/uL (ref 0.0–0.1)
Basophils Relative: 1 %
Eosinophils Absolute: 0.2 10*3/uL (ref 0.0–0.5)
Eosinophils Relative: 4 %
HCT: 37.3 % (ref 36.0–46.0)
Hemoglobin: 13 g/dL (ref 12.0–15.0)
Immature Granulocytes: 0 %
Lymphocytes Relative: 30 %
Lymphs Abs: 1.4 10*3/uL (ref 0.7–4.0)
MCH: 27.4 pg (ref 26.0–34.0)
MCHC: 34.9 g/dL (ref 30.0–36.0)
MCV: 78.5 fL — ABNORMAL LOW (ref 80.0–100.0)
Monocytes Absolute: 0.4 10*3/uL (ref 0.1–1.0)
Monocytes Relative: 9 %
Neutro Abs: 2.6 10*3/uL (ref 1.7–7.7)
Neutrophils Relative %: 56 %
Platelets: 315 10*3/uL (ref 150–400)
RBC: 4.75 MIL/uL (ref 3.87–5.11)
RDW: 13.3 % (ref 11.5–15.5)
WBC: 4.5 10*3/uL (ref 4.0–10.5)
nRBC: 0 % (ref 0.0–0.2)

## 2020-11-13 LAB — BASIC METABOLIC PANEL
Anion gap: 9 (ref 5–15)
BUN: 11 mg/dL (ref 6–20)
CO2: 25 mmol/L (ref 22–32)
Calcium: 8.8 mg/dL — ABNORMAL LOW (ref 8.9–10.3)
Chloride: 104 mmol/L (ref 98–111)
Creatinine, Ser: 0.69 mg/dL (ref 0.44–1.00)
GFR, Estimated: >60 mL/min (ref >60)
Glucose, Bld: 97 mg/dL (ref 70–99)
Potassium: 3.4 mmol/L — ABNORMAL LOW (ref 3.5–5.1)
Sodium: 138 mmol/L (ref 135–145)

## 2020-11-13 LAB — PREGNANCY, URINE: Preg Test, Ur: NEGATIVE

## 2020-11-13 MED ORDER — DIPHENHYDRAMINE HCL 50 MG/ML IJ SOLN
12.5000 mg | Freq: Once | INTRAMUSCULAR | Status: AC
  Administered 2020-11-13: 12.5 mg via INTRAVENOUS
  Filled 2020-11-13: qty 1

## 2020-11-13 MED ORDER — METOCLOPRAMIDE HCL 5 MG/ML IJ SOLN
5.0000 mg | Freq: Once | INTRAMUSCULAR | Status: AC
  Administered 2020-11-13: 5 mg via INTRAVENOUS
  Filled 2020-11-13: qty 2

## 2020-11-13 MED ORDER — SODIUM CHLORIDE 0.9 % IV BOLUS
1000.0000 mL | Freq: Once | INTRAVENOUS | Status: AC
  Administered 2020-11-13: 1000 mL via INTRAVENOUS

## 2020-11-13 MED ORDER — KETOROLAC TROMETHAMINE 30 MG/ML IJ SOLN
30.0000 mg | Freq: Once | INTRAMUSCULAR | Status: AC
  Administered 2020-11-13: 30 mg via INTRAVENOUS
  Filled 2020-11-13: qty 1

## 2020-11-13 MED ORDER — DEXAMETHASONE SODIUM PHOSPHATE 10 MG/ML IJ SOLN
10.0000 mg | Freq: Once | INTRAMUSCULAR | Status: AC
  Administered 2020-11-13: 10 mg via INTRAVENOUS
  Filled 2020-11-13: qty 1

## 2020-11-13 NOTE — ED Provider Notes (Signed)
[facility] EMERGENCY DEPARTMENT Provider Note   CSN: [id number] Arrival date & time: 11/13/20  4332     History Chief Complaint  Patient presents with  . Migraine    Julia Morgan is a 30 y.o. female.  HPI   30 year old female with past medical history of migraines presents the emergency department with ongoing migraine.  Patient states that she frequently gets migraines about once or twice a week.  Typically she is able to take Excedrin with good relief.  However over the last day she is taking Excedrin multiple times and has not been able to resolve this current migraine.  She states this migraine feels like her previous migraines just more severe.  She has associated nausea/vomiting and photophobia.  Denies any fever, neck pain/stiffness.  No other recent illness or acute change in her health.  Does not believe that she is pregnant.  Past Medical History:  Diagnosis Date  . Anemia   . Asthma   . Back pain   . Depression    not post partum but taking zoloft currently  . 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 Mental disorder   . Migraines   . PONV (postoperative nausea and vomiting)     Patient Active Problem List   Diagnosis Date Noted  . IUD (intrauterine device) in place 01/07/2018  . Depression affecting pregnancy, antepartum 11/03/2017    Past Surgical History:  Procedure Laterality Date  . NO PAST SURGERIES       OB History    Gravida  3   Para  3   Term  3   Preterm      AB      Living  2     SAB      IAB      Ectopic      Multiple  0   Live Births  2           Family History  Problem Relation Age of Onset  . Hyperlipidemia Mother   . Hypertension Mother   . Hyperlipidemia Maternal Grandmother   . Hypertension Maternal Grandmother   . Cancer - Cervical Paternal Grandmother     Social History   Tobacco Use  . Smoking status: Former Games developer  . Smokeless tobacco: Never Used  Substance Use Topics  . Alcohol  use: No    Alcohol/week: 0.0 standard drinks  . Drug use: Not Currently    Types: Marijuana    Home Medications Prior to Admission medications   Medication Sig Start Date End Date Taking? Authorizing Provider  albuterol (PROVENTIL HFA;VENTOLIN HFA) 108 (90 Base) MCG/ACT inhaler Inhale 1-2 puffs into the lungs every 6 (six) hours as needed for wheezing or shortness of breath. 10/24/16   Lurene Shadow, PA-C  diphenhydramine-acetaminophen (TYLENOL PM) 25-500 MG TABS tablet Take 1 tablet by mouth at bedtime as needed (sleep).    [provider]  ibuprofen (ADVIL,MOTRIN) 600 MG tablet Take 1 tablet (600 mg total) by mouth every 6 (six) hours as needed. 01/05/18   Katrinka Blazing, [place], CNM  ondansetron (ZOFRAN) 4 MG tablet Take 1 tablet (4 mg total) by mouth every 8 (eight) hours as needed for nausea or vomiting. 07/02/18   Arthor Captain, PA-C  Prenatal Vit-Fe Fumarate-FA (PREPLUS) 27-1 MG TABS Take 1 tablet by mouth daily. 10/16/17   Sharyon Cable, CNM  sertraline (ZOLOFT) 50 MG tablet Take 1 tablet (50 mg total) by mouth daily. Patient not taking: Reported on  01/05/2018 11/15/17   Constant, Peggy, MD    Allergies    Patient has no known allergies.  Review of Systems   Review of Systems  Constitutional: Positive for fatigue. Negative for chills and fever.  HENT: Negative for congestion.   Eyes: Positive for photophobia. Negative for visual disturbance.  Respiratory: Negative for shortness of breath.   Cardiovascular: Negative for chest pain.  Gastrointestinal: Negative for abdominal pain, diarrhea and vomiting.  Genitourinary: Negative for dysuria.  Musculoskeletal: Negative for neck pain and neck stiffness.  Skin: Negative for rash.  Neurological: Positive for headaches. Negative for dizziness, light-headedness and numbness.    Physical Exam Updated Vital Signs BP (!) 109/58 (BP Location: Right Arm)   Pulse 62   Temp 98.2 F (36.8 C) (Oral)   Resp 16   Ht 5\' 5"  (1.651 m)    Wt 59 kg   LMP 11/09/2020 (Exact Date)   SpO2 98%   BMI 21.63 kg/m   Physical Exam Vitals and nursing note reviewed.  Constitutional:      Appearance: Normal appearance.  HENT:     Head: Normocephalic.     Mouth/Throat:     Mouth: Mucous membranes are moist.  Eyes:     Pupils: Pupils are equal, round, and reactive to light.  Cardiovascular:     Rate and Rhythm: Normal rate.  Pulmonary:     Effort: Pulmonary effort is normal. No respiratory distress.  Abdominal:     Palpations: Abdomen is soft.     Tenderness: There is no abdominal tenderness.  Musculoskeletal:     Cervical back: No rigidity.  Skin:    General: Skin is warm.  Neurological:     Mental Status: She is alert and oriented to person, place, and time. Mental status is at baseline.  Psychiatric:        Mood and Affect: Mood normal.     ED Results / Procedures / Treatments   Labs (all labs ordered are listed, but only abnormal results are displayed) Labs Reviewed  CBC WITH DIFFERENTIAL/PLATELET  BASIC METABOLIC PANEL  PREGNANCY, URINE    EKG None  Radiology No results found.  Procedures Procedures   Medications Ordered in ED Medications  sodium chloride 0.9 % bolus 1,000 mL (has no administration in time range)  ketorolac (TORADOL) 30 MG/ML injection 30 mg (has no administration in time range)  dexamethasone (DECADRON) injection 10 mg (has no administration in time range)  metoCLOPramide (REGLAN) injection 5 mg (has no administration in time range)  diphenhydrAMINE (BENADRYL) injection 12.5 mg (has no administration in time range)    ED Course  I have reviewed the triage vital signs and the nursing notes.  Pertinent labs & imaging results that were available during my care of the patient were reviewed by me and considered in my medical decision making (see chart for details).    MDM Rules/Calculators/A&P                          30 year old female presents the emergency department with  ongoing migraine.  There is not appear to be any red flag symptoms of this current migraine, vitals are normal and stable, no neck pain/stiffness.  Will evaluate with lab and pregnancy.  If pregnancy test is negative we will proceed with IV medications, migraine cocktail.  After medication patient states that the headache is completely resolved.  She is now sitting up, comfortable and conversational.  Requesting to be discharged home.  Patient will be discharged and treated as an outpatient.  Discharge plan and strict return to ED precautions discussed, patient verbalizes understanding and agreement.  Final Clinical Impression(s) / ED Diagnoses Final diagnoses:  None    Rx / DC Orders ED Discharge Orders    None       Rozelle Logan, DO 11/13/20 1026

## 2020-11-13 NOTE — Discharge Instructions (Signed)
You have been seen and discharged from the emergency department.  You may continue to take over the counter medication for headache relief.  Follow-up with your primary provider for reevaluation. Take home medications as prescribed. If you have any worsening symptoms or further concerns for health please return to an emergency department for further evaluation.

## 2020-11-13 NOTE — ED Triage Notes (Signed)
Patient reports migraine x 2 days with nausea, photophobia.  Hx of migraines, normally takes Excedrin migraine but no relief this time.

## 2021-02-24 ENCOUNTER — Emergency Department (HOSPITAL_BASED_OUTPATIENT_CLINIC_OR_DEPARTMENT_OTHER): Payer: Self-pay

## 2021-02-24 ENCOUNTER — Other Ambulatory Visit: Payer: Self-pay

## 2021-02-24 ENCOUNTER — Encounter (HOSPITAL_BASED_OUTPATIENT_CLINIC_OR_DEPARTMENT_OTHER): Payer: Self-pay | Admitting: Emergency Medicine

## 2021-02-24 ENCOUNTER — Emergency Department (HOSPITAL_BASED_OUTPATIENT_CLINIC_OR_DEPARTMENT_OTHER)
Admission: EM | Admit: 2021-02-24 | Discharge: 2021-02-24 | Disposition: A | Discharge status: Home / Self Care | Payer: Self-pay | Attending: Emergency Medicine | Admitting: Emergency Medicine

## 2021-02-24 DIAGNOSIS — B9689 Other specified bacterial agents as the cause of diseases classified elsewhere: Secondary | ICD-10-CM

## 2021-02-24 DIAGNOSIS — N390 Urinary tract infection, site not specified: Secondary | ICD-10-CM

## 2021-02-24 DIAGNOSIS — N6310 Unspecified lump in the right breast, unspecified quadrant: Secondary | ICD-10-CM | POA: Diagnosis not present

## 2021-02-24 DIAGNOSIS — Z87891 Personal history of nicotine dependence: Secondary | ICD-10-CM | POA: Insufficient documentation

## 2021-02-24 DIAGNOSIS — R102 Pelvic and perineal pain: Secondary | ICD-10-CM

## 2021-02-24 DIAGNOSIS — J45909 Unspecified asthma, uncomplicated: Secondary | ICD-10-CM | POA: Insufficient documentation

## 2021-02-24 DIAGNOSIS — N760 Acute vaginitis: Secondary | ICD-10-CM | POA: Insufficient documentation

## 2021-02-24 LAB — CBC WITH DIFFERENTIAL/PLATELET
Abs Immature Granulocytes: 0.01 10*3/uL (ref 0.00–0.07)
Basophils Absolute: 0 10*3/uL (ref 0.0–0.1)
Basophils Relative: 1 %
Eosinophils Absolute: 0.4 10*3/uL (ref 0.0–0.5)
Eosinophils Relative: 7 %
HCT: 33.3 % — ABNORMAL LOW (ref 36.0–46.0)
Hemoglobin: 11.9 g/dL — ABNORMAL LOW (ref 12.0–15.0)
Immature Granulocytes: 0 %
Lymphocytes Relative: 43 %
Lymphs Abs: 2.1 10*3/uL (ref 0.7–4.0)
MCH: 27.6 pg (ref 26.0–34.0)
MCHC: 35.7 g/dL (ref 30.0–36.0)
MCV: 77.3 fL — ABNORMAL LOW (ref 80.0–100.0)
Monocytes Absolute: 0.4 10*3/uL (ref 0.1–1.0)
Monocytes Relative: 8 %
Neutro Abs: 2 10*3/uL (ref 1.7–7.7)
Neutrophils Relative %: 41 %
Platelets: 297 10*3/uL (ref 150–400)
RBC: 4.31 MIL/uL (ref 3.87–5.11)
RDW: 12.4 % (ref 11.5–15.5)
WBC: 4.9 10*3/uL (ref 4.0–10.5)
nRBC: 0 % (ref 0.0–0.2)

## 2021-02-24 LAB — BASIC METABOLIC PANEL
Anion gap: 7 (ref 5–15)
BUN: 10 mg/dL (ref 6–20)
CO2: 27 mmol/L (ref 22–32)
Calcium: 9 mg/dL (ref 8.9–10.3)
Chloride: 104 mmol/L (ref 98–111)
Creatinine, Ser: 0.58 mg/dL (ref 0.44–1.00)
GFR, Estimated: >60 mL/min (ref >60)
Glucose, Bld: 97 mg/dL (ref 70–99)
Potassium: 3.6 mmol/L (ref 3.5–5.1)
Sodium: 138 mmol/L (ref 135–145)

## 2021-02-24 LAB — PREGNANCY, URINE: Preg Test, Ur: NEGATIVE

## 2021-02-24 LAB — URINALYSIS, ROUTINE W REFLEX MICROSCOPIC
Bilirubin Urine: NEGATIVE
Glucose, UA: NEGATIVE mg/dL
Ketones, ur: NEGATIVE mg/dL
Nitrite: NEGATIVE
Protein, ur: NEGATIVE mg/dL
Specific Gravity, Urine: <1.005 — ABNORMAL LOW (ref 1.005–1.030)
pH: 7 (ref 5.0–8.0)

## 2021-02-24 LAB — WET PREP, GENITAL
Sperm: NONE SEEN
Trich, Wet Prep: NONE SEEN
Yeast Wet Prep HPF POC: NONE SEEN

## 2021-02-24 LAB — URINALYSIS, MICROSCOPIC (REFLEX)

## 2021-02-24 MED ORDER — METRONIDAZOLE 500 MG PO TABS: 500.0000 mg | ORAL_TABLET | Freq: Two times a day (BID) | ORAL | 0 refills | Status: DC

## 2021-02-24 MED ORDER — CEPHALEXIN 500 MG PO CAPS: 500.0000 mg | ORAL_CAPSULE | Freq: Four times a day (QID) | ORAL | 0 refills | Status: DC

## 2021-02-24 MED ORDER — MORPHINE SULFATE (PF) 4 MG/ML IV SOLN
4.0000 mg | Freq: Once | INTRAVENOUS | Status: AC
  Administered 2021-02-24: 4 mg via INTRAVENOUS
  Filled 2021-02-24: qty 1

## 2021-02-24 NOTE — ED Provider Notes (Signed)
[facility] EMERGENCY DEPARTMENT Provider Note   CSN: [id number] Arrival date & time: 02/24/21  8413     History Chief Complaint  Patient presents with   Pelvic Pain    Julia Morgan is a 30 y.o. female.  She is complaining of pelvic pain that woke him up from sleep around 5 AM this morning.  Sharp stabbing or left-sided although across the whole pelvic area.  Denies prior history of same.  No urinary symptoms.  Last menstrual period was 2 weeks ago.  She noticed a little bit of spotting no discharge.  She is also noticed a lump in her right breast that is been hurting for the last 3 days.  No fevers or chills shortness of breath cough vomiting.  1 episode of diarrhea.  The history is provided by the patient.  Pelvic Pain This is a new problem. The current episode started 1 to 2 hours ago. The problem occurs constantly. The problem has not changed since onset.Pertinent negatives include no chest pain, no abdominal pain, no headaches and no shortness of breath. The symptoms are aggravated by walking. Nothing relieves the symptoms. She has tried rest for the symptoms. The treatment provided no relief.      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   Mental disorder    Migraines    PONV (postoperative nausea and vomiting)     Patient Active Problem List   Diagnosis Date Noted   IUD (intrauterine device) in place 01/07/2018   Depression affecting pregnancy, antepartum 11/03/2017    Past Surgical History:  Procedure Laterality Date   NO PAST SURGERIES       OB History     Gravida  3   Para  3   Term  3   Preterm      AB      Living  2      SAB      IAB      Ectopic      Multiple  0   Live Births  2           Family History  Problem Relation Age of Onset   Hyperlipidemia Mother    Hypertension Mother     Hyperlipidemia Maternal Grandmother    Hypertension Maternal Grandmother    Cancer - Cervical Paternal Grandmother     Social History   Tobacco Use   Smoking status: Former    Pack years: 0.00   Smokeless tobacco: Never  Vaping Use   Vaping Use: Never used  Substance Use Topics   Alcohol use: Yes    Comment: soicial   Drug use: Not Currently    Types: Marijuana    Home Medications Prior to Admission medications   Medication Sig Start Date End Date Taking? Authorizing Provider  albuterol (PROVENTIL HFA;VENTOLIN HFA) 108 (90 Base) MCG/ACT inhaler Inhale 1-2 puffs into the lungs every 6 (six) hours as needed for wheezing or shortness of breath. 10/24/16   Lurene Shadow, PA-C  diphenhydramine-acetaminophen (TYLENOL PM) 25-500 MG TABS tablet Take 1 tablet by mouth at bedtime as needed (sleep).    [provider]  ibuprofen (ADVIL,MOTRIN) 600 MG tablet Take 1 tablet (600 mg total) by mouth every 6 (six) hours as needed. 01/05/18   Katrinka Blazing, [place],  CNM  ondansetron (ZOFRAN) 4 MG tablet Take 1 tablet (4 mg total) by mouth every 8 (eight) hours as needed for nausea or vomiting. 07/02/18   Arthor CaptainHarris, Abigail, PA-C  Prenatal Vit-Fe Fumarate-FA (PREPLUS) 27-1 MG TABS Take 1 tablet by mouth daily. 10/16/17   Sharyon Cableogers, Veronica C, CNM  sertraline (ZOLOFT) 50 MG tablet Take 1 tablet (50 mg total) by mouth daily. Patient not taking: Reported on 01/05/2018 11/15/17   Constant, Peggy, MD    Allergies    Patient has no known allergies.  Review of Systems   Review of Systems  Constitutional:  Negative for fever.  HENT:  Negative for sore throat.   Eyes:  Negative for visual disturbance.  Respiratory:  Negative for shortness of breath.   Cardiovascular:  Negative for chest pain.  Gastrointestinal:  Negative for abdominal pain.  Genitourinary:  Positive for pelvic pain. Negative for dysuria.  Musculoskeletal:  Negative for neck pain.  Skin:  Negative for rash.  Neurological:  Negative for  headaches.   Physical Exam Updated Vital Signs BP 107/68 (BP Location: Right Arm)   Pulse 69   Temp 98.4 F (36.9 C) (Oral)   Resp 14   Ht 5\' 5"  (1.651 m)   Wt 61.2 kg   LMP 02/09/2021 (Exact Date)   SpO2 98%   BMI 22.47 kg/m   Physical Exam Vitals and nursing note reviewed.  Constitutional:      General: She is not in acute distress.    Appearance: She is well-developed.  HENT:     Head: Normocephalic and atraumatic.  Eyes:     Conjunctiva/sclera: Conjunctivae normal.  Cardiovascular:     Rate and Rhythm: Normal rate and regular rhythm.     Heart sounds: No murmur heard. Pulmonary:     Effort: Pulmonary effort is normal. No respiratory distress.     Breath sounds: Normal breath sounds.  Abdominal:     Palpations: Abdomen is soft.     Tenderness: There is no abdominal tenderness.  Genitourinary:    Comments: Pelvic exam done with Marva as chaperone.  Normal external genitalia.  Small amount of thin white discharge in vault.  No cervical motion tenderness.  is tender over her uterus and left adnexa.  No masses appreciated.   Breast exam done with Marva as chaperone.  She has piercings bilaterally.  She has a tender to centimeter mass just medial to the nipple on the right breast.  Tender.  No overlying erythema.  No discharge.  Left breast unremarkable. Musculoskeletal:        General: Normal range of motion.     Cervical back: Neck supple.     Right lower leg: No edema.     Left lower leg: No edema.  Skin:    General: Skin is warm and dry.  Neurological:     General: No focal deficit present.     Mental Status: She is alert.    ED Results / Procedures / Treatments   Labs (all labs ordered are listed, but only abnormal results are displayed) Labs Reviewed  WET PREP, GENITAL - Abnormal; Notable for the following components:      Result Value   Clue Cells Wet Prep HPF POC PRESENT (*)    WBC, Wet Prep HPF POC MODERATE (*)    All other components within normal  limits  URINALYSIS, ROUTINE W REFLEX MICROSCOPIC - Abnormal; Notable for the following components:   APPearance HAZY (*)    Specific Gravity, Urine <1.005 (*)  Hgb urine dipstick TRACE (*)    Leukocytes,Ua LARGE (*)    All other components within normal limits  CBC WITH DIFFERENTIAL/PLATELET - Abnormal; Notable for the following components:   Hemoglobin 11.9 (*)    HCT 33.3 (*)    MCV 77.3 (*)    All other components within normal limits  URINALYSIS, MICROSCOPIC (REFLEX) - Abnormal; Notable for the following components:   Bacteria, UA FEW (*)    All other components within normal limits  PREGNANCY, URINE  BASIC METABOLIC PANEL  GC/CHLAMYDIA PROBE AMP ([place]) NOT AT [facility]    EKG None  Radiology US PELVIC COMPLETE W TRANSVAGINAL AND TORSION R/O  Result Date: 02/24/2021 CLINICAL DATA:  Pelvic pain EXAM: TRANSABDOMINAL AND TRANSVAGINAL ULTRASOUND OF PELVIS DOPPLER ULTRASOUND OF OVARIES TECHNIQUE: Both transabdominal and transvaginal ultrasound examinations of the pelvis were performed. Transabdominal technique was performed for global imaging of the pelvis including uterus, ovaries, adnexal regions, and pelvic cul-de-sac. It was necessary to proceed with endovaginal exam following the transabdominal exam to visualize the ovaries. Color and duplex Doppler ultrasound was utilized to evaluate blood flow to the ovaries. COMPARISON:  06/10/2012 FINDINGS: Uterus Measurements: 8 x 4 x 5 cm = volume: 90 mL. No fibroids or other mass visualized. Endometrium Thickness: 13 mm.  IUD in expected position. Right ovary Measurements: 27 x 22 x 27 mm = volume: 9 mL. Normal appearance/no adnexal mass. Left ovary Measurements: 26 x 18 x 22 mm = volume: 5 mL. Normal appearance/no adnexal mass. Pulsed Doppler evaluation of both ovaries demonstrates normal low-resistance arterial and venous waveforms. Other findings Small volume simple pelvic fluid IMPRESSION: 1. No specific cause for symptoms. 2. Simple  pelvic fluid, often physiologic. 3. IUD in expected position. Electronically Signed   By: Marnee Spring M.D.   On: 02/24/2021 09:21    Procedures Procedures   Medications Ordered in ED Medications  morphine 4 MG/ML injection 4 mg (4 mg Intravenous Given 02/24/21 0735)    ED Course  I have reviewed the triage vital signs and the nursing notes.  Pertinent labs & imaging results that were available during my care of the patient were reviewed by me and considered in my medical decision making (see chart for details).    MDM Rules/Calculators/A&P                         This patient complains of acute pain pelvic left greater than right along with right breast mass; this involves an extensive number of treatment Options and is a complaint that carries with it a high risk of complications and Morbidity. The differential includes torsion, PID, vaginitis, UTI, mittelschmerz, breast abscess,  I ordered, reviewed and interpreted labs, which included CBC with normal white count stable hemoglobin, chemistries normal from priors, with clue cells, urinalysis 21-50 whites few bacteria, GC and chlamydia pending at time of discharge  I ordered medication IV pain medication nausea medication and fluids I ordered imaging studies which included pelvic ultrasound and I independently    visualized and interpreted imaging which showed no acute findings Additional history obtained from patient's boyfriend Previous records obtained and reviewed in epic, no recent admissions  After the interventions stated above, I reevaluated the patient and found patient to be comfortable appearing and nontoxic.  We discussed the results of her testing.  Recommended warm compress over her breasts and we will trial on antibiotics for possible UTI and BV.  Recommended of breast does not improve  she may require further imaging discussed return to the emergency department worsening or concerning symptoms.   Final Clinical  Impression(s) / ED Diagnoses Final diagnoses:  Pelvic pain in female  BV (bacterial vaginosis)  Lower urinary tract infectious disease  Breast mass, right    Rx / DC Orders ED Discharge Orders          Ordered    cephALEXin (KEFLEX) 500 MG capsule  4 times daily        02/24/21 1000    metroNIDAZOLE (FLAGYL) 500 MG tablet  2 times daily        02/24/21 1000             Terrilee Files, MD 02/24/21 1909

## 2021-02-24 NOTE — Discharge Instructions (Signed)
You are seen in the emergency department for pelvic pain.  Your urinalysis showed a possible urinary tract infection.  You also tested positive for bacterial vaginosis.  Gonorrhea and Chlamydia cultures are pending at time of discharge.  You can follow this up in MyChart.  Your pelvic ultrasound did not show any significant findings.  Your IUD was in place.  Please follow-up with your gynecologist.  Return to the Emergency Department if any worsening or concerning symptoms

## 2021-02-24 NOTE — ED Notes (Signed)
US at bedside

## 2021-02-24 NOTE — ED Triage Notes (Signed)
Pt is c/o pelvic pain   Pt states she woke up with it  Pt states it is a sharp pain that runs through her "lady parts"  Worse when she walks  Pt also states she has a lump in her right breast that has been hurting for about 3 days

## 2021-02-25 LAB — GC/CHLAMYDIA PROBE AMP (~~LOC~~) NOT AT ARMC
Chlamydia: NEGATIVE
Comment: NEGATIVE
Comment: NORMAL
Neisseria Gonorrhea: NEGATIVE

## 2021-03-15 DIAGNOSIS — N611 Abscess of the breast and nipple: Secondary | ICD-10-CM | POA: Diagnosis not present

## 2021-04-25 ENCOUNTER — Emergency Department (HOSPITAL_BASED_OUTPATIENT_CLINIC_OR_DEPARTMENT_OTHER)
Admission: EM | Admit: 2021-04-25 | Discharge: 2021-04-25 | Disposition: A | Discharge status: Home / Self Care | Payer: BC Managed Care – PPO | Attending: Emergency Medicine | Admitting: Emergency Medicine

## 2021-04-25 ENCOUNTER — Encounter (HOSPITAL_BASED_OUTPATIENT_CLINIC_OR_DEPARTMENT_OTHER): Payer: Self-pay

## 2021-04-25 ENCOUNTER — Other Ambulatory Visit: Payer: Self-pay

## 2021-04-25 ENCOUNTER — Emergency Department (HOSPITAL_BASED_OUTPATIENT_CLINIC_OR_DEPARTMENT_OTHER): Payer: BC Managed Care – PPO

## 2021-04-25 DIAGNOSIS — Z87891 Personal history of nicotine dependence: Secondary | ICD-10-CM | POA: Diagnosis not present

## 2021-04-25 DIAGNOSIS — J45909 Unspecified asthma, uncomplicated: Secondary | ICD-10-CM | POA: Diagnosis not present

## 2021-04-25 DIAGNOSIS — S20212A Contusion of left front wall of thorax, initial encounter: Secondary | ICD-10-CM | POA: Diagnosis not present

## 2021-04-25 DIAGNOSIS — S0285XA Fracture of orbit, unspecified, initial encounter for closed fracture: Secondary | ICD-10-CM | POA: Insufficient documentation

## 2021-04-25 DIAGNOSIS — S0591XA Unspecified injury of right eye and orbit, initial encounter: Secondary | ICD-10-CM | POA: Diagnosis not present

## 2021-04-25 DIAGNOSIS — W01198A Fall on same level from slipping, tripping and stumbling with subsequent striking against other object, initial encounter: Secondary | ICD-10-CM | POA: Diagnosis not present

## 2021-04-25 MED ORDER — HYDROCODONE-ACETAMINOPHEN 5-325 MG PO TABS: 1.0000 | ORAL_TABLET | Freq: Four times a day (QID) | ORAL | 0 refills | Status: DC | PRN

## 2021-04-25 MED ORDER — HYDROCODONE-ACETAMINOPHEN 5-325 MG PO TABS
1.0000 | ORAL_TABLET | Freq: Once | ORAL | Status: AC
  Administered 2021-04-25: 1 via ORAL
  Filled 2021-04-25: qty 1

## 2021-04-25 MED ORDER — IBUPROFEN 400 MG PO TABS
400.0000 mg | ORAL_TABLET | Freq: Once | ORAL | Status: AC
  Administered 2021-04-25: 400 mg via ORAL
  Filled 2021-04-25: qty 1

## 2021-04-25 MED ORDER — CEPHALEXIN 500 MG PO CAPS: 500.0000 mg | ORAL_CAPSULE | Freq: Four times a day (QID) | ORAL | 0 refills | Status: DC

## 2021-04-25 NOTE — Discharge Instructions (Addendum)
You have a broken nose and several broken bones around your eye socket.  It is important for you to start antibiotics and you can use the pain medicine as needed.  You can also take an ibuprofen with the pain medicine to help with further pain.  No signs of broken ribs today.  If you start running fever, start having loss of vision in your eye or other concerns return to the emergency room immediately.
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
## 2021-04-27 ENCOUNTER — Ambulatory Visit (INDEPENDENT_AMBULATORY_CARE_PROVIDER_SITE_OTHER): Payer: BC Managed Care – PPO | Admitting: Plastic Surgery

## 2021-04-27 ENCOUNTER — Encounter: Payer: Self-pay | Admitting: Plastic Surgery

## 2021-04-27 ENCOUNTER — Other Ambulatory Visit: Payer: Self-pay

## 2021-04-27 DIAGNOSIS — S022XXA Fracture of nasal bones, initial encounter for closed fracture: Secondary | ICD-10-CM | POA: Insufficient documentation
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
## 2021-05-11 ENCOUNTER — Other Ambulatory Visit: Payer: Self-pay

## 2021-05-11 ENCOUNTER — Encounter (HOSPITAL_BASED_OUTPATIENT_CLINIC_OR_DEPARTMENT_OTHER): Payer: Self-pay | Admitting: Plastic Surgery

## 2021-05-13 ENCOUNTER — Telehealth: Payer: Self-pay | Admitting: Plastic Surgery

## 2021-05-13 NOTE — Telephone Encounter (Signed)
Orders have been requested for this patient. Please call Christy to update [phone]. Thank you. 

## 2021-05-14 DIAGNOSIS — N611 Abscess of the breast and nipple: Secondary | ICD-10-CM | POA: Diagnosis not present

## 2021-05-14 NOTE — Telephone Encounter (Signed)
Called and spoke with Christy with [facility] regarding the message below.  She stated she called as a reminder for Dr. Dillingham for orders for the patient.//AB/CMA 

## 2021-05-17 ENCOUNTER — Ambulatory Visit (HOSPITAL_BASED_OUTPATIENT_CLINIC_OR_DEPARTMENT_OTHER): Admission: RE | Admit: 2021-05-17 | Payer: BC Managed Care – PPO | Source: Home / Self Care | Admitting: Plastic Surgery

## 2021-05-17 HISTORY — DX: Anxiety disorder, unspecified: F41.9

## 2021-05-17 SURGERY — CLOSED REDUCTION, FRACTURE, NASAL BONE
Anesthesia: General

## 2021-05-19 ENCOUNTER — Encounter: Payer: 59 | Admitting: Surgical

## 2021-06-01 ENCOUNTER — Encounter: Payer: 59 | Admitting: Physician Assistant

## 2021-07-27 DIAGNOSIS — N611 Abscess of the breast and nipple: Secondary | ICD-10-CM | POA: Diagnosis not present

## 2021-08-03 DIAGNOSIS — J45909 Unspecified asthma, uncomplicated: Secondary | ICD-10-CM | POA: Diagnosis not present

## 2021-08-03 DIAGNOSIS — N611 Abscess of the breast and nipple: Secondary | ICD-10-CM | POA: Diagnosis not present

## 2021-08-03 DIAGNOSIS — N644 Mastodynia: Secondary | ICD-10-CM | POA: Diagnosis not present

## 2021-08-03 DIAGNOSIS — Z872 Personal history of diseases of the skin and subcutaneous tissue: Secondary | ICD-10-CM | POA: Diagnosis not present

## 2021-08-04 DIAGNOSIS — N611 Abscess of the breast and nipple: Secondary | ICD-10-CM | POA: Diagnosis not present

## 2021-08-25 DIAGNOSIS — N611 Abscess of the breast and nipple: Secondary | ICD-10-CM | POA: Diagnosis not present

## 2021-09-01 DIAGNOSIS — N6120 Granulomatous mastitis, unspecified breast: Secondary | ICD-10-CM | POA: Diagnosis not present

## 2021-09-01 DIAGNOSIS — N611 Abscess of the breast and nipple: Secondary | ICD-10-CM | POA: Diagnosis not present

## 2021-09-03 DIAGNOSIS — N6123 Granulomatous mastitis, bilateral breast: Secondary | ICD-10-CM | POA: Diagnosis not present

## 2021-09-03 DIAGNOSIS — N6122 Granulomatous mastitis, left breast: Secondary | ICD-10-CM | POA: Diagnosis not present

## 2021-09-03 DIAGNOSIS — N611 Abscess of the breast and nipple: Secondary | ICD-10-CM | POA: Diagnosis not present

## 2021-09-03 DIAGNOSIS — N6032 Fibrosclerosis of left breast: Secondary | ICD-10-CM | POA: Diagnosis not present

## 2021-09-08 DIAGNOSIS — N6032 Fibrosclerosis of left breast: Secondary | ICD-10-CM | POA: Diagnosis not present

## 2021-09-23 DIAGNOSIS — F1090 Alcohol use, unspecified, uncomplicated: Secondary | ICD-10-CM | POA: Diagnosis not present

## 2021-09-23 DIAGNOSIS — N611 Abscess of the breast and nipple: Secondary | ICD-10-CM | POA: Diagnosis not present

## 2021-11-11 DIAGNOSIS — N644 Mastodynia: Secondary | ICD-10-CM | POA: Diagnosis not present

## 2021-11-11 DIAGNOSIS — F1090 Alcohol use, unspecified, uncomplicated: Secondary | ICD-10-CM | POA: Diagnosis not present

## 2021-11-11 DIAGNOSIS — N611 Abscess of the breast and nipple: Secondary | ICD-10-CM | POA: Diagnosis not present

## 2022-01-13 ENCOUNTER — Other Ambulatory Visit: Payer: Self-pay

## 2022-01-13 ENCOUNTER — Encounter (HOSPITAL_BASED_OUTPATIENT_CLINIC_OR_DEPARTMENT_OTHER): Payer: Self-pay

## 2022-01-13 ENCOUNTER — Emergency Department (HOSPITAL_BASED_OUTPATIENT_CLINIC_OR_DEPARTMENT_OTHER)
Admission: EM | Admit: 2022-01-13 | Discharge: 2022-01-13 | Disposition: A | Discharge status: Home / Self Care | Payer: BC Managed Care – PPO | Attending: Emergency Medicine | Admitting: Emergency Medicine

## 2022-01-13 DIAGNOSIS — N760 Acute vaginitis: Secondary | ICD-10-CM | POA: Insufficient documentation

## 2022-01-13 DIAGNOSIS — D72829 Elevated white blood cell count, unspecified: Secondary | ICD-10-CM | POA: Diagnosis not present

## 2022-01-13 DIAGNOSIS — N390 Urinary tract infection, site not specified: Secondary | ICD-10-CM | POA: Insufficient documentation

## 2022-01-13 DIAGNOSIS — R102 Pelvic and perineal pain: Secondary | ICD-10-CM | POA: Diagnosis not present

## 2022-01-13 DIAGNOSIS — B9689 Other specified bacterial agents as the cause of diseases classified elsewhere: Secondary | ICD-10-CM | POA: Diagnosis not present

## 2022-01-13 LAB — URINALYSIS, ROUTINE W REFLEX MICROSCOPIC
Bilirubin Urine: NEGATIVE
Glucose, UA: NEGATIVE mg/dL
Ketones, ur: NEGATIVE mg/dL
Nitrite: NEGATIVE
Protein, ur: NEGATIVE mg/dL
Specific Gravity, Urine: 1.025 (ref 1.005–1.030)
pH: 5.5 (ref 5.0–8.0)

## 2022-01-13 LAB — URINALYSIS, MICROSCOPIC (REFLEX): WBC, UA: >50 WBC/hpf (ref 0–5)

## 2022-01-13 LAB — WET PREP, GENITAL
Trich, Wet Prep: NONE SEEN
WBC, Wet Prep HPF POC: >=10 — AB (ref <10)
Yeast Wet Prep HPF POC: NONE SEEN

## 2022-01-13 LAB — HIV ANTIBODY (ROUTINE TESTING W REFLEX): HIV Screen 4th Generation wRfx: NONREACTIVE

## 2022-01-13 LAB — PREGNANCY, URINE: Preg Test, Ur: NEGATIVE

## 2022-01-13 MED ORDER — ACETAMINOPHEN 325 MG PO TABS
650.0000 mg | ORAL_TABLET | Freq: Once | ORAL | Status: AC
  Administered 2022-01-13: 650 mg via ORAL

## 2022-01-13 MED ORDER — METRONIDAZOLE 500 MG PO TABS: 500.0000 mg | ORAL_TABLET | Freq: Two times a day (BID) | ORAL | 0 refills | Status: AC

## 2022-01-13 MED ORDER — CEPHALEXIN 500 MG PO CAPS
500.0000 mg | ORAL_CAPSULE | Freq: Two times a day (BID) | ORAL | 0 refills | Status: AC
Start: 2022-01-13 — End: 2022-01-18

## 2022-01-13 MED ORDER — ACETAMINOPHEN 325 MG PO TABS
ORAL_TABLET | ORAL | Status: AC
  Filled 2022-01-13: qty 2

## 2022-01-13 NOTE — ED Triage Notes (Signed)
Complains of "her girl parts" hurting x 2 weeks, points to pelvic region when explaining pain. Pain with urination. Denies abdominal pain or vaginal discharge.  ?

## 2022-01-13 NOTE — Discharge Instructions (Addendum)
It was a pleasure taking care of you today.  Your test results so far have come back showing that you have bacterial vaginosis.  We will treat this with metronidazole which she will take twice daily for 7 days.  I am also concerned that you have a UTI so sent a prescription for Keflex to your pharmacy, you will take this twice daily for 5 days.  If your symptoms do not resolve or your pain worsens please be reevaluated.  The other test we collected will take a few days to come back but will go to your MyChart.  If you have any questions please follow-up with your primary care provider.  I hope you have a wonderful day! ? ? ?

## 2022-01-13 NOTE — ED Provider Notes (Addendum)
?[facility] EMERGENCY DEPARTMENT ?Provider Note ? ? ?CSN: [id number] ?Arrival date & time: 01/13/22  0848 ? ?  ? ?History ? ?Chief Complaint  ?Patient presents with  ? Pelvic Pain  ? ? ?Julia Morgan is a 31 y.o. female. ? ?Patient presents with 1 week history of intermittent lower pelvic pain.  Reports initially she felt discomfort in the pelvic region and then started noticing more discomfort after urination.  Denies any abnormal discharge.  Does have history of BV in the past.  Reports that she is sexually active with 1 partner and they intermittently use condoms.  She does have a copper IUD in place.  Patient was concerned that she may have a UTI and so she increased her water intake and also drink cranberry juice but symptoms did not resolve.  Reports also having episodes of nausea but no vomiting.  No fever, cough congestion, rhinorrhea, headaches, chest pain, shortness of breath. ? ? ?  ? ?Home Medications ?Prior to Admission medications   ?Medication Sig Start Date End Date Taking? Authorizing Provider  ?cephALEXin (KEFLEX) 500 MG capsule Take 1 capsule (500 mg total) by mouth 2 (two) times daily for 5 days. 01/13/22 01/18/22 Yes Derrel Nip, MD  ?metroNIDAZOLE (FLAGYL) 500 MG tablet Take 1 tablet (500 mg total) by mouth 2 (two) times daily for 7 days. 01/13/22 01/20/22 Yes Derrel Nip, MD  ?albuterol (PROVENTIL HFA;VENTOLIN HFA) 108 (90 Base) MCG/ACT inhaler Inhale 1-2 puffs into the lungs every 6 (six) hours as needed for wheezing or shortness of breath. 10/24/16   Lurene Shadow, PA-C  ?HYDROcodone-acetaminophen (NORCO/VICODIN) 5-325 MG tablet Take 1 tablet by mouth every 6 (six) hours as needed for severe pain. 04/25/21   Gwyneth Sprout, MD  ?ibuprofen (ADVIL,MOTRIN) 600 MG tablet Take 1 tablet (600 mg total) by mouth every 6 (six) hours as needed. 01/05/18   Katrinka Blazing, [place], CNM  ?   ? ?Allergies    ?Patient has no known allergies.   ? ?Review of Systems   ?Review of Systems   ?Constitutional:  Negative for chills and fever.  ?HENT:  Negative for congestion and rhinorrhea.   ?Eyes:  Negative for visual disturbance.  ?Respiratory:  Negative for shortness of breath.   ?Cardiovascular:  Negative for chest pain.  ?Gastrointestinal:  Positive for abdominal distention and nausea. Negative for constipation, diarrhea and vomiting.  ?Genitourinary:  Positive for dysuria, pelvic pain and vaginal pain. Negative for vaginal discharge.  ?Musculoskeletal:  Negative for myalgias.  ?All other systems reviewed and are negative. ? ?Physical Exam ?Updated Vital Signs ?BP 125/82 (BP Location: Right Arm)   Pulse 70   Temp 98.2 ?F (36.8 ?C) (Oral)   Resp 16   Ht 5\' 5"  (1.651 m)   Wt 59 kg   LMP 12/15/2021 (Approximate)   SpO2 100%   BMI 21.63 kg/m?  ?Physical Exam ?Vitals reviewed. Exam conducted with a chaperone present.  ?Constitutional:   ?   Appearance: Normal appearance.  ?HENT:  ?   Head: Normocephalic and atraumatic.  ?   Nose: Nose normal.  ?   Mouth/Throat:  ?   Mouth: Mucous membranes are moist.  ?Eyes:  ?   Extraocular Movements: Extraocular movements intact.  ?Cardiovascular:  ?   Rate and Rhythm: Normal rate and regular rhythm.  ?Pulmonary:  ?   Effort: Pulmonary effort is normal.  ?   Breath sounds: Normal breath sounds.  ?Abdominal:  ?   General: Abdomen is flat. Bowel sounds are normal.  ?  Tenderness: There is right CVA tenderness. There is no left CVA tenderness.  ?Genitourinary: ?   General: Normal vulva.  ?   Pubic Area: No rash.   ?   Labia:     ?   Right: Tenderness present.   ?   Vagina: Vaginal discharge (watery) and bleeding present.  ?   Cervix: Cervical bleeding present. No cervical motion tenderness, friability or erythema.  ?   Comments: Did not visualize strings from copper IUD, did not attempt to tease strings out ?Musculoskeletal:     ?   General: Normal range of motion.  ?Skin: ?   General: Skin is warm.  ?Neurological:  ?   Mental Status: She is alert.  ? ? ?ED  Results / Procedures / Treatments   ?Labs ?(all labs ordered are listed, but only abnormal results are displayed) ?Labs Reviewed  ?WET PREP, GENITAL - Abnormal; Notable for the following components:  ?    Result Value  ? Clue Cells Wet Prep HPF POC PRESENT (*)   ? WBC, Wet Prep HPF POC >=10 (*)   ? All other components within normal limits  ?URINALYSIS, ROUTINE W REFLEX MICROSCOPIC - Abnormal; Notable for the following components:  ? APPearance HAZY (*)   ? Hgb urine dipstick MODERATE (*)   ? Leukocytes,Ua SMALL (*)   ? All other components within normal limits  ?URINALYSIS, MICROSCOPIC (REFLEX) - Abnormal; Notable for the following components:  ? Bacteria, UA FEW (*)   ? Non Squamous Epithelial PRESENT (*)   ? All other components within normal limits  ?URINE CULTURE  ?PREGNANCY, URINE  ?RPR  ?HIV ANTIBODY (ROUTINE TESTING W REFLEX)  ?GC/CHLAMYDIA PROBE AMP ([place]) NOT AT [facility]  ? ? ?EKG ?None ? ?Radiology ?No results found. ? ?Procedures ?Procedures  ? ? ?Medications Ordered in ED ?Medications  ?acetaminophen (TYLENOL) tablet 650 mg (has no administration in time range)  ? ? ?ED Course/ Medical Decision Making/ A&P ?  ?                        ?Medical Decision Making ?Patient presents with 1 week history of worsening pelvic pain.  Also notices dysuria.  On arrival vital signs within normal limits.  Urinalysis showing hazy appearance, moderate hemoglobin, small leukocytes, few bacteria with present non-squamous epithelial cells. Pregnancy test negative.  Wet prep collected showing positive clue cells.  Physical exam also showing right-sided costovertebral angle tenderness.  Treating patient for bacterial vaginosis as well as UTI/pyelonephritis.  Prescription sent to patient's pharmacy.  Also collected STI testing including HIV, RPR, GC/chlamydia.  Strict return precautions given and recommended follow-up with PCP.  Patient discharged home. ? ?Amount and/or Complexity of Data Reviewed ?Labs:  ordered. ? ?Risk ?OTC drugs. ?Prescription drug management. ? ? ? ?Final Clinical Impression(s) / ED Diagnoses ?Final diagnoses:  ?Bacterial vaginosis  ?Urinary tract infection with hematuria, site unspecified  ? ? ?Rx / DC Orders ?ED Discharge Orders   ? ?      Ordered  ?  metroNIDAZOLE (FLAGYL) 500 MG tablet  2 times daily       ? 01/13/22 0936  ?  cephALEXin (KEFLEX) 500 MG capsule  2 times daily       ? 01/13/22 0936  ? ?  ?  ? ?  ? ? ?  ?Derrel Nip, MD ?01/13/22 [phone] ? ?  ?Derrel Nip, MD ?01/13/22 [phone] ? ?  ?Jacalyn Lefevre, MD ?01/13/22 1049 ? ?

## 2022-01-14 LAB — GC/CHLAMYDIA PROBE AMP (~~LOC~~) NOT AT ARMC
Chlamydia: NEGATIVE
Comment: NEGATIVE
Comment: NORMAL
Neisseria Gonorrhea: POSITIVE — AB

## 2022-01-14 LAB — URINE CULTURE: Culture: NO GROWTH

## 2022-01-14 LAB — RPR: RPR Ser Ql: NONREACTIVE

## 2022-01-18 ENCOUNTER — Encounter: Payer: Self-pay | Admitting: Obstetrics and Gynecology

## 2022-01-18 ENCOUNTER — Other Ambulatory Visit (HOSPITAL_COMMUNITY)
Admission: RE | Admit: 2022-01-18 | Discharge: 2022-01-18 | Disposition: A | Discharge status: Home / Self Care | Payer: BC Managed Care – PPO | Source: Ambulatory Visit | Attending: Obstetrics and Gynecology | Admitting: Obstetrics and Gynecology

## 2022-01-18 ENCOUNTER — Ambulatory Visit (INDEPENDENT_AMBULATORY_CARE_PROVIDER_SITE_OTHER): Payer: BC Managed Care – PPO | Admitting: Obstetrics and Gynecology

## 2022-01-18 VITALS — BP 113/79 | HR 64 | Ht 65.0 in | Wt 126.0 lb

## 2022-01-18 DIAGNOSIS — Z01419 Encounter for gynecological examination (general) (routine) without abnormal findings: Secondary | ICD-10-CM | POA: Diagnosis not present

## 2022-01-18 DIAGNOSIS — L0231 Cutaneous abscess of buttock: Secondary | ICD-10-CM | POA: Diagnosis not present

## 2022-01-18 DIAGNOSIS — A549 Gonococcal infection, unspecified: Secondary | ICD-10-CM | POA: Insufficient documentation

## 2022-01-18 MED ORDER — SULFAMETHOXAZOLE-TRIMETHOPRIM 800-160 MG PO TABS: 1.0000 | ORAL_TABLET | Freq: Two times a day (BID) | ORAL | 0 refills | Status: DC

## 2022-01-18 MED ORDER — CEFTRIAXONE SODIUM 500 MG IJ SOLR
500.0000 mg | Freq: Once | INTRAMUSCULAR | Status: AC
  Administered 2022-01-18: 500 mg via INTRAMUSCULAR

## 2022-01-18 NOTE — Progress Notes (Signed)
Pt positive for Gonorrhea at ED on 5/11- 500mg  Rocephin given today

## 2022-01-18 NOTE — Progress Notes (Signed)
GYNECOLOGY ANNUAL PREVENTATIVE CARE ENCOUNTER NOTE  History:     Julia Morgan is a 31 y.o. [phone] female here for a routine annual gynecologic exam.  Current complaints: recent diagnoses with gonorrhea on 5/11 at urgent care. She would like treatment today. Her partner has been treated.    Denies abnormal vaginal bleeding, discharge, pelvic pain, problems with intercourse or other gynecologic concerns.  Female partner. Safe relationship. Very happy.  Has skin condition on her breast that she see's a breast surgeon regularly for.   Gynecologic History Patient's last menstrual period was 01/13/2022. Contraception: IUD copper IUD placed 2019 Last Pap: 2019. 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   Current Facility-Administered Medications on File Prior to Visit  Medication Dose Route Frequency Provider Last Rate Last Admin   PARAGARD INTRAUTERINE COPPER IUD   Intrauterine Once [place], [place], CNM        No Known Allergies  Social History:  reports that she has quit smoking. She has never used smokeless tobacco. She reports current alcohol use. She reports that she does not currently use drugs after having used the following drugs: Marijuana.  Family  History  Problem Relation Age of Onset   Hyperlipidemia Mother    Hypertension Mother    Hyperlipidemia Maternal Grandmother    Hypertension Maternal Grandmother    Cancer - Cervical Paternal Grandmother     The following portions of the patient's history were reviewed and updated as appropriate: allergies, current medications, past family history, past medical history, past social history, past surgical history and problem list.  Review of Systems Pertinent items noted in HPI and remainder of comprehensive ROS otherwise negative.  Physical Exam:  BP 113/79   Pulse 64   Ht 5\' 5"  (1.651 m)   Wt 126 lb (57.2 kg)   LMP 01/13/2022   BMI 20.97 kg/m  CONSTITUTIONAL: Well-developed, well-nourished female in no acute distress.  HENT:  Normocephalic, atraumatic, External right and left ear normal.  EYES: Conjunctivae and EOM are normal. Pupils are equal, round, and reactive to light. No scleral icterus.  NECK: Normal range of motion, supple, no masses.  Normal thyroid.  SKIN: Skin is warm and dry. No rash noted. Not diaphoretic. No erythema. No pallor. MUSCULOSKELETAL: Normal range of motion. No tenderness.  No cyanosis, clubbing, or edema. NEUROLOGIC: Alert and oriented to person, place, and time. Normal reflexes, muscle tone coordination.  PSYCHIATRIC: Normal mood and affect. Normal behavior. Normal judgment and thought content. CARDIOVASCULAR: Normal heart rate noted, regular rhythm RESPIRATORY: Clear to auscultation bilaterally. Effort and breath sounds normal, no problems with respiration noted. BREASTS: Symmetric in size. Drainage sites of both breast noted, no drainage at this time. Performed in the presence of a chaperone. ABDOMEN: Soft, no distention noted.  No tenderness, rebound or guarding.  PELVIC: Normal appearing external genitalia and urethral meatus; normal appearing vaginal mucosa and cervix.  No abnormal vaginal discharge noted.  Pap smear obtained.  IUD strings  visualized. Normal uterine size, no other palpable masses, no uterine or adnexal tenderness.  Performed in the presence of a chaperone.  Physical Exam Genitourinary:     Genitourinary Comments: 1 cm x 1 cm circular abscess, + fluctuants, minimal erythema. Incision with needle puncture. + drainage of clear/milky fluid noted.         Assessment and Plan:   1. Gonorrhea  - cefTRIAXone (ROCEPHIN) injection 500 mg - Cytology - PAP( [place]) - Test of cure in 3-4 weeks   2. Women's annual routine gynecological examination  - Cytology - PAP( [facility]) - Recent STI testing is negative.   3. Cutaneous abscess of buttock  - Rx: Bactrim  - Referral to Dermatology given abscess on breast and buttocks.  - Cytology - PAP( [place])  - Breast abscesses managed by general surgery.    Will follow up results of pap smear and manage accordingly. Routine preventative health maintenance measures emphasized. Please refer to After Visit Summary for other counseling recommendations.     Anjelita Sheahan, Harolyn Rutherford, NP [facility] [employer], [facility]

## 2022-01-18 NOTE — Patient Instructions (Addendum)
Dr. Jorja Loa [phone] ?Dermatology.  ?

## 2022-01-24 LAB — CYTOLOGY - PAP
Comment: NEGATIVE
Diagnosis: UNDETERMINED — AB
High risk HPV: NEGATIVE

## 2022-02-08 ENCOUNTER — Ambulatory Visit: Payer: BC Managed Care – PPO | Admitting: *Deleted

## 2022-03-18 DIAGNOSIS — M545 Low back pain, unspecified: Secondary | ICD-10-CM | POA: Diagnosis not present

## 2022-03-18 DIAGNOSIS — R200 Anesthesia of skin: Secondary | ICD-10-CM | POA: Diagnosis not present

## 2022-03-18 DIAGNOSIS — U071 COVID-19: Secondary | ICD-10-CM | POA: Diagnosis not present

## 2022-03-18 DIAGNOSIS — K760 Fatty (change of) liver, not elsewhere classified: Secondary | ICD-10-CM | POA: Diagnosis not present

## 2022-03-18 DIAGNOSIS — Z3202 Encounter for pregnancy test, result negative: Secondary | ICD-10-CM | POA: Diagnosis not present

## 2022-03-18 DIAGNOSIS — R109 Unspecified abdominal pain: Secondary | ICD-10-CM | POA: Diagnosis not present

## 2022-08-01 ENCOUNTER — Emergency Department (HOSPITAL_BASED_OUTPATIENT_CLINIC_OR_DEPARTMENT_OTHER): Payer: BC Managed Care – PPO

## 2022-08-01 ENCOUNTER — Encounter (HOSPITAL_BASED_OUTPATIENT_CLINIC_OR_DEPARTMENT_OTHER): Payer: Self-pay

## 2022-08-01 ENCOUNTER — Encounter (HOSPITAL_COMMUNITY): Payer: Self-pay

## 2022-08-01 ENCOUNTER — Emergency Department (HOSPITAL_BASED_OUTPATIENT_CLINIC_OR_DEPARTMENT_OTHER)
Admission: EM | Admit: 2022-08-01 | Discharge: 2022-08-01 | Disposition: A | Discharge status: Home / Self Care | Payer: BC Managed Care – PPO | Attending: Emergency Medicine | Admitting: Emergency Medicine

## 2022-08-01 ENCOUNTER — Other Ambulatory Visit: Payer: Self-pay

## 2022-08-01 DIAGNOSIS — J4551 Severe persistent asthma with (acute) exacerbation: Secondary | ICD-10-CM | POA: Diagnosis present

## 2022-08-01 DIAGNOSIS — J45901 Unspecified asthma with (acute) exacerbation: Secondary | ICD-10-CM | POA: Insufficient documentation

## 2022-08-01 DIAGNOSIS — Z20822 Contact with and (suspected) exposure to covid-19: Secondary | ICD-10-CM | POA: Insufficient documentation

## 2022-08-01 DIAGNOSIS — R000 Tachycardia, unspecified: Secondary | ICD-10-CM | POA: Diagnosis not present

## 2022-08-01 DIAGNOSIS — I2699 Other pulmonary embolism without acute cor pulmonale: Secondary | ICD-10-CM | POA: Diagnosis not present

## 2022-08-01 DIAGNOSIS — Z7952 Long term (current) use of systemic steroids: Secondary | ICD-10-CM | POA: Insufficient documentation

## 2022-08-01 DIAGNOSIS — R059 Cough, unspecified: Secondary | ICD-10-CM | POA: Diagnosis not present

## 2022-08-01 DIAGNOSIS — R0602 Shortness of breath: Secondary | ICD-10-CM | POA: Diagnosis not present

## 2022-08-01 DIAGNOSIS — R918 Other nonspecific abnormal finding of lung field: Secondary | ICD-10-CM | POA: Diagnosis not present

## 2022-08-01 LAB — CBC WITH DIFFERENTIAL/PLATELET
Abs Immature Granulocytes: 0.01 10*3/uL (ref 0.00–0.07)
Basophils Absolute: 0 10*3/uL (ref 0.0–0.1)
Basophils Relative: 0 %
Eosinophils Absolute: 0.1 10*3/uL (ref 0.0–0.5)
Eosinophils Relative: 1 %
HCT: 41 % (ref 36.0–46.0)
Hemoglobin: 14.6 g/dL (ref 12.0–15.0)
Immature Granulocytes: 0 %
Lymphocytes Relative: 8 %
Lymphs Abs: 0.7 10*3/uL (ref 0.7–4.0)
MCH: 27.4 pg (ref 26.0–34.0)
MCHC: 35.6 g/dL (ref 30.0–36.0)
MCV: 77.1 fL — ABNORMAL LOW (ref 80.0–100.0)
Monocytes Absolute: 0.1 10*3/uL (ref 0.1–1.0)
Monocytes Relative: 1 %
Neutro Abs: 7.6 10*3/uL (ref 1.7–7.7)
Neutrophils Relative %: 90 %
Platelets: 384 10*3/uL (ref 150–400)
RBC: 5.32 MIL/uL — ABNORMAL HIGH (ref 3.87–5.11)
RDW: 13.3 % (ref 11.5–15.5)
WBC: 8.6 10*3/uL (ref 4.0–10.5)
nRBC: 0 % (ref 0.0–0.2)

## 2022-08-01 LAB — PREGNANCY, URINE: Preg Test, Ur: NEGATIVE

## 2022-08-01 LAB — COMPREHENSIVE METABOLIC PANEL
ALT: 15 U/L (ref 0–44)
AST: 31 U/L (ref 15–41)
Albumin: 3.7 g/dL (ref 3.5–5.0)
Alkaline Phosphatase: 88 U/L (ref 38–126)
Anion gap: 12 (ref 5–15)
BUN: <5 mg/dL — ABNORMAL LOW (ref 6–20)
CO2: 21 mmol/L — ABNORMAL LOW (ref 22–32)
Calcium: 8.5 mg/dL — ABNORMAL LOW (ref 8.9–10.3)
Chloride: 105 mmol/L (ref 98–111)
Creatinine, Ser: 0.68 mg/dL (ref 0.44–1.00)
GFR, Estimated: >60 mL/min (ref >60)
Glucose, Bld: 154 mg/dL — ABNORMAL HIGH (ref 70–99)
Potassium: 3.3 mmol/L — ABNORMAL LOW (ref 3.5–5.1)
Sodium: 138 mmol/L (ref 135–145)
Total Bilirubin: 0.5 mg/dL (ref 0.3–1.2)
Total Protein: 7.3 g/dL (ref 6.5–8.1)

## 2022-08-01 LAB — RESP PANEL BY RT-PCR (FLU A&B, COVID) ARPGX2
Influenza A by PCR: NEGATIVE
Influenza B by PCR: NEGATIVE
SARS Coronavirus 2 by RT PCR: NEGATIVE

## 2022-08-01 LAB — TROPONIN I (HIGH SENSITIVITY)
Troponin I (High Sensitivity): <2 ng/L (ref <18)
Troponin I (High Sensitivity): <2 ng/L (ref <18)

## 2022-08-01 LAB — D-DIMER, QUANTITATIVE: D-Dimer, Quant: 1.1 ug/mL-FEU — ABNORMAL HIGH (ref 0.00–0.50)

## 2022-08-01 MED ORDER — ALBUTEROL SULFATE HFA 108 (90 BASE) MCG/ACT IN AERS
2.0000 | INHALATION_SPRAY | RESPIRATORY_TRACT | Status: AC
  Administered 2022-08-01: 2 via RESPIRATORY_TRACT
  Filled 2022-08-01: qty 6.7

## 2022-08-01 MED ORDER — IOHEXOL 350 MG/ML SOLN
100.0000 mL | Freq: Once | INTRAVENOUS | Status: AC | PRN
  Administered 2022-08-01: 100 mL via INTRAVENOUS

## 2022-08-01 MED ORDER — PREDNISONE 50 MG PO TABS
60.0000 mg | ORAL_TABLET | Freq: Once | ORAL | Status: AC
  Administered 2022-08-01: 60 mg via ORAL
  Filled 2022-08-01: qty 1

## 2022-08-01 MED ORDER — METHYLPREDNISOLONE SODIUM SUCC 125 MG IJ SOLR
125.0000 mg | Freq: Once | INTRAMUSCULAR | Status: AC
  Administered 2022-08-01: 125 mg via INTRAVENOUS
  Filled 2022-08-01: qty 2

## 2022-08-01 MED ORDER — ALBUTEROL SULFATE (2.5 MG/3ML) 0.083% IN NEBU
10.0000 mg | INHALATION_SOLUTION | Freq: Once | RESPIRATORY_TRACT | Status: AC
  Administered 2022-08-01: 10 mg via RESPIRATORY_TRACT
  Filled 2022-08-01: qty 12

## 2022-08-01 MED ORDER — ACETAMINOPHEN 325 MG PO TABS
650.0000 mg | ORAL_TABLET | Freq: Once | ORAL | Status: AC
  Administered 2022-08-01: 650 mg via ORAL
  Filled 2022-08-01: qty 2

## 2022-08-01 MED ORDER — IPRATROPIUM-ALBUTEROL 0.5-2.5 (3) MG/3ML IN SOLN
3.0000 mL | RESPIRATORY_TRACT | Status: AC
  Administered 2022-08-01 (×3): 3 mL via RESPIRATORY_TRACT
  Filled 2022-08-01: qty 9

## 2022-08-01 MED ORDER — MAGNESIUM SULFATE 2 GM/50ML IV SOLN
2.0000 g | Freq: Once | INTRAVENOUS | Status: AC
  Administered 2022-08-01: 2 g via INTRAVENOUS
  Filled 2022-08-01: qty 50

## 2022-08-01 MED ORDER — BENZONATATE 100 MG PO CAPS
100.0000 mg | ORAL_CAPSULE | Freq: Once | ORAL | Status: AC
  Administered 2022-08-01: 100 mg via ORAL
  Filled 2022-08-01: qty 1

## 2022-08-01 MED ORDER — PREDNISONE 20 MG PO TABS: 60.0000 mg | ORAL_TABLET | Freq: Every day | ORAL | 0 refills | Status: AC

## 2022-08-01 NOTE — ED Provider Notes (Signed)
[facility] EMERGENCY DEPARTMENT Provider Note   CSN: [id number] Arrival date & time: 08/01/22  0725     History  Chief Complaint  Patient presents with   Shortness of Breath    Julia Morgan is a 31 y.o. female.  HPI     31 yo female with history of asthma presents with concern for shortness of breath.  Reports has had cough and congestion for 3 days. Last night developed shortness of breath, wheezing. Inhaler would help for 15 minutes and then would be right back. Has not been hospitalized for asthma but kids have been. Chest has pain thinks from coughing so much. Some nasal congestion which has been clear.  Frequent coughing. No leg pain or swelling. No known fevers.   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 Authorizing Provider  predniSONE (DELTASONE) 20 MG tablet Take 3 tablets (60 mg total) by mouth daily for 5 days. 08/01/22 08/06/22 Yes [place], Adam, DO  sulfamethoxazole-trimethoprim (BACTRIM DS) 800-160 MG tablet Take 1 tablet by mouth 2 (two) times daily. 01/18/22   Rasch, Harolyn Rutherford, NP      Allergies    Patient has no known allergies.    Review of Systems   Review of Systems  Physical Exam Updated Vital Signs BP 108/66   Pulse (!) 102   Temp 99.1 F (37.3 C)   Resp 15   Ht 5\' 5"  (1.651 m)   Wt 61.7 kg   LMP 07/24/2022 (Approximate)   SpO2 99%   BMI 22.63 kg/m  Physical Exam Vitals and nursing note reviewed.  Constitutional:      General: She is not in acute distress.    Appearance: She is well-developed. She is not diaphoretic.  HENT:     Head: Normocephalic and atraumatic.  Eyes:     Conjunctiva/sclera:  Conjunctivae normal.  Cardiovascular:     Rate and Rhythm: Normal rate and regular rhythm.     Heart sounds: Normal heart sounds. No murmur heard.    No friction rub. No gallop.  Pulmonary:     Effort: Pulmonary effort is normal. Tachypnea (and frequent cough) present. No respiratory distress.     Breath sounds: Decreased breath sounds and wheezing present. No rales.  Abdominal:     General: There is no distension.     Palpations: Abdomen is soft.     Tenderness: There is no abdominal tenderness. There is no guarding.  Musculoskeletal:        General: No tenderness.     Cervical back: Normal range of motion.     Right lower leg: No edema.     Left lower leg: No edema.  Skin:    General: Skin is warm and dry.     Findings: No erythema or rash.  Neurological:     Mental Status: She is alert and oriented to person, place, and time.     ED Results / Procedures / Treatments   Labs (all labs ordered are listed, but only abnormal results are displayed) Labs Reviewed  CBC WITH DIFFERENTIAL/PLATELET -  Abnormal; Notable for the following components:      Result Value   RBC 5.32 (*)    MCV 77.1 (*)    All other components within normal limits  COMPREHENSIVE METABOLIC PANEL - Abnormal; Notable for the following components:   Potassium 3.3 (*)    CO2 21 (*)    Glucose, Bld 154 (*)    BUN <5 (*)    Calcium 8.5 (*)    All other components within normal limits  D-DIMER, QUANTITATIVE - Abnormal; Notable for the following components:   D-Dimer, Quant 1.10 (*)    All other components within normal limits  RESP PANEL BY RT-PCR (FLU A&B, COVID) ARPGX2  PREGNANCY, URINE  TROPONIN I (HIGH SENSITIVITY)  TROPONIN I (HIGH SENSITIVITY)    EKG EKG Interpretation  Date/Time:  Monday August 01 2022 08:11:40 EST Ventricular Rate:  90 PR Interval:  125 QRS Duration: 91 QT Interval:  349 QTC Calculation: 427 R Axis:   90 Text Interpretation: Sinus rhythm Biatrial enlargement Borderline  right axis deviation Borderline repolarization abnormality No significant change since last tracing Confirmed by Alvira Monday ([id number]) on 08/01/2022 9:56:45 AM  Radiology CT Angio Chest PE W and/or Wo Contrast  Result Date: 08/01/2022 CLINICAL DATA:  Pulmonary embolism (PE) suspected, high prob EXAM: CT ANGIOGRAPHY CHEST WITH CONTRAST TECHNIQUE: Multidetector CT imaging of the chest was performed using the standard protocol during bolus administration of intravenous contrast. Multiplanar CT image reconstructions and MIPs were obtained to evaluate the vascular anatomy. RADIATION DOSE REDUCTION: This exam was performed according to the departmental dose-optimization program which includes automated exposure control, adjustment of the mA and/or kV according to patient size and/or use of iterative reconstruction technique. CONTRAST:  80 mL Omnipaque 350non-ionic IV contrast. COMPARISON:  11/03/2017 FINDINGS: Cardiovascular: Satisfactory opacification of the pulmonary arteries to the segmental level. No evidence of pulmonary embolism. Normal heart size. No pericardial effusion. Mediastinum/Nodes: No enlarged mediastinal, hilar, or axillary lymph nodes. Thyroid gland, trachea, and esophagus demonstrate no significant findings. Lungs/Pleura: Lungs are clear. No pleural effusion or pneumothorax. Upper Abdomen: No acute abnormality. Musculoskeletal: No chest wall abnormality. No acute or significant osseous findings. Review of the MIP images confirms the above findings. IMPRESSION: No evidence of PE, aneurysm or dissection. Electronically Signed   By: Layla Maw M.D.   On: 08/01/2022 14:13   DG Chest Portable 1 View  Result Date: 08/01/2022 CLINICAL DATA:  Shortness of breath, cough EXAM: PORTABLE CHEST 1 VIEW COMPARISON:  Chest radiograph 04/25/2021 FINDINGS: The cardiomediastinal silhouette is normal There is no focal consolidation or pulmonary edema. There is no pleural effusion or pneumothorax There  is no acute osseous abnormality. IMPRESSION: No radiographic evidence of acute cardiopulmonary process. Electronically Signed   By: Lesia Hausen M.D.   On: 08/01/2022 08:00    Procedures Procedures    Medications Ordered in ED Medications  ipratropium-albuterol (DUONEB) 0.5-2.5 (3) MG/3ML nebulizer solution 3 mL (3 mLs Nebulization Given 08/01/22 0813)  predniSONE (DELTASONE) tablet 60 mg (60 mg Oral Given 08/01/22 0830)  albuterol (PROVENTIL) (2.5 MG/3ML) 0.083% nebulizer solution 10 mg (10 mg Nebulization Given 08/01/22 1046)  albuterol (PROVENTIL) (2.5 MG/3ML) 0.083% nebulizer solution 10 mg (10 mg Nebulization Given 08/01/22 1300)  magnesium sulfate IVPB 2 g 50 mL (0 g Intravenous Stopped 08/01/22 1430)  iohexol (OMNIPAQUE) 350 MG/ML injection 100 mL (100 mLs Intravenous Contrast Given 08/01/22 1408)  methylPREDNISolone sodium succinate (SOLU-MEDROL) 125 mg/2 mL injection 125 mg (125 mg Intravenous Given 08/01/22 1433)  benzonatate (TESSALON)  capsule 100 mg (100 mg Oral Given 08/01/22 1514)  acetaminophen (TYLENOL) tablet 650 mg (650 mg Oral Given 08/01/22 1653)  albuterol (VENTOLIN HFA) 108 (90 Base) MCG/ACT inhaler 2 puff (2 puffs Inhalation Given 08/01/22 1824)    ED Course/ Medical Decision Making/ A&P                             31 yo female with history of asthma presents with concern for shortness of breath.  Frequent cough, congestion, wheezing most consistent with viral URI with asthma exacerbation.  CXR completed and personally evaluated by me to evaluate for signs of pneumonia or pneumothorax shows no significant abnormalities.    EKG shows sinus tachycardia without other significant findings.  Given prednisone, duonebs x 3.  Low suspicion at this time for clinically significant anemia, metabolic abnormality, doubt ACS/PE/CHF.   Initially improved with duonebs, however return of dyspnea, tachypnea.  Ordered additional 1hr 10mg  albuterol.  Improved however symptoms  returned again with tachypnea. Ordered additional treatment, labs including ddimer.  Labs personally evaluated by me without signfiicant anemia or electrolyte abnormality, normal troponin, positive ddimer. Do not suspect ACS, myocarditis, CHF.  CT PE study done given positive ddimer shows no evidence of PE.  Given magnesium IV, plan on admit for asthma exacerbation. Will continue to monitor as awaiting bed.           Final Clinical Impression(s) / ED Diagnoses Final diagnoses:  Exacerbation of asthma, unspecified asthma severity, unspecified whether persistent    Rx / DC Orders ED Discharge Orders          Ordered    predniSONE (DELTASONE) 20 MG tablet  Daily        08/01/22 1802              08/03/22, MD 08/01/22 2223

## 2022-08-01 NOTE — ED Notes (Signed)
Pt ambulated to bathroom without distress.  Slight sob on exertion but tolerating well.

## 2022-08-01 NOTE — ED Triage Notes (Signed)
C/o cough x 3 days, been feeling short of breath & chest tightness. Hx of asthma, took albuterol at 0630 without relief. Has not used nebulizer.

## 2022-08-01 NOTE — ED Provider Notes (Signed)
Patient felt much improved after breathing treatments.  She would like to be discharged after being admitted earlier in the day for asthma exacerbation.  She has been ambulatory without any issues.  Has not been hypoxic.  Overall she appears well.  No increased work of breathing.  Will prescribe prednisone patient discharged in good condition.  She understands return precautions.  This chart was dictated using voice recognition software.  Despite best efforts to proofread,  errors can occur which can change the documentation meaning.    Virgina Norfolk, DO 08/01/22 1803

## 2022-08-01 NOTE — Discharge Instructions (Signed)
Take next dose of prednisone tomorrow.  Recommend using your inhaler with 4 puffs of albuterol every 4 hours for the next 24 hours and then as needed.

## 2022-08-01 NOTE — Plan of Care (Signed)
Plan of Care Note for accepted transfer   Patient: Julia Morgan MRN: [id number]   DOA: 08/01/2022  Facility requesting transfer: [facility] Requesting Provider: Alvira Monday, MD Reason for transfer: Asthma exacerbation Facility course: Prednisone, Solumedrol, Duonebs x3, magnesium sulfate and two hour long albuterol nebs given. Patient improved but concern she may not be quite back to baseline. CTA chest negative for acute illness/PE.  Plan of care: The patient is accepted for admission to Progressive unit, at [facility]. Plan to continue q4 hour nebulizer and steroid treatments.  Author: Jacquelin Hawking, MD 08/01/2022  Check [email or web address] for on-call coverage.  Nursing staff, Please call [facility] Admits & Consults System-Wide number on Amion as soon as patient's arrival, so appropriate admitting provider can evaluate the pt.

## 2022-08-01 NOTE — ED Notes (Signed)
Pt ambulated to bathroom without any respiratory distress.  Pt states she is feeling a little better.  Pt states her cough has decreased since medications.

## 2022-08-07 ENCOUNTER — Encounter (HOSPITAL_BASED_OUTPATIENT_CLINIC_OR_DEPARTMENT_OTHER): Payer: Self-pay | Admitting: Emergency Medicine

## 2022-08-07 ENCOUNTER — Other Ambulatory Visit: Payer: Self-pay

## 2022-08-07 ENCOUNTER — Emergency Department (HOSPITAL_BASED_OUTPATIENT_CLINIC_OR_DEPARTMENT_OTHER): Payer: BC Managed Care – PPO

## 2022-08-07 ENCOUNTER — Emergency Department (HOSPITAL_BASED_OUTPATIENT_CLINIC_OR_DEPARTMENT_OTHER)
Admission: EM | Admit: 2022-08-07 | Discharge: 2022-08-07 | Discharge status: Against Medical Advice | Payer: BC Managed Care – PPO | Attending: Emergency Medicine | Admitting: Emergency Medicine

## 2022-08-07 DIAGNOSIS — E876 Hypokalemia: Secondary | ICD-10-CM | POA: Diagnosis not present

## 2022-08-07 DIAGNOSIS — Z5329 Procedure and treatment not carried out because of patient's decision for other reasons: Secondary | ICD-10-CM | POA: Diagnosis not present

## 2022-08-07 DIAGNOSIS — J45909 Unspecified asthma, uncomplicated: Secondary | ICD-10-CM | POA: Diagnosis not present

## 2022-08-07 DIAGNOSIS — R051 Acute cough: Secondary | ICD-10-CM | POA: Diagnosis not present

## 2022-08-07 DIAGNOSIS — Z20822 Contact with and (suspected) exposure to covid-19: Secondary | ICD-10-CM | POA: Diagnosis not present

## 2022-08-07 DIAGNOSIS — R059 Cough, unspecified: Secondary | ICD-10-CM | POA: Diagnosis not present

## 2022-08-07 DIAGNOSIS — Z7951 Long term (current) use of inhaled steroids: Secondary | ICD-10-CM | POA: Diagnosis not present

## 2022-08-07 LAB — COMPREHENSIVE METABOLIC PANEL
ALT: 19 U/L (ref 0–44)
AST: 28 U/L (ref 15–41)
Albumin: 4.1 g/dL (ref 3.5–5.0)
Alkaline Phosphatase: 82 U/L (ref 38–126)
Anion gap: 12 (ref 5–15)
BUN: 9 mg/dL (ref 6–20)
CO2: 24 mmol/L (ref 22–32)
Calcium: 9.1 mg/dL (ref 8.9–10.3)
Chloride: 101 mmol/L (ref 98–111)
Creatinine, Ser: 0.82 mg/dL (ref 0.44–1.00)
GFR, Estimated: >60 mL/min (ref >60)
Glucose, Bld: 100 mg/dL — ABNORMAL HIGH (ref 70–99)
Potassium: 2.7 mmol/L — CL (ref 3.5–5.1)
Sodium: 137 mmol/L (ref 135–145)
Total Bilirubin: 1 mg/dL (ref 0.3–1.2)
Total Protein: 7.6 g/dL (ref 6.5–8.1)

## 2022-08-07 LAB — CBC WITH DIFFERENTIAL/PLATELET
Abs Immature Granulocytes: 0.03 10*3/uL (ref 0.00–0.07)
Basophils Absolute: 0.1 10*3/uL (ref 0.0–0.1)
Basophils Relative: 1 %
Eosinophils Absolute: 1.9 10*3/uL — ABNORMAL HIGH (ref 0.0–0.5)
Eosinophils Relative: 15 %
HCT: 39.9 % (ref 36.0–46.0)
Hemoglobin: 14.2 g/dL (ref 12.0–15.0)
Immature Granulocytes: 0 %
Lymphocytes Relative: 41 %
Lymphs Abs: 4.7 10*3/uL — ABNORMAL HIGH (ref 0.7–4.0)
MCH: 27.3 pg (ref 26.0–34.0)
MCHC: 35.6 g/dL (ref 30.0–36.0)
MCV: 76.7 fL — ABNORMAL LOW (ref 80.0–100.0)
Monocytes Absolute: 0.7 10*3/uL (ref 0.1–1.0)
Monocytes Relative: 5 %
Neutro Abs: 4.6 10*3/uL (ref 1.7–7.7)
Neutrophils Relative %: 38 %
Platelets: 437 10*3/uL — ABNORMAL HIGH (ref 150–400)
RBC: 5.2 MIL/uL — ABNORMAL HIGH (ref 3.87–5.11)
RDW: 12.7 % (ref 11.5–15.5)
WBC: 12 10*3/uL — ABNORMAL HIGH (ref 4.0–10.5)
nRBC: 0 % (ref 0.0–0.2)

## 2022-08-07 LAB — RESP PANEL BY RT-PCR (FLU A&B, COVID) ARPGX2
Influenza A by PCR: NEGATIVE
Influenza B by PCR: NEGATIVE
SARS Coronavirus 2 by RT PCR: NEGATIVE

## 2022-08-07 LAB — LACTIC ACID, PLASMA: Lactic Acid, Venous: 1.6 mmol/L (ref 0.5–1.9)

## 2022-08-07 LAB — MAGNESIUM: Magnesium: 1.8 mg/dL (ref 1.7–2.4)

## 2022-08-07 MED ORDER — DEXAMETHASONE SODIUM PHOSPHATE 10 MG/ML IJ SOLN
10.0000 mg | Freq: Once | INTRAMUSCULAR | Status: AC
  Administered 2022-08-07: 10 mg via INTRAVENOUS
  Filled 2022-08-07: qty 1

## 2022-08-07 MED ORDER — DM-GUAIFENESIN ER 30-600 MG PO TB12: 1.0000 | ORAL_TABLET | Freq: Once | ORAL | Status: DC

## 2022-08-07 MED ORDER — IPRATROPIUM-ALBUTEROL 0.5-2.5 (3) MG/3ML IN SOLN
3.0000 mL | RESPIRATORY_TRACT | Status: DC
  Administered 2022-08-07: 3 mL via RESPIRATORY_TRACT
  Filled 2022-08-07: qty 3

## 2022-08-07 MED ORDER — ACETAMINOPHEN 500 MG PO TABS
1000.0000 mg | ORAL_TABLET | Freq: Once | ORAL | Status: AC
  Administered 2022-08-07: 1000 mg via ORAL
  Filled 2022-08-07: qty 2

## 2022-08-07 MED ORDER — POTASSIUM CHLORIDE CRYS ER 20 MEQ PO TBCR
40.0000 meq | EXTENDED_RELEASE_TABLET | Freq: Once | ORAL | Status: AC
  Administered 2022-08-07: 40 meq via ORAL
  Filled 2022-08-07: qty 2

## 2022-08-07 MED ORDER — MENTHOL 3 MG MT LOZG: 1.0000 | LOZENGE | OROMUCOSAL | Status: DC | PRN

## 2022-08-07 MED ORDER — POTASSIUM CHLORIDE 10 MEQ/100ML IV SOLN
10.0000 meq | INTRAVENOUS | Status: DC
  Filled 2022-08-07: qty 100

## 2022-08-07 MED ORDER — BENZONATATE 100 MG PO CAPS
100.0000 mg | ORAL_CAPSULE | Freq: Once | ORAL | Status: AC
  Administered 2022-08-07: 100 mg via ORAL
  Filled 2022-08-07: qty 1

## 2022-08-07 MED ORDER — LACTATED RINGERS IV BOLUS
1000.0000 mL | Freq: Once | INTRAVENOUS | Status: AC
  Administered 2022-08-07: 1000 mL via INTRAVENOUS

## 2022-08-07 MED ORDER — ALBUTEROL SULFATE (2.5 MG/3ML) 0.083% IN NEBU
INHALATION_SOLUTION | RESPIRATORY_TRACT | Status: AC
  Administered 2022-08-07: 5 mg
  Filled 2022-08-07: qty 6

## 2022-08-07 NOTE — Discharge Instructions (Addendum)
Thank you for coming to [facility] Emergency Department. You were seen for cough. You are leaving against medical advice. We did an exam, labs, and imaging, and these showed low potassium level that was partially repleted here in the ED. You did not want to stay to have the level further repleted or be monitored. Please follow up with your primary care provider within 1 week to have your potassium level rechecked. For your cough, you can use tea/honey, over the counter mucinex, dextromethorphan, and fluticasone nasal spray.   Do not hesitate to return to the ED or call 911 if you experience: -Worsening symptoms -Shortness of breath -Muscle spasms, palpitations -Lightheadedness, passing out -Fevers/chills -Anything else that concerns you

## 2022-08-07 NOTE — ED Notes (Signed)
RT at bedside giving neb.

## 2022-08-07 NOTE — ED Provider Notes (Signed)
[facility] EMERGENCY DEPARTMENT Provider Note   CSN: [id number] Arrival date & time: 08/07/22  4196     History  Chief Complaint  Patient presents with   Cough    Julia Morgan is a 31 y.o. female with possible asthma presents with cough.   Per chart review patient was seen on 08/01/2022 here at [facility] for a likely asthma exacerbation.  She felt much improved after the breathing treatments and wanted to be discharged, left with prednisone Rx.  Patient states that she has continued to cough since then and that she does not and the prednisone is helping very much. She feels short of breath when she is coughing and states she is coughing continuously. No hemoptysis, no productive cough. Endorses central chest pain when she is coughing. No nausea/vomiting, LEE, abdominal pain, syncope. Endorses decreased PO intake because she is coughing so much. No fevers/chills but does endorse body aches.    Cough      Home Medications Prior to Admission medications   Medication Sig Start Date End Date Taking? Authorizing Provider  albuterol (VENTOLIN HFA) 108 (90 Base) MCG/ACT inhaler Inhale into the lungs every 6 (six) hours as needed for wheezing or shortness of breath.   Yes [provider]  sulfamethoxazole-trimethoprim (BACTRIM DS) 800-160 MG tablet Take 1 tablet by mouth 2 (two) times daily. 01/18/22   Rasch, Harolyn Rutherford, NP      Allergies    Patient has no known allergies.    Review of Systems   Review of Systems  Respiratory:  Positive for cough.    Review of systems Negative for f/c.  A 10 point review of systems was performed and is negative unless otherwise reported in HPI.  Physical Exam Updated Vital Signs BP 107/61   Pulse (!) 106   Resp 13   Ht 5\' 5"  (1.651 m)   Wt 56.8 kg   LMP 07/24/2022 (Approximate)   SpO2 96%   BMI 20.83 kg/m  Physical Exam General: Normal appearing female, lying in bed.  HEENT: PERRLA, Sclera anicteric, MMM,  trachea midline.  Cardiology: RRR, no murmurs/rubs/gallops. BL radial and DP pulses equal bilaterally.  Resp: Normal respiratory rate and effort. CTAB, no wheezes, rhonchi, crackles.  Abd: Soft, non-tender, non-distended. No rebound tenderness or guarding.  GU: Deferred. MSK: No peripheral edema or signs of trauma. Extremities without deformity or TTP. No cyanosis or clubbing. Skin: warm, dry. No rashes or lesions. Back: No CVA tenderness Neuro: A&Ox4, CNs II-XII grossly intact. MAEs. Sensation grossly intact.  Psych: Normal mood and affect.   ED Results / Procedures / Treatments   Labs (all labs ordered are listed, but only abnormal results are displayed) Labs Reviewed  CBC WITH DIFFERENTIAL/PLATELET - Abnormal; Notable for the following components:      Result Value   WBC 12.0 (*)    RBC 5.20 (*)    MCV 76.7 (*)    Platelets 437 (*)    Lymphs Abs 4.7 (*)    Eosinophils Absolute 1.9 (*)    All other components within normal limits  COMPREHENSIVE METABOLIC PANEL - Abnormal; Notable for the following components:   Potassium 2.7 (*)    Glucose, Bld 100 (*)    All other components within normal limits  RESP PANEL BY RT-PCR (FLU A&B, COVID) ARPGX2  LACTIC ACID, PLASMA  MAGNESIUM    EKG EKG Interpretation  Date/Time:  Sunday August 07 2022 07:57:28 EST Ventricular Rate:  93 PR Interval:  131  QRS Duration: 98 QT Interval:  410 QTC Calculation: 510 R Axis:   83 Text Interpretation: Age not entered, assumed to be  32 years old for purpose of ECG interpretation Sinus rhythm Biatrial enlargement Left ventricular hypertrophy Prolonged QT interval similar to prior No STEMI Confirmed by Theda Belfast ([id number]) on 08/08/2022 10:59:49 AM  Radiology   Procedures Procedures    Medications Ordered in ED Medications  albuterol (PROVENTIL) (2.5 MG/3ML) 0.083% nebulizer solution (5 mg  Given 08/07/22 0728)  lactated ringers bolus 1,000 mL (0 mLs Intravenous Stopped 08/07/22 0907)   benzonatate (TESSALON) capsule 100 mg (100 mg Oral Given 08/07/22 0826)  acetaminophen (TYLENOL) tablet 1,000 mg (1,000 mg Oral Given 08/07/22 0826)  dexamethasone (DECADRON) injection 10 mg (10 mg Intravenous Given 08/07/22 0837)  potassium chloride SA (KLOR-CON M) CR tablet 40 mEq (40 mEq Oral Given 08/07/22 1103)    ED Course/ Medical Decision Making/ A&P                          Medical Decision Making Amount and/or Complexity of Data Reviewed Labs: ordered. Decision-making details documented in ED Course. Radiology: ordered. Decision-making details documented in ED Course.  Risk OTC drugs. Prescription drug management.   This patient presents to the ED for concern of acute cough, this involves an extensive number of treatment options, and is a complaint that carries with it a high risk of complications and morbidity.  I considered the following differential and admission for this acute, potentially life threatening condition.   MDM:    For patient's cough, consider ongoing asthma exacerbation, viral upper respiratory infection vs bronchitis vs pneumonia, pulm edema/pleural effusion/PTX. Patient has no wheezing though, but could consider cough-dominant asthma. No abnormal lung sounds and patient is moving good air. Consider electrolyte abnormalities or renal injury d/t decreased PO intake. Patient is tachycardic and likely dehydrated so will give IV fluid resuscitation and reassess. She has no CP when not coughing and given the extent of the cough, likely to not represent ACS but will get EKG. Will treat with duonebs, tessalon perles, tylenol, decadron, IVF and reassess.   Clinical Course as of 08/17/22 1147  Sun Aug 07, 2022  1040 Potassium(!!): 2.7 Repleting PO and IV [HN]  1040 WBC(!): 12.0 [HN]  1040 DG Chest Port 1 View FINDINGS: Lung volumes are normal. No consolidative airspace disease. No pleural effusions. No pneumothorax. No pulmonary nodule or mass noted. Pulmonary  vasculature and the cardiomediastinal silhouette are within normal limits.  IMPRESSION: No radiographic evidence of acute cardiopulmonary disease.   [HN]    Clinical Course User Index [HN] Loetta Rough, MD     Labs: I Ordered, and personally interpreted labs.  The pertinent results include:  K 2.7, Na 137, Cr 0.82, lactate 1.6, WBC 12.0, Hgb 14.2, glucose 100, flu covid neg.   Imaging Studies ordered: I ordered imaging studies including CXR with no acute abnormalities I independently visualized and interpreted imaging. I agree with the radiologist interpretation  Additional history obtained from chart reivew.    Cardiac Monitoring: The patient was maintained on a cardiac monitor.  I personally viewed and interpreted the cardiac monitored which showed an underlying rhythm of: ST  Reevaluation: After the interventions noted above, I reevaluated the patient and found that they have :stayed the same  Social Determinants of Health: Patient lives independently   Disposition:  AMA  Discussed with patient her results. Hypokalemia likely d/t poor PO intake. Repleted orally  but haven't yet repleted IV. She states that her cough is not better and that she would like to go home. She states that none of our treatments are working and that she can just manage her symptoms at home. Her HR has improved with fluids but is not yet back to normal. Explained to patient that there are risks to leaving including worsening electrolyte abnormalities, worsening dehydration/tachycardia, possible morbidity/mortality. Patient reports understanding and will leave against medical advice. Instructed to stay hydrated, utilize over the counter cough remedies, and f/u with her PCP.   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   Mental disorder    Migraines    PONV (postoperative nausea and vomiting)      Medicines Meds ordered this encounter  Medications   albuterol (PROVENTIL) (2.5 MG/3ML) 0.083% nebulizer solution    Darolyn Rua M: cabinet override   lactated ringers bolus 1,000 mL   DISCONTD: ipratropium-albuterol (DUONEB) 0.5-2.5 (3) MG/3ML nebulizer solution 3 mL   DISCONTD: dextromethorphan-guaiFENesin (MUCINEX DM) 30-600 MG per 12 hr tablet 1 tablet   DISCONTD: menthol-cetylpyridinium (CEPACOL) lozenge 3 mg   benzonatate (TESSALON) capsule 100 mg   acetaminophen (TYLENOL) tablet 1,000 mg   dexamethasone (DECADRON) injection 10 mg   DISCONTD: potassium chloride 10 mEq in 100 mL IVPB   potassium chloride SA (KLOR-CON M) CR tablet 40 mEq    I have reviewed the patients home medicines and have made adjustments as needed  Problem List / ED Course: Problem List Items Addressed This Visit   None Visit Diagnoses     Acute cough    -  Primary   Hypokalemia                  Final Clinical Impression(s) / ED Diagnoses Final diagnoses:  Acute cough  Hypokalemia    Rx / DC Orders ED Discharge Orders     None        This note was created using dictation software, which may contain spelling or grammatical errors.    Loetta Rough, MD 08/17/22 [phone]

## 2022-08-07 NOTE — ED Triage Notes (Addendum)
States continues to have cough and meds that were prescribed on Tuesday here are not helping. Anxious, cont. Cough noted

## 2022-08-10 DIAGNOSIS — G43009 Migraine without aura, not intractable, without status migrainosus: Secondary | ICD-10-CM | POA: Diagnosis not present

## 2022-08-10 DIAGNOSIS — J4540 Moderate persistent asthma, uncomplicated: Secondary | ICD-10-CM | POA: Diagnosis not present

## 2022-08-10 DIAGNOSIS — R051 Acute cough: Secondary | ICD-10-CM | POA: Diagnosis not present

## 2022-09-14 DIAGNOSIS — R051 Acute cough: Secondary | ICD-10-CM | POA: Diagnosis not present

## 2022-09-14 DIAGNOSIS — J4540 Moderate persistent asthma, uncomplicated: Secondary | ICD-10-CM | POA: Diagnosis not present

## 2022-09-27 DIAGNOSIS — R051 Acute cough: Secondary | ICD-10-CM | POA: Diagnosis not present

## 2022-09-27 DIAGNOSIS — J4540 Moderate persistent asthma, uncomplicated: Secondary | ICD-10-CM | POA: Diagnosis not present

## 2022-09-28 ENCOUNTER — Other Ambulatory Visit (HOSPITAL_COMMUNITY)
Admission: RE | Admit: 2022-09-28 | Discharge: 2022-09-28 | Disposition: A | Discharge status: Home / Self Care | Payer: BC Managed Care – PPO | Source: Ambulatory Visit | Attending: Obstetrics and Gynecology | Admitting: Obstetrics and Gynecology

## 2022-09-28 ENCOUNTER — Ambulatory Visit (INDEPENDENT_AMBULATORY_CARE_PROVIDER_SITE_OTHER): Payer: BC Managed Care – PPO | Admitting: *Deleted

## 2022-09-28 VITALS — BP 119/76 | HR 85 | Resp 16 | Ht 65.0 in | Wt 137.0 lb

## 2022-09-28 DIAGNOSIS — Z113 Encounter for screening for infections with a predominantly sexual mode of transmission: Secondary | ICD-10-CM | POA: Insufficient documentation

## 2022-09-28 NOTE — Progress Notes (Signed)
Pt here for STI self swab and labwork.  Pt's partner has tested positive for Syphilis.  She states she hasn't slept with him in over a month but just wants testing.

## 2022-09-29 LAB — HEPATITIS C ANTIBODY: Hep C Virus Ab: NONREACTIVE

## 2022-09-29 LAB — CERVICOVAGINAL ANCILLARY ONLY
Bacterial Vaginitis (gardnerella): POSITIVE — AB
Candida Glabrata: NEGATIVE
Candida Vaginitis: NEGATIVE
Chlamydia: POSITIVE — AB
Comment: NEGATIVE
Comment: NEGATIVE
Comment: NEGATIVE
Comment: NEGATIVE
Comment: NEGATIVE
Comment: NORMAL
Neisseria Gonorrhea: NEGATIVE
Trichomonas: POSITIVE — AB

## 2022-09-29 LAB — HIV ANTIBODY (ROUTINE TESTING W REFLEX): HIV Screen 4th Generation wRfx: NONREACTIVE

## 2022-09-29 LAB — HEPATITIS B SURFACE ANTIGEN: Hepatitis B Surface Ag: NEGATIVE

## 2022-09-29 LAB — RPR: RPR Ser Ql: NONREACTIVE

## 2022-10-03 MED ORDER — DOXYCYCLINE HYCLATE 100 MG PO CAPS: 100.0000 mg | ORAL_CAPSULE | Freq: Two times a day (BID) | ORAL | 0 refills | Status: AC

## 2022-10-03 MED ORDER — METRONIDAZOLE 500 MG PO TABS: 500.0000 mg | ORAL_TABLET | Freq: Two times a day (BID) | ORAL | 0 refills | Status: AC

## 2022-10-03 NOTE — Addendum Note (Signed)
Addended by: Gale Journey on: 10/03/2022 09:08 AM   Modules accepted: Orders

## 2022-10-25 ENCOUNTER — Encounter: Payer: Self-pay | Admitting: Internal Medicine

## 2022-10-25 ENCOUNTER — Ambulatory Visit: Payer: BC Managed Care – PPO | Admitting: Internal Medicine

## 2022-10-25 VITALS — BP 112/80 | HR 123 | Temp 97.5°F | Resp 16 | Ht 66.0 in | Wt 128.3 lb

## 2022-10-25 DIAGNOSIS — J4551 Severe persistent asthma with (acute) exacerbation: Secondary | ICD-10-CM

## 2022-10-25 DIAGNOSIS — J189 Pneumonia, unspecified organism: Secondary | ICD-10-CM | POA: Diagnosis not present

## 2022-10-25 MED ORDER — METHYLPREDNISOLONE ACETATE 80 MG/ML IJ SUSP
80.0000 mg | Freq: Once | INTRAMUSCULAR | Status: AC
  Administered 2022-10-25: 80 mg via INTRAMUSCULAR

## 2022-10-25 MED ORDER — BREZTRI AEROSPHERE 160-9-4.8 MCG/ACT IN AERO: 1.0000 | INHALATION_SPRAY | Freq: Two times a day (BID) | RESPIRATORY_TRACT | 5 refills | Status: DC

## 2022-10-25 MED ORDER — IPRATROPIUM-ALBUTEROL 0.5-2.5 (3) MG/3ML IN SOLN: 3.0000 mL | RESPIRATORY_TRACT | Status: AC

## 2022-10-25 MED ORDER — ALBUTEROL SULFATE HFA 108 (90 BASE) MCG/ACT IN AERS: 2.0000 | INHALATION_SPRAY | RESPIRATORY_TRACT | 3 refills | Status: DC | PRN

## 2022-10-25 MED ORDER — DOXYCYCLINE MONOHYDRATE 100 MG PO TABS: 100.0000 mg | ORAL_TABLET | Freq: Two times a day (BID) | ORAL | 0 refills | Status: AC

## 2022-10-25 MED ORDER — PREDNISONE 10 MG PO TABS: ORAL_TABLET | ORAL | 0 refills | Status: DC

## 2022-10-25 NOTE — Patient Instructions (Signed)
Moderate Persistent asthma with acute exacerbation  - Duoneb treatment given in clinic  - Depo Medrol 77m IM given in clinic  -Take prednisone 486mdaily x 2 days, 3051maily x 2 days, 40m19mily x 2 days and 10mg83mly x 2 days. - Take Doxycycline 100mg 52me a day for 7 days  - Usue albuterol 4 puffs three times a day starting tonight for 72 hours, then 2 times a day for 48 hours, then 1 time a day for 48 hrs then move to as needed below   PLAN:  - Spacer sample and demonstration provided. - Controller Inhaler: Start Breztri 160mcg 11mffs twice a day; This Should Be Used Everyday - Rinse mouth out after use - Rescue Inhaler: Albuterol (Proair/Ventolin) 2 puffs . Use  every 4-6 hours as needed for chest tightness, wheezing, or coughing.  Can also use 15 minutes prior to exercise if you have symptoms with activity. - Asthma is not controlled if:  - Symptoms are occurring >2 times a week OR  - >2 times a month nighttime awakenings  - You are requiring systemic steroids (prednisone/steroid injections) more than once per year  - Your require hospitalization for your asthma.  - Please call the clinic to schedule a follow up if these symptoms arise   Follow up: 7 days   Thank you so much for letting me partake in your care today.  Don't hesitate to reach out if you have any additional concerns!  Oreatha Fabry [facility] [place]

## 2022-10-25 NOTE — Progress Notes (Signed)
New Patient Note  RE: Julia Morgan MRN: [id number] DOB: Nov 16, 1990 Date of Office Visit: 10/25/2022  Consult requested by: No ref. provider found Primary care provider: Patient, No Pcp Per  Chief Complaint: Cough (Pt is present today due to severe dry coughing,  pt states she have been hospitalized several times since last November. Pt states she been using albuterol. ) and Asthma  History of Present Illness: I had the pleasure of seeing Julia Morgan for initial evaluation at the [facility] on 10/25/2022. She is a 32 y.o. female, who is referred here by Patient, No Pcp Per for the evaluation of asthma  History obtained from patient, chart review.  Asthma History:  -Diagnosed at age as child, significantly worsened since November 2023. -Current symptoms include chest tightness, cough, shortness of breath, and wheezing, throughout the day.  She has lost weight as she cannot eat due to coughing.  She has to sleep upright pillows during coughing. -Today she reports she is very fatigued, short of breath with significant chest tightness. -Denies any lower extremity edema.  She did receive an ECG in the ER but has not had an echo. -She was seen in the ED in November and December, imaging summarized below.  Treated for asthma exacerbation with IM and oral steroids with some improvement. She saw her PCP-he was started on promethazine with codeine she reports this had the best benefit for her cough, however did not alleviate fully chest tightness and wheezing. -She was also transition from albuterol to air supra which she has been using 4 times a day on top of Breo which was started 1 month ago. 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  - 2 ED visits, 0 UC visits  and 2 oral steroids in the past year - 0 number of lifetime hospitalizations, 0 number of lifetime intubations.   Previous Diagnostics:  - Prior PFTs or spirometry: none to review - Most Recent AEC (03/18/22): 0 -Most Recent Chest Imaging: CXR on (08/07/22): No radiographic evidence of acute cardiopulmonary disease.  - CTPA 08/01/22: Cardiovascular: Satisfactory opacification of the pulmonary arteries to the segmental level. No evidence of pulmonary embolism. Normal heart size. No pericardial effusion.  Mediastinum/Nodes: No enlarged mediastinal, hilar, or axillary lymph nodes. Thyroid gland, trachea, and esophagus demonstrate no significant findings.  Lungs/Pleura: Lungs are clear. No pleural effusion or pneumothorax.  Upper Abdomen: No acute abnormality.  Musculoskeletal: No chest wall abnormality. No acute or significant osseous findings. - Today's Asthma Control Test:  .  4/25 Management:  - Previously used therapies: Breo 200 mcg.  - Current regimen:  - Maintenance: Breo 200 mg 1 puff daily, air supra multiple times a day - Rescue: Albuterol 2 puffs q4-6 hrs PRN, not exercising     Assessment and Plan: Julia Morgan is a 32 y.o. female with: Asthma, not well controlled, severe persistent, with acute exacerbation  Community acquired pneumonia of left lower lobe of lung  Patient presented today acutely ill and long-term management for asthma discussions were postponed in order to focus on acute exacerbation.  She was tachypneic, tachycardic, wheezing in left lower lobe with somewhat decreased respiratory effort.  She appeared very tired.  She has had some atypical response for management of asthma.  Differential include cardiac issues and will consider an echocardiogram in the future  if she does not respond with current plan.  Treated with nebulizer in clinic as well as IM steroid injection.  Stepped up to Breztri 160 mcg 2 puffs twice daily.  Will perform prolonged prednisone taper with  doxycycline for upper and lower respiratory bacterial infection coverage.  Schedule albuterol over the next few days.  Return precautions given to patient if she is not feeling better in the next 48 to 72 hours she needs to present to the ED.  Will have her come back in 1 week. Plan: Patient Instructions  Moderate Persistent asthma with acute exacerbation  - Duoneb treatment given in clinic  - Depo Medrol 29m IM given in clinic  -Take prednisone 435mdaily x 2 days, 3098maily x 2 days, 30m29mily x 2 days and 10mg42mly x 2 days. - Take Doxycycline 100mg 57me a day for 7 days  - Usue albuterol 4 puffs three times a day starting tonight for 72 hours, then 2 times a day for 48 hours, then 1 time a day for 48 hrs then move to as needed below   PLAN:  - Spacer sample and demonstration provided. - Controller Inhaler: Start Breztri 160mcg 87mffs twice a day; This Should Be Used Everyday - Rinse mouth out after use - Rescue Inhaler: Albuterol (Proair/Ventolin) 2 puffs . Use  every 4-6 hours as needed for chest tightness, wheezing, or coughing.  Can also use 15 minutes prior to exercise if you have symptoms with activity. - Asthma is not controlled if:  - Symptoms are occurring >2 times a week OR  - >2 times a month nighttime awakenings  - You are requiring systemic steroids (prednisone/steroid injections) more than once per year  - Your require hospitalization for your asthma.  - Please call the clinic to schedule a follow up if these symptoms arise   Follow up: 7 days   Thank you so much for letting me partake in your care today.  Don't hesitate to reach out if you have any additional concerns!  Kingston Guiles [facility] [place] [place]   Meds ordered this encounter  Medications   Budeson-Glycopyrrol-Formoterol (BREZTRI AEROSPHERE) 160-9-4.8 MCG/ACT AERO    Sig: Inhale 1 puff into the lungs 2 (two) times daily.    Dispense:  32.1 g    Refill:  5    methylPREDNISolone acetate (DEPO-MEDROL) injection 80 mg   albuterol (VENTOLIN HFA) 108 (90 Base) MCG/ACT inhaler    Sig: Inhale 2 puffs into the lungs every 4 (four) hours as needed for wheezing or shortness of breath.    Dispense:  18 g    Refill:  3   doxycycline (ADOXA) 100 MG tablet    Sig: Take 1 tablet (100 mg total) by mouth 2 (two) times daily for 7 days.    Dispense:  14 tablet    Refill:  0   predniSONE (DELTASONE) 10 MG tablet    Sig: Take prednisone 40mg da31mx 2 days, 30mg dai36m 2 days, 30mg dail47m2 days and 10mg daily98m days.    Dispense:  20 tablet    Refill:  0   Lab Orders  No laboratory test(s) ordered today    Other allergy screening: Asthma: yes Rhino conjunctivitis: yes Food allergy: no Medication allergy: no Hymenoptera allergy: no Urticaria: no Eczema:no History of recurrent infections suggestive of immunodeficency: no  Diagnostics: None done   Past Medical History: Patient Active Problem List  Diagnosis Date Noted   Community acquired pneumonia of left lower lobe of lung 10/25/2022   Asthma, not well controlled, severe persistent, with acute exacerbation 08/01/2022   Gonorrhea 01/18/2022   Nasal fracture 04/27/2021   IUD (intrauterine device) in place 01/07/2018   Depression affecting pregnancy, antepartum 11/03/2017   Women's annual routine gynecological examination 07/14/2017   Past Medical History:  Diagnosis Date   Anemia    Anxiety    Asthma    Back pain    Depression    not post partum but taking zoloft currently   HSV-2 infection    never had outbreak   Mental disorder    Migraines    PONV (postoperative nausea and vomiting)    Past Surgical History: Past Surgical History:  Procedure Laterality Date   NO PAST SURGERIES     Medication List:  Current Outpatient Medications  Medication Sig Dispense Refill   albuterol (VENTOLIN HFA) 108 (90 Base) MCG/ACT inhaler Inhale 2 puffs into the lungs every 4 (four) hours as  needed for wheezing or shortness of breath. 18 g 3   Budeson-Glycopyrrol-Formoterol (BREZTRI AEROSPHERE) 160-9-4.8 MCG/ACT AERO Inhale 1 puff into the lungs 2 (two) times daily. 32.1 g 5   doxycycline (ADOXA) 100 MG tablet Take 1 tablet (100 mg total) by mouth 2 (two) times daily for 7 days. 14 tablet 0   montelukast (SINGULAIR) 10 MG tablet Take 10 mg by mouth daily.     predniSONE (DELTASONE) 10 MG tablet Take prednisone 61m daily x 2 days, 384mdaily x 2 days, 2067maily x 2 days and 30m64mily x 2 days. 20 tablet 0   promethazine-dextromethorphan (PROMETHAZINE-DM) 6.25-15 MG/5ML syrup 5 mL orally every 6 hours     sulfamethoxazole-trimethoprim (BACTRIM DS) 800-160 MG tablet Take 1 tablet by mouth 2 (two) times daily. (Patient not taking: Reported on 10/25/2022) 14 tablet 0   Current Facility-Administered Medications  Medication Dose Route Frequency Provider Last Rate Last Admin   PARAGARD INTRAUTERINE COPPER IUD   Intrauterine Once SmitTamala JulianrgVermontM       Allergies: No Known Allergies Social History: Social History   Socioeconomic History   Marital status: Single    Spouse name: Not on file   Number of children: Not on file   Years of education: Not on file   Highest education level: Not on file  Occupational History   Occupation: trainer  Tobacco Use   Smoking status: Former    Passive exposure: Past   Smokeless tobacco: Never  Vaping Use   Vaping Use: Former  Substance and Sexual Activity   Alcohol use: Yes    Comment: socially, but can't anymore   Drug use: Not Currently    Types: Marijuana   Sexual activity: Yes    Partners: Female    Birth control/protection: I.U.D.  Other Topics Concern   Not on file  Social History Narrative   Not on file   Social Determinants of Health   Financial Resource Strain: Not on file  Food Insecurity: Not on file  Transportation Needs: Not on file  Physical Activity: Not on file  Stress: Not on file  Social Connections: Not  on file   Lives in a single-family home, there are roaches in the house and bed is 2 feet off the floor.  There are dust mite precautions on bed but not pillows.  Not exposed to fumes, chemicals or dust.  There is a HEPA filter in the home and home is not  near an interstate industrial area. Smoking: No exposure  Occupation: Designer, fashion/clothing HistoryFreight forwarder in the house: no Carpet in the family room: no Carpet in the bedroom: no Heating:  Oil Cooling: central Pet: yes cat without access to bedroom  Family History: Family History  Problem Relation Age of Onset   Hyperlipidemia Mother    Hypertension Mother    Hyperlipidemia Maternal Grandmother    Hypertension Maternal Grandmother    Cancer - Cervical Paternal Grandmother      ROS: All others negative except as noted per HPI.   Objective: BP 112/80   Pulse (!) 123   Temp (!) 97.5 F (36.4 C) (Temporal)   Resp 16   Ht 5' 6"$  (1.676 m)   Wt 128 lb 4.8 oz (58.2 kg)   LMP 09/26/2022   SpO2 95%   BMI 20.71 kg/m  Body mass index is 20.71 kg/m.  General Appearance:  Tired., cooperative, appears stated age  Head:  Normocephalic, without obvious abnormality, atraumatic  Eyes:  Conjunctiva clear, EOM's intact  Nose: Nares normal,   Throat: Lips, tongue normal; teeth and gums normal,   Neck: Supple, symmetrical, no JVD  Lungs:   Tachypnea, able to complete short sentences, , decreased air movement, wheezing left lower lobe intermittent dry coughing  Heart:  Tachycardic and no murmur, Appears well perfused  Extremities: No edema  Skin: Skin color, texture, turgor normal, no rashes or lesions on visualized portions of skin  Neurologic: No gross deficits   The plan was reviewed with the patient/family, and all questions/concerned were addressed.  It was my pleasure to see Julia Morgan today and participate in her care. Please feel free to contact me with any questions or  concerns.  Sincerely,  Roney Marion, MD Allergy & Immunology  [facility] office: [phone] [facility] office: [phone]

## 2022-11-01 ENCOUNTER — Ambulatory Visit: Payer: BC Managed Care – PPO | Admitting: Internal Medicine

## 2022-11-01 VITALS — BP 104/64 | HR 71 | Temp 97.6°F | Resp 12

## 2022-11-01 DIAGNOSIS — Z8701 Personal history of pneumonia (recurrent): Secondary | ICD-10-CM | POA: Diagnosis not present

## 2022-11-01 DIAGNOSIS — J4540 Moderate persistent asthma, uncomplicated: Secondary | ICD-10-CM | POA: Diagnosis not present

## 2022-11-01 NOTE — Patient Instructions (Addendum)
Moderate Persistent asthma: much improved  Breathing tests today showed: Normal pattern Allergy Testing today: negative to all environmentals  PLAN:  - Spacer sample and demonstration provided. - Controller Inhaler: Start Breztri 139mg 2 puffs twice a day; This Should Be Used Everyday - Rinse mouth out after use - Rescue Inhaler: Albuterol (Proair/Ventolin) 2 puffs . Use  every 4-6 hours as needed for chest tightness, wheezing, or coughing.  Can also use 15 minutes prior to exercise if you have symptoms with activity. - Asthma is not controlled if:  - Symptoms are occurring >2 times a week OR  - >2 times a month nighttime awakenings  - You are requiring systemic steroids (prednisone/steroid injections) more than once per year  - Your require hospitalization for your asthma.  - Please call the clinic to schedule a follow up if these symptoms arise   Follow up: 4 weeks   Thank you so much for letting me partake in your care today.  Don't hesitate to reach out if you have any additional concerns!  ERoney Marion MD  [facility] [place]

## 2022-11-01 NOTE — Progress Notes (Signed)
Follow Up Note  RE: Julia Morgan MRN: [id number] DOB: [birth year] Date of Office Visit: 11/01/2022  Referring provider: No ref. provider found Primary care provider: Patient, No Pcp Per  Chief Complaint: Asthma (Doing better)  History of Present Illness: I had the pleasure of seeing Julia Morgan for a follow up visit at the [facility] on 11/01/2022. She is a 32 y.o. female, who is being followed for moderate persistent asthma. Her previous allergy office visit was on 10/25/22 with Dr. Edison Pace. Today is a regular follow up visit.  History obtained from patient, chart review.  At last visit she had uncontrolled asthma and was treated with duoneb, doxycyline, prednisone, IM depo and stepped up from [place] to [place].  Julia Morgan and skin testing not performed due to symptoms   Today she reports:  Significant improvement in dyspnea, cough, wheeze since last visit. She reports compliance with Breztri 2 puffs twice daily, washing her mouth out after use After tapering off her albuterol she has not needed any of her rescue inhaler She is nervous as she usually flares after stopping prednisone. No changes to health history no adverse effects of medications.   Previous Diagnostics:  - Most Recent AEC (03/18/22): 0 -Most Recent Chest Imaging: CXR on (08/07/22): No radiographic evidence of acute cardiopulmonary disease.  - CTPA 08/01/22: Cardiovascular: Satisfactory opacification of the pulmonary arteries to the segmental level. No evidence of pulmonary embolism. Normal heart size. No pericardial effusion.  Mediastinum/Nodes: No enlarged mediastinal, hilar, or axillary lymph nodes. Thyroid gland, trachea, and esophagus demonstrate no significant findings.  Lungs/Pleura: Lungs are clear. No pleural effusion or pneumothorax.  Upper Abdomen: No acute abnormality.  Musculoskeletal: No chest wall abnormality. No acute or significant osseous findings.   Asthma:  - 2 ED visits, 0  UC visits and 2 oral steroids in the past year - 0 number of lifetime hospitalizations, 0 number of lifetime intubations - - Most Recent AEC (03/18/22): 0 - SPT 11/01/22: negative to all environmentals  - Normal Spirometry (11/01/22): FEV1 2.19L, 79% predicted  Assessment and Plan: Julia Morgan is a 32 y.o. female with: History of pneumonia  Moderate persistent asthma without complication - Plan: Spirometry with Graph, Allergy Test   Plan: Patient Instructions  Moderate Persistent asthma: much improved  Breathing tests today showed: Normal pattern Allergy Testing today: negative to all environmentals  PLAN:  - Spacer sample and demonstration provided. - Controller Inhaler: Start Breztri 150mg 2 puffs twice a day; This Should Be Used Everyday - Rinse mouth out after use - Rescue Inhaler: Albuterol (Proair/Ventolin) 2 puffs . Use  every 4-6 hours as needed for chest tightness, wheezing, or coughing.  Can also use 15 minutes prior to exercise if you have symptoms with activity. - Asthma is not controlled if:  - Symptoms are occurring >2 times a week OR  - >2 times a month nighttime awakenings  - You are requiring systemic steroids (prednisone/steroid injections) more than once per year  - Your require hospitalization for your asthma.  - Please call the clinic to schedule a follow up if these symptoms arise   Follow up: 4 weeks   Thank you so much for letting me partake in your care today.  Don't hesitate to reach out if you have any additional concerns!  ERoney [place] MD  [facility] [place] [place]   No orders of the defined types were placed in this encounter.   Lab Orders  No laboratory test(s) ordered  today   Diagnostics: Spirometry:  Tracings reviewed. Her effort: Good reproducible efforts. FVC: 2.73 L FEV1: 2.19L, 79% predicted FEV1/FVC ratio: 85% Interpretation: Spirometry consistent with normal pattern.  Please see scanned spirometry results for  details.  Skin Testing: Environmental allergy panel. Epicutaneous and intradermal testing was negative to all environmentals, epicutaneous testing negative to Top ten foods Results interpreted by myself during this encounter and discussed with patient/family.  Airborne Adult Perc - 11/01/22 0906     Time Antigen Placed [id number]    Allergen Manufacturer Lavella Hammock    Location Back    Number of Test 58    1. Control-Buffer 50% Glycerol Negative    2. Control-Histamine 1 mg/ml 3+    3. Albumin saline Negative    4. P[place] Negative    5. [place] Negative    6. Johnson Negative    7. Pineland Blue Negative    9. Perennial Rye Negative    10. Sweet Vernal Negative    11. Timothy Negative    12. Cocklebur Negative    13. Burweed Marshelder Negative    14. Ragweed, short Negative    15. Ragweed, Giant Negative    16. Plantain,  English Negative    17. Lamb's Quarters Negative    18. Sheep Sorrell Negative    19. Rough Pigweed Negative    20. Marsh Elder, Rough Negative    21. Mugwort, Common Negative    22. Ash mix Negative    23. Birch mix Negative    24. Beech American Negative    25. Box, Elder Negative    26. Cedar, red Negative    27. Cottonwood, [place] Negative    28. Elm mix Negative    29. Hickory Negative    30. Maple mix Negative    31. Oak, [place] mix Negative    32. Pecan Pollen Negative    33. Pine mix Negative    34. Sycamore Eastern Negative    35. Att[place], Black Pollen Negative    36. Alternaria alternata Negative    37. Cladosporium Herbarum Negative    38. Aspergillus mix Negative    39. Penicillium mix Negative    40. Bipolaris sorokiniana (Helminthosporium) Negative    41. Drechslera spicifera (Curvularia) Negative    42. Mucor plumbeus Negative    43. Fusarium moniliforme Negative    44. Aureobasidium pullulans (pullulara) Negative    45. Rhizopus oryzae Negative    46. Botrytis cinera Negative    47. Epicoccum nigrum Negative    48. Phoma betae Negative     49. Candida Albicans Negative    50. Trichophyton mentagrophytes Negative    51. Mite, D Farinae  5,000 AU/ml Negative    52. Mite, D Pteronyssinus  5,000 AU/ml Negative    53. Cat Hair 10,000 BAU/ml Negative    54.  Dog Epithelia Negative    55. Mixed Feathers Negative    56. Horse Epithelia Negative    57. Cockroach, German Negative    58. Mouse Negative    59. Tobacco Leaf Negative             Food Perc - 11/01/22 0906       Test Information   Time Antigen Placed [id number]    Allergen Manufacturer Lavella Hammock    Location Back    Number of allergen test 10      Food   1. Peanut Negative    2. Soybean food Negative    3. Wheat, whole Negative  4. Sesame Negative    5. Milk, cow Negative    6. Egg White, chicken Negative    7. Casein Negative    8. Shellfish mix Negative    9. Fish mix Negative    10. Cashew Negative      Comments   Comments n             Medication List:  Current Outpatient Medications  Medication Sig Dispense Refill   albuterol (VENTOLIN HFA) 108 (90 Base) MCG/ACT inhaler Inhale 2 puffs into the lungs every 4 (four) hours as needed for wheezing or shortness of breath. 18 g 3   doxycycline (ADOXA) 100 MG tablet Take 1 tablet (100 mg total) by mouth 2 (two) times daily for 7 days. 14 tablet 0   montelukast (SINGULAIR) 10 MG tablet Take 10 mg by mouth daily.     Budeson-Glycopyrrol-Formoterol (BREZTRI AEROSPHERE) 160-9-4.8 MCG/ACT AERO Inhale 1 puff into the lungs 2 (two) times daily. 32.1 g 5   guaiFENesin-codeine 100-10 MG/5ML syrup SMARTSIG:5-10 Milliliter(s) By Mouth Twice Daily PRN (Patient not taking: Reported on 11/01/2022)     predniSONE (DELTASONE) 10 MG tablet Take prednisone '40mg'$  daily x 2 days, '30mg'$  daily x 2 days, '20mg'$  daily x 2 days and '10mg'$  daily x 2 days. (Patient not taking: Reported on 11/01/2022) 20 tablet 0   promethazine-dextromethorphan (PROMETHAZINE-DM) 6.25-15 MG/5ML syrup 5 mL orally every 6 hours (Patient not taking: Reported  on 11/01/2022)     Current Facility-Administered Medications  Medication Dose Route Frequency Provider Last Rate Last Admin   [place] IUD   Intrauterine Once [place], [place], CNM       Allergies: No Known Allergies I reviewed her past medical history, social history, family history, and environmental history and no significant changes have been reported from her previous visit.  ROS: All others negative except as noted per HPI.   Objective: BP 104/64   Pulse 71   Temp 97.6 F (36.4 C) (Temporal)   Resp 12   SpO2 98%  There is no height or weight on file to calculate BMI. General Appearance:  Alert, cooperative, no distress, appears stated age  Head:  Normocephalic, without obvious abnormality, atraumatic  Eyes:  Conjunctiva clear, EOM's intact  Nose: Nares normal,  erythematous nasal mucosa, no visible anterior polyps, and septum midline  Throat: Lips, tongue normal; teeth and gums normal, normal posterior oropharynx  Neck: Supple, symmetrical  Lungs:   clear to auscultation bilaterally, Respirations unlabored, no coughing  Heart:  regular rate and rhythm and no murmur, Appears well perfused  Extremities: No edema  Skin: Skin color, texture, turgor normal, no rashes or lesions on visualized portions of skin  Neurologic: No gross deficits   Previous notes and tests were reviewed. The plan was reviewed with the patient/family, and all questions/concerned were addressed.  It was my pleasure to see Julia Morgan today and participate in her care. Please feel free to contact me with any questions or concerns.  Sincerely,  Roney [place], MD  Allergy & Immunology  [facility] Office: [phone]

## 2022-11-15 ENCOUNTER — Telehealth: Payer: Self-pay

## 2022-11-15 MED ORDER — BREZTRI AEROSPHERE 160-9-4.8 MCG/ACT IN AERO: 1.0000 | INHALATION_SPRAY | Freq: Two times a day (BID) | RESPIRATORY_TRACT | 1 refills | Status: DC

## 2022-11-15 NOTE — Telephone Encounter (Signed)
Patient calling needing her breztri filled bc it got put back when it was sent in.  Patient was given a sample in our office.

## 2022-11-15 NOTE — Telephone Encounter (Signed)
It appears Gerre Pebbles CMA has sent in breztri refill for pt

## 2022-11-29 ENCOUNTER — Ambulatory Visit: Payer: BC Managed Care – PPO | Admitting: Internal Medicine

## 2022-11-29 DIAGNOSIS — J309 Allergic rhinitis, unspecified: Secondary | ICD-10-CM

## 2023-03-20 ENCOUNTER — Other Ambulatory Visit: Payer: Self-pay

## 2023-03-20 MED ORDER — ALBUTEROL SULFATE HFA 108 (90 BASE) MCG/ACT IN AERS: 2.0000 | INHALATION_SPRAY | RESPIRATORY_TRACT | 3 refills | Status: DC | PRN

## 2023-03-21 ENCOUNTER — Encounter: Payer: Self-pay | Admitting: Internal Medicine

## 2023-03-21 ENCOUNTER — Ambulatory Visit (INDEPENDENT_AMBULATORY_CARE_PROVIDER_SITE_OTHER): Payer: BC Managed Care – PPO | Admitting: Internal Medicine

## 2023-03-21 VITALS — BP 114/80 | HR 107 | Temp 97.7°F | Resp 20 | Wt 132.2 lb

## 2023-03-21 DIAGNOSIS — Z8701 Personal history of pneumonia (recurrent): Secondary | ICD-10-CM

## 2023-03-21 DIAGNOSIS — J4551 Severe persistent asthma with (acute) exacerbation: Secondary | ICD-10-CM | POA: Diagnosis not present

## 2023-03-21 MED ORDER — METHYLPREDNISOLONE ACETATE 80 MG/ML IJ SUSP
80.0000 mg | Freq: Once | INTRAMUSCULAR | Status: AC
  Administered 2023-03-21: 80 mg via INTRAMUSCULAR

## 2023-03-21 NOTE — Patient Instructions (Addendum)
Severe  Persistent asthma with acute exacerbation  - Duoneb treatment given in clinic  - Depo Medrol 80mg  IM given in clinic  -Take prednisone 40mg  daily x 2 days, 30mg  daily x 2 days, 20mg  daily x 2 days and 10mg  daily x 2 days. - Usue albuterol 4 puffs three times a day starting tonight for 72 hours, then 2 times a day for 48 hours, then 1 time a day for 48 hrs then move to as needed below   PLAN:  - Spacer sample and demonstration provided. - Controller Inhaler: Start Breztri 2 puffs twice a day; This Should Be Used Everyday - Rinse mouth out after use - Rescue Inhaler: Albuterol (Proair/Ventolin) 2 puffs . Use  every 4-6 hours as needed for chest tightness, wheezing, or coughing.  Can also use 15 minutes prior to exercise if you have symptoms with activity. - Asthma is not controlled if:  - Symptoms are occurring >2 times a week OR  - >2 times a month nighttime awakenings  - You are requiring systemic steroids (prednisone/steroid injections) more than once per year  - Your require hospitalization for your asthma.  - Please call the clinic to schedule a follow up if these symptoms arise   Follow up: 7 days   Thank you so much for letting me partake in your care today.  Don't hesitate to reach out if you have any additional concerns!  Ferol Luz, MD  [facility] [place] [place]

## 2023-03-21 NOTE — Progress Notes (Signed)
Follow Up Note  RE: Julia Morgan MRN: [id number] DOB: 1991/07/23 Date of Office Visit: 03/21/2023  Referring provider: No ref. provider found Primary care provider: Patient, No Pcp Per  Chief Complaint: Follow-up and Asthma  History of Present Illness: I had the pleasure of seeing Julia Morgan for a follow up visit at the [facility] on 03/26/2023. She is a 32 y.o. female, who is being followed for moderate persistent asthma. Her previous allergy office visit was on 11/01/22 with Dr. Marlynn Perking. Today is a regular follow up visit.  History obtained from patient, chart review.  At last visit allergy testing was negative to environmentals and she was continued on [place]. FEV1: 2.19L, 79% predicted  Today she reports:  Since last visit she ran out of her Breztri 1 week ago.  Over the past week she has had increased dyspnea, chest tightness, cough.  Using her albuterol more than every 4 hours.  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  This is why she ran out.  When she is on her Breztri asthma is well-controlled.  She has not had any ER visits or antibiotics or steroids since last visit.  She has a new job and has new insurance and is here to reestablish care.  No fevers, increased rhinorrhea or postnasal drip.  No sinus tenderness.   No changes to health history no adverse effects of medications.   Previous Diagnostics:  -Most Recent Chest Imaging: CXR on (08/07/22): No radiographic evidence of acute cardiopulmonary disease.  - CTPA 08/01/22: Normal CTPA   Asthma:  - 2 ED visits, 0 UC visits and 3 oral steroids in the past year - 0 number of lifetime hospitalizations, 0 number of lifetime intubations - - Most Recent AEC (03/18/22): 0 - SPT 11/01/22: negative to all environmentals  - Normal Spirometry (11/01/22): FEV1 2.19L, 79% predicted  Assessment and Plan: Julia Morgan is a 32 y.o. female with: Asthma, not well controlled,  severe persistent, with acute exacerbation  History of pneumonia   Plan: Patient Instructions  Severe  Persistent asthma with acute exacerbation  - Duoneb treatment given in clinic  - Depo Medrol 80mg  IM given in clinic  -Take prednisone 40mg  daily x 2 days, 30mg  daily x 2 days, 20mg  daily x 2 days and 10mg  daily x 2 days. - Usue albuterol 4 puffs three times a day starting tonight for 72 hours, then 2 times a day for 48 hours, then 1 time a day for 48 hrs then move to as needed below   PLAN:  - Spacer sample and demonstration provided. - Controller Inhaler: Start Breztri 2 puffs twice a day; This Should Be Used Everyday - Rinse mouth out after use - Rescue Inhaler: Albuterol (Proair/Ventolin) 2 puffs . Use  every 4-6 hours as needed for chest tightness, wheezing, or coughing.  Can also use 15 minutes prior to exercise if you have symptoms with activity. - Asthma is not controlled if:  - Symptoms are occurring >2 times a week OR  - >2 times a month nighttime awakenings  - You are requiring systemic steroids (prednisone/steroid injections) more than once per year  - Your require hospitalization for your asthma.  - Please call the clinic to schedule a follow up if these symptoms arise   Follow up: 7 days   Thank you so much for letting me partake in your care today.  Don't hesitate to reach out if  you have any additional concerns!  Ferol Luz, MD  [facility] [place] [place]   Meds ordered this encounter  Medications   albuterol (VENTOLIN HFA) 108 (90 Base) MCG/ACT inhaler    Sig: Inhale 2 puffs into the lungs every 4 (four) hours as needed for wheezing or shortness of breath.    Dispense:  17 each    Refill:  3   montelukast (SINGULAIR) 10 MG tablet    Sig: Take 1 tablet (10 mg total) by mouth daily.    Dispense:  30 tablet    Refill:  5   ipratropium (ATROVENT) nebulizer solution 0.5 mg   predniSONE (DELTASONE) 10 MG tablet    Sig: Take  prednisone 40mg  daily x 2 days, 30mg  daily x 2 days, 20mg  daily x 2 days and 10mg  daily x 2 days.    Dispense:  20 tablet    Refill:  0   Fluticasone-Umeclidin-Vilant (TRELEGY ELLIPTA) 200-62.5-25 MCG/ACT AEPB    Sig: Inhale 1 puff into the lungs daily.    Dispense:  1 each    Refill:  5   methylPREDNISolone acetate (DEPO-MEDROL) injection 80 mg    Lab Orders  No laboratory test(s) ordered today   Diagnostics: None done      Medication List:  Current Outpatient Medications  Medication Sig Dispense Refill   Budeson-Glycopyrrol-Formoterol (BREZTRI AEROSPHERE) 160-9-4.8 MCG/ACT AERO Inhale 1 puff into the lungs 2 (two) times daily. 32.1 each 1   Fluticasone-Umeclidin-Vilant (TRELEGY ELLIPTA) 200-62.5-25 MCG/ACT AEPB Inhale 1 puff into the lungs daily. 1 each 5   albuterol (VENTOLIN HFA) 108 (90 Base) MCG/ACT inhaler Inhale 2 puffs into the lungs every 4 (four) hours as needed for wheezing or shortness of breath. 17 each 3   guaiFENesin-codeine 100-10 MG/5ML syrup SMARTSIG:5-10 Milliliter(s) By Mouth Twice Daily PRN (Patient not taking: Reported on 11/01/2022)     montelukast (SINGULAIR) 10 MG tablet Take 1 tablet (10 mg total) by mouth daily. 30 tablet 5   predniSONE (DELTASONE) 10 MG tablet Take prednisone 40mg  daily x 2 days, 30mg  daily x 2 days, 20mg  daily x 2 days and 10mg  daily x 2 days. 20 tablet 0   promethazine-dextromethorphan (PROMETHAZINE-DM) 6.25-15 MG/5ML syrup  (Patient not taking: Reported on 03/21/2023)     Current Facility-Administered Medications  Medication Dose Route Frequency Provider Last Rate Last Admin   ipratropium (ATROVENT) nebulizer solution 0.5 mg  0.5 mg Nebulization Once        PARAGARD INTRAUTERINE COPPER IUD   Intrauterine Once [place], [place], CNM       Allergies: No Known Allergies I reviewed her past medical history, social history, family history, and environmental history and no significant changes have been reported from her previous  visit.  ROS: All others negative except as noted per HPI.   Objective: BP 114/80   Pulse (!) 107   Temp 97.7 F (36.5 C) (Temporal)   Resp 20   Wt 132 lb 3.2 oz (60 kg)   SpO2 100%   BMI 21.34 kg/m  Body mass index is 21.34 kg/m. General Appearance:  Alert, cooperative, no distress, appears stated age  Head:  Normocephalic, without obvious abnormality, atraumatic  Eyes:  Conjunctiva clear, EOM's intact  Nose: Nares normal, normal mucosa, no visible anterior polyps, and septum midline  Throat: Lips, tongue normal; teeth and gums normal, normal posterior oropharynx  Neck: Supple, symmetrical  Lungs:   Tachypneic and end-expiratory wheezing, Respirations unlabored, intermittent dry coughing  Heart:  regular rate  and rhythm and no murmur, Appears well perfused  Extremities: No edema  Skin: Skin color, texture, turgor normal, no rashes or lesions on visualized portions of skin  Neurologic: No gross deficits   Previous notes and tests were reviewed. The plan was reviewed with the patient/family, and all questions/concerned were addressed.  It was my pleasure to see Astaria today and participate in her care. Please feel free to contact me with any questions or concerns.  Sincerely,  Ferol Luz, MD  Allergy & Immunology  [facility] Office: [phone]

## 2023-03-26 MED ORDER — PREDNISONE 10 MG PO TABS: ORAL_TABLET | ORAL | 0 refills | Status: DC

## 2023-03-26 MED ORDER — TRELEGY ELLIPTA 200-62.5-25 MCG/ACT IN AEPB: 1.0000 | INHALATION_SPRAY | Freq: Every day | RESPIRATORY_TRACT | 5 refills | Status: DC

## 2023-03-26 MED ORDER — ALBUTEROL SULFATE HFA 108 (90 BASE) MCG/ACT IN AERS: 2.0000 | INHALATION_SPRAY | RESPIRATORY_TRACT | 3 refills | Status: DC | PRN

## 2023-03-26 MED ORDER — MONTELUKAST SODIUM 10 MG PO TABS: 10.0000 mg | ORAL_TABLET | Freq: Every day | ORAL | 5 refills | Status: AC

## 2023-03-26 MED ORDER — IPRATROPIUM BROMIDE 0.02 % IN SOLN: 0.5000 mg | Freq: Once | RESPIRATORY_TRACT | Status: AC

## 2023-03-28 ENCOUNTER — Ambulatory Visit: Payer: BC Managed Care – PPO | Admitting: Internal Medicine

## 2023-03-28 DIAGNOSIS — J309 Allergic rhinitis, unspecified: Secondary | ICD-10-CM

## 2023-04-03 ENCOUNTER — Ambulatory Visit (INDEPENDENT_AMBULATORY_CARE_PROVIDER_SITE_OTHER): Payer: BC Managed Care – PPO | Admitting: Internal Medicine

## 2023-04-03 DIAGNOSIS — Z8701 Personal history of pneumonia (recurrent): Secondary | ICD-10-CM

## 2023-04-03 DIAGNOSIS — J4550 Severe persistent asthma, uncomplicated: Secondary | ICD-10-CM

## 2023-04-03 NOTE — Patient Instructions (Signed)
Severe  Persistent asthma: improved from acute flare    PLAN:  - Spacer use reviewed. - Controller Inhaler: Continue Breztri 2 puffs twice a day; This Should Be Used Everyday - Rinse mouth out after use - Rescue Inhaler: Albuterol (Proair/Ventolin) 2 puffs . Use  every 4-6 hours as needed for chest tightness, wheezing, or coughing.  Can also use 15 minutes prior to exercise if you have symptoms with activity. - Asthma is not controlled if:  - Symptoms are occurring >2 times a week OR  - >2 times a month nighttime awakenings  - You are requiring systemic steroids (prednisone/steroid injections) more than once per year  - Your require hospitalization for your asthma.  - Please call the clinic to schedule a follow up if these symptoms arise   Follow up: 4 months   Thank you so much for letting me partake in your care today.  Don't hesitate to reach out if you have any additional concerns!  Ferol Luz, MD  [facility] [place] [place]

## 2023-04-03 NOTE — Progress Notes (Unsigned)
RE: Julia Morgan MRN: [id number] DOB: 02/02/1991 Date of Telemedicine Visit: 04/03/2023  Referring provider: No ref. provider found Primary care provider: Patient, No Pcp Per  Chief Complaint: Asthma (Breathing has gotten better. )   Telemedicine Follow Up Visit via Telephone: I connected with Julia Morgan for a follow up on 04/04/23 by telephone and verified that I am speaking with the correct person using two identifiers.   I discussed the limitations, risks, security and privacy concerns of performing an evaluation and management service by telephone and the availability of in person appointments. I also discussed with the patient that there may be a patient responsible charge related to this service. The patient expressed understanding and agreed to proceed.  Patient is at home Provider is at the office.  Visit start time: 10:45  Visit end time: 11:05  Insurance consent/check in by: Julia Morgan  Medical consent and medical assistant/nurse: Julia Morgan  History of Present Illness: She is a 32 y.o. female, who is being followed for severe persistent asthma. Her previous allergy office visit was on  03/21/23 with  Dr. Marlynn Perking .  At last visit she was treated with acute asthma flare due to insurance lapsing and losing access to breztri.  Received duoneb in clinic, IM depo medrol and OCS.   Previous Diagnostics:  - Most Recent AEC (03/18/22): 0 -Most Recent Chest Imaging: CXR on (08/07/22): No radiographic evidence of acute cardiopulmonary disease.  - CTPA 08/01/22: Cardiovascular:No evidence of pulmonary embolism. Normal heart size. No pericardial effusion. Mediastinum/Nodes: No enlarged mediastinal, hilar, or axillary lymph nodes. Thyroid gland, trachea, and esophagus demonstrate no significant findings.  Lungs/Pleura: Lungs are clear. No pleural effusion or pneumothorax. Upper Abdomen: No acute abnormality. Musculoskeletal: No chest wall abnormality. No acute or significant osseous  findings.    Asthma:  - 2 ED visits, 0 UC visits and 3 oral steroids in the past year - 0 number of lifetime hospitalizations, 0 number of lifetime intubations - - Most Recent AEC (03/18/22): 0 - SPT 11/01/22: negative to all environmentals  - Normal Spirometry (11/01/22): FEV1 2.19L, 79% predicted  Today she reports complete resolution of symptoms with prednisone and restarting Breztri.  Reports compliance with Breztri 2 puffs twice daily.  She only needed albuterol for approximately 24 hours after steroid injection and prednisone.  She would like to give Julia Morgan a chance prior to starting Biologics as  she felt like she was controlled when she is taking it regularly. No adverse effects of medications.     Assessment and Plan: Julia Morgan is a 32 y.o. female with: Severe  Persistent asthma: improved from acute flare    PLAN:  - Spacer use reviewed. - Controller Inhaler: Continue Breztri 2 puffs twice a day; This Should Be Used Everyday - Rinse mouth out after use - Rescue Inhaler: Albuterol (Proair/Ventolin) 2 puffs . Use  every 4-6 hours as needed for chest tightness, wheezing, or coughing.  Can also use 15 minutes prior to exercise if you have symptoms with activity. - Asthma is not controlled if:  - Symptoms are occurring >2 times a week OR  - >2 times a month nighttime awakenings  - You are requiring systemic steroids (prednisone/steroid injections) more than once per year  - Your require hospitalization for your asthma.  - Please call the clinic to schedule a follow up if these symptoms arise   Follow up: 4 months   Thank you so much for letting me partake in your care today.  Don't hesitate to  reach out if you have any additional concerns!  Julia Luz, MD  [facility] [place] [place]  Lab Orders  No laboratory test(s) ordered today    Diagnostics: None.  Medication List:  Current Outpatient Medications  Medication Sig Dispense Refill    albuterol (VENTOLIN HFA) 108 (90 Base) MCG/ACT inhaler Inhale 2 puffs into the lungs every 4 (four) hours as needed for wheezing or shortness of breath. 17 each 3   Budeson-Glycopyrrol-Formoterol (BREZTRI AEROSPHERE) 160-9-4.8 MCG/ACT AERO Inhale 1 puff into the lungs 2 (two) times daily. 32.1 each 1   Fluticasone-Umeclidin-Vilant (TRELEGY ELLIPTA) 200-62.5-25 MCG/ACT AEPB Inhale 1 puff into the lungs daily. 1 each 5   guaiFENesin-codeine 100-10 MG/5ML syrup      montelukast (SINGULAIR) 10 MG tablet Take 1 tablet (10 mg total) by mouth daily. 30 tablet 5   Current Facility-Administered Medications  Medication Dose Route Frequency Provider Last Rate Last Admin   ipratropium (ATROVENT) nebulizer solution 0.5 mg  0.5 mg Nebulization Once        PARAGARD INTRAUTERINE COPPER IUD   Intrauterine Once [place], [place], CNM       Allergies: No Known Allergies I reviewed her past medical history, social history, family history, and environmental history and no significant changes have been reported from previous visit on 03/21/23.  Review of Systems  All other systems reviewed and are negative.  Objective:  Not obtained as encounter was done via telehealth.   Previous notes and tests were reviewed.  I discussed the assessment and treatment plan with the patient. The patient was provided an opportunity to ask questions and all were answered. The patient agreed with the plan and demonstrated an understanding of the instructions.   The patient was advised to call back or seek an in-person evaluation if the symptoms worsen or if the condition fails to improve as anticipated.  I provided 5 minutes of non-face-to-face time during this encounter.  It was my pleasure to participate in [place] care today. Please feel free to contact me with any questions or concerns.   Sincerely,  Julia Luz, MD

## 2023-05-23 ENCOUNTER — Telehealth: Payer: Self-pay | Admitting: Internal Medicine

## 2023-05-23 MED ORDER — BREZTRI AEROSPHERE 160-9-4.8 MCG/ACT IN AERO: 1.0000 | INHALATION_SPRAY | Freq: Two times a day (BID) | RESPIRATORY_TRACT | 0 refills | Status: DC

## 2023-05-23 NOTE — Telephone Encounter (Signed)
Patient called requesting a refill on medication Budeson-Glycopyrrol-Formoterol (BREZTRI AEROSPHERE) 160-9-4.8 MCG/ACT AERO [[id number]] please advise

## 2023-05-23 NOTE — Telephone Encounter (Signed)
Sent in rx and informed pt of me doing so also verified pharmacy with pt

## 2023-06-05 ENCOUNTER — Other Ambulatory Visit (HOSPITAL_BASED_OUTPATIENT_CLINIC_OR_DEPARTMENT_OTHER): Payer: Self-pay

## 2023-06-05 ENCOUNTER — Emergency Department (HOSPITAL_BASED_OUTPATIENT_CLINIC_OR_DEPARTMENT_OTHER)
Admission: EM | Admit: 2023-06-05 | Discharge: 2023-06-05 | Disposition: A | Discharge status: Home / Self Care | Payer: BC Managed Care – PPO | Attending: Emergency Medicine | Admitting: Emergency Medicine

## 2023-06-05 ENCOUNTER — Encounter (HOSPITAL_BASED_OUTPATIENT_CLINIC_OR_DEPARTMENT_OTHER): Payer: Self-pay

## 2023-06-05 DIAGNOSIS — L0291 Cutaneous abscess, unspecified: Secondary | ICD-10-CM

## 2023-06-05 DIAGNOSIS — L02215 Cutaneous abscess of perineum: Secondary | ICD-10-CM | POA: Diagnosis present

## 2023-06-05 MED ORDER — IBUPROFEN 800 MG PO TABS
800.0000 mg | ORAL_TABLET | Freq: Three times a day (TID) | ORAL | 0 refills | Status: DC
  Filled 2023-06-05: qty 21, 7d supply, fill #0

## 2023-06-05 MED ORDER — CLINDAMYCIN HCL 300 MG PO CAPS
300.0000 mg | ORAL_CAPSULE | Freq: Three times a day (TID) | ORAL | 0 refills | Status: AC
  Filled 2023-06-05: qty 21, 7d supply, fill #0

## 2023-06-05 MED ORDER — LIDOCAINE-EPINEPHRINE (PF) 2 %-1:200000 IJ SOLN
10.0000 mL | Freq: Once | INTRAMUSCULAR | Status: AC
  Administered 2023-06-05: 10 mL
  Filled 2023-06-05: qty 20

## 2023-06-05 MED ORDER — PENTAFLUOROPROP-TETRAFLUOROETH EX AERO
INHALATION_SPRAY | Freq: Once | CUTANEOUS | Status: AC
  Administered 2023-06-05: 30 via TOPICAL
  Filled 2023-06-05: qty 30

## 2023-06-05 NOTE — Discharge Instructions (Addendum)
1.  Follow-up with your gynecologist in 2 days to recheck your abscess.  It may require repeat packing for continued drainage to heal as it is large and has areas that are firm as well. 2.  Return to Emergency Department immediately if you get a fever, redness and swelling are enlarging or other concerning changes. 2.  Leave your wound covered with clean gauze.  It should continue to drain around the packing.

## 2023-06-05 NOTE — ED Triage Notes (Signed)
Pt states that she has a boil in her pelvic region. Noticed it about a week ago.

## 2023-06-05 NOTE — ED Provider Notes (Signed)
[place] [facility] AT [facility] Provider Note   CSN: [id number] Arrival date & time: 06/05/23  4098     History  Chief Complaint  Patient presents with   Abscess    Julia Morgan is a 32 y.o. female.  HPI Patient where she started getting a swollen tender spot on her pubic area about a week ago.  She is been trying to do hot compresses to promote drainage.  Nonetheless is gotten increasingly large and exquisitely painful.  And now hurts when her thigh presses against it for walking.  No fever no chill no constitutional symptoms.  She reports he has had prior abscesses that required drainage.    Home Medications Prior to Admission medications   Medication Sig Start Date End Date Taking? Authorizing Provider  clindamycin (CLEOCIN) 300 MG capsule Take 1 capsule (300 mg total) by mouth 3 (three) times daily. 06/05/23  Yes Arby Barrette, MD  albuterol (VENTOLIN HFA) 108 (90 Base) MCG/ACT inhaler Inhale 2 puffs into the lungs every 4 (four) hours as needed for wheezing or shortness of breath. Patient not taking: Reported on 06/08/2023 03/26/23   Ferol Luz, MD  Budeson-Glycopyrrol-Formoterol (BREZTRI AEROSPHERE) 160-9-4.8 MCG/ACT AERO Inhale 1 puff into the lungs 2 (two) times daily. Patient not taking: Reported on 06/08/2023 05/23/23   Ferol Luz, MD  doxycycline (VIBRAMYCIN) 100 MG capsule Take 1 capsule (100 mg total) by mouth 2 (two) times daily. 06/08/23   Lennart Pall, MD  ibuprofen (ADVIL) 800 MG tablet Take 1 tablet (800 mg total) by mouth every 8 (eight) hours. 06/08/23   Lennart Pall, MD  montelukast (SINGULAIR) 10 MG tablet Take 1 tablet (10 mg total) by mouth daily. Patient not taking: Reported on 06/08/2023 03/26/23   Ferol Luz, MD  oxyCODONE (ROXICODONE) 5 MG immediate release tablet Take 1 tablet (5 mg total) by mouth every 4 (four) hours as needed for severe pain. 06/08/23   Lennart Pall, MD      Allergies     Patient has no known allergies.    Review of Systems   Review of Systems  Physical Exam Updated Vital Signs BP 126/80 (BP Location: Right Arm)   Pulse 78   Temp 97.6 F (36.4 C) (Oral)   Resp 18   Ht 5\' 6"  (1.676 m)   Wt 59 kg   LMP 04/30/2023 (Exact Date)   SpO2 100%   BMI 20.98 kg/m  Physical Exam Constitutional:      Comments: Well-nourished well-developed.  Alert nontoxic.  Pulmonary:     Effort: Pulmonary effort is normal.  Abdominal:     General: There is no distension.     Palpations: Abdomen is soft.     Tenderness: There is no abdominal tenderness.  Genitourinary:    Comments: Patient has a large swollen nodule in the hairbearing area of the mons pubis to the left and above the level of the labia.  Exquisitely tender fluctuant swelling about 5 cm with slight extension down the labia majora on the left. Skin:    General: Skin is warm and dry.  Neurological:     General: No focal deficit present.     Mental Status: She is oriented to person, place, and time.  Psychiatric:        Mood and Affect: Mood normal.     ED Results / Procedures / Treatments   Labs (all labs ordered are listed, but only abnormal results are displayed) Labs Reviewed - No data  to display  EKG None  Radiology No results found.  Procedures .Marland KitchenIncision and Drainage  Date/Time: 06/05/2023 10:43 AM  Performed by: Arby Barrette, MD Authorized by: Arby Barrette, MD   Consent:    Consent obtained:  Verbal   Consent given by:  Patient   Risks discussed:  Bleeding, damage to other organs, infection, incomplete drainage and pain Location:    Type:  Abscess   Size:  Mons pubis 5cm   Location:  Anogenital Pre-procedure details:    Skin preparation:  Povidone-iodine Anesthesia:    Anesthesia method:  Topical application and local infiltration   Local anesthetic:  Lidocaine 2% WITH epi Procedure type:    Complexity:  Simple Procedure details:    Ultrasound guidance: no      Incision types:  Single straight   Incision depth:  Subcutaneous   Wound management:  Probed and deloculated   Drainage:  Purulent   Drainage amount:  Copious   Packing materials:  1/4 in iodoform gauze   Amount 1/4" iodoform:  10cm Post-procedure details:    Procedure completion:  Tolerated well, no immediate complications     Medications Ordered in ED Medications  lidocaine-EPINEPHrine (XYLOCAINE W/EPI) 2 %-1:200000 (PF) injection 10 mL (10 mLs Infiltration Given by Other 06/05/23 0925)  pentafluoroprop-tetrafluoroeth (GEBAUERS) aerosol (30 Applications Topical Given 06/05/23 0924)    ED Course/ Medical Decision Making/ A&P                                 Medical Decision Making Risk Prescription drug management.   Patient presents with an abscess on hairbearing area of the mons pubis.  She has had prior abscesses but reports this 1 is much larger and more painful than any previous ones.  This is confined to the mons with a little bit of extension to the left labia.  No crepitus.  With incision and drainage there is copious amount of purulent drainage.  With palpation there is some residual indurated areas.  Will continue the patient oral antibiotics.  Wound is packed.  Patient is instructed on 48-hour follow-up and return precautions to the emergency department.  She voices understanding.        Final Clinical Impression(s) / ED Diagnoses Final diagnoses:  Abscess    Rx / DC Orders ED Discharge Orders          Ordered    clindamycin (CLEOCIN) 300 MG capsule  3 times daily        06/05/23 1042    ibuprofen (ADVIL) 800 MG tablet  3 times daily,   Status:  Discontinued        06/05/23 1042              Arby Barrette, MD 06/09/23 1703

## 2023-06-08 ENCOUNTER — Ambulatory Visit: Payer: BC Managed Care – PPO | Admitting: Obstetrics and Gynecology

## 2023-06-08 ENCOUNTER — Encounter: Payer: Self-pay | Admitting: Obstetrics and Gynecology

## 2023-06-08 VITALS — BP 127/81 | HR 88 | Ht 66.0 in | Wt 137.0 lb

## 2023-06-08 DIAGNOSIS — N764 Abscess of vulva: Secondary | ICD-10-CM

## 2023-06-08 DIAGNOSIS — L732 Hidradenitis suppurativa: Secondary | ICD-10-CM | POA: Diagnosis not present

## 2023-06-08 MED ORDER — IBUPROFEN 800 MG PO TABS: 800.0000 mg | ORAL_TABLET | Freq: Three times a day (TID) | ORAL | 0 refills | Status: AC

## 2023-06-08 MED ORDER — OXYCODONE HCL 5 MG PO TABS
5.0000 mg | ORAL_TABLET | ORAL | 0 refills | Status: AC | PRN
Start: 2023-06-08 — End: ?

## 2023-06-08 MED ORDER — DOXYCYCLINE HYCLATE 100 MG PO CAPS
100.0000 mg | ORAL_CAPSULE | Freq: Two times a day (BID) | ORAL | 2 refills | Status: AC
Start: 2023-06-08 — End: ?

## 2023-06-08 NOTE — Progress Notes (Signed)
   RETURN GYNECOLOGY VISIT  Subjective:  Julia Morgan is a 32 y.o. [id number] with LMP 04/30/23 presenting for ED follow up of vulvar abscess.   ED note not available for review today.   Pt reports she was seen on Monday for a bump that was draining purulent fluid. Report severe pain when walking. Tried ibuprofen with limited relief so isn't really taking pain medication. No fevers, chills, nausea or vomiting. It was packed and she presents today for packing change.   Reports that she has had a lot of bumps/lesions on her vulvar area and is wondering if she has HS. She does not have any axillary lesions. Has been going on for the past 2 years.   Objective:   Vitals:   06/08/23 0839  BP: 127/81  Pulse: 88  Weight: 137 lb (62.1 kg)  Height: 5\' 6"  (1.676 m)   General:  Alert, oriented and cooperative. Patient is in no acute distress.  Skin: Skin is warm and dry. No rash noted.   Cardiovascular: Normal heart rate noted  Respiratory: Normal respiratory effort, no problems with respiration noted  Abdomen: Soft, non-tender, non-distended   Pelvic: Tunnels and scarring on right buttocks/perineum with inflamed and no spontaneous drainage. Additional tunnel with scarring and inflamed nodule on left labia majora without spontaneous drainage. No fluctuance, no e/o cellulitis or abscess  0.5cm opening on the left lateral portion of the mons pubis. Hurricaine spray applied and packing removed intact. Area probed. Tracks inferio-laterally 2cm. Irrigated and repacked with 1/4in iodoform packing.   Exam performed in the presence of a chaperone  Assessment and Plan:  Julia Morgan is a 32 y.o. with likely hidradenitis suppuritiva s/p recent I&D of a lesion on the mons pubis presenting for wound care.   Vulvar abscess This is most likely an exacerbation of HS Packing change done today - pt had significant pain and does not think she can pack herself over the weekend Will repack on Monday Small  opioid rx for severe breakthrough pain on top of scheduled ibuprofen.  Discussed that she can bring oxycodone to her appointment on Monday to take when she arrives for premedication before packing change. She is aware that she needs someone to drive her home from her appointment after taking oxycodone. Continue abx rx from ED Discussed when to present to ED -     oxyCODONE (ROXICODONE) 5 MG immediate release tablet; Take 1 tablet (5 mg total) by mouth every 4 (four) hours as needed for severe pain. -     ibuprofen (ADVIL) 800 MG tablet; Take 1 tablet (800 mg total) by mouth every 8 (eight) hours.  Hidradenitis suppurativa Lesions today c/w stage II HS. Reviewed diagnosis with the patient that this is not related to poor hygiene but is a chronic inflammatory condition.  When pt has completed abx from ED, she will start treatment for HS with doxycycline Derm referral sent -     Ambulatory referral to Dermatology -     doxycycline (VIBRAMYCIN) 100 MG capsule; Take 1 capsule (100 mg total) by mouth 2 (two) times daily.  Return in 4 days (on 06/12/2023) for packing change.  Future Appointments  Date Time Provider Department Center  06/12/2023  8:10 AM Lesly Dukes, MD [facility]    Lennart Pall, MD

## 2023-06-12 ENCOUNTER — Encounter: Payer: Self-pay | Admitting: Obstetrics & Gynecology

## 2023-06-12 ENCOUNTER — Ambulatory Visit: Payer: BC Managed Care – PPO | Admitting: Obstetrics & Gynecology

## 2023-06-12 VITALS — BP 127/81 | HR 88 | Ht 66.0 in | Wt 137.0 lb

## 2023-06-12 DIAGNOSIS — Z8619 Personal history of other infectious and parasitic diseases: Secondary | ICD-10-CM | POA: Diagnosis not present

## 2023-06-12 DIAGNOSIS — N764 Abscess of vulva: Secondary | ICD-10-CM | POA: Diagnosis not present

## 2023-06-12 DIAGNOSIS — L732 Hidradenitis suppurativa: Secondary | ICD-10-CM | POA: Diagnosis not present

## 2023-06-12 NOTE — Progress Notes (Signed)
Subjective:    Patient ID: Julia Morgan, female    DOB: February 11, 1991, 32 y.o.   MRN: [id number]  HPI  Zaila Nylander is a 32 y.o. [id number] with LMP 04/30/23 presenting for ED follow up of vulvar abscess and subsequent visit in our office on 06/08/23.  Pt dx with HS, placed on Doxy and referral made to dermatology.  She also finished of Keflex from urgent care.  Doxy made her stomach upset this morning--did not take with food.   Review of Systems  Constitutional: Negative.   Respiratory: Negative.    Cardiovascular: Negative.   Gastrointestinal:  Positive for abdominal pain.       Took Doxy this morning  Genitourinary:        Pain on mons--better with antibiotics       Objective:   Physical Exam Vitals reviewed.  Constitutional:      General: She is not in acute distress.    Appearance: She is well-developed.  HENT:     Head: Normocephalic and atraumatic.  Eyes:     Conjunctiva/sclera: Conjunctivae normal.  Cardiovascular:     Rate and Rhythm: Normal rate.  Pulmonary:     Effort: Pulmonary effort is normal.  Skin:    General: Skin is warm and dry.  Neurological:     Mental Status: She is alert and oriented to person, place, and time.  Psychiatric:        Mood and Affect: Mood normal.   Vitals:   06/12/23 0810  BP: 127/81  Pulse: 88  Weight: 137 lb (62.1 kg)  Height: 5\' 6"  (1.676 m)      Assessment & Plan:  32 yo female with mons pubis abscess and HS Asked our receptionist to call the dermatology office to ensure she is getting an appointment Continue Doxy twice a day. The incision is too small for packing at this time.  Hydrogen peroxide was used to will out the small tract.  Patient will keep an eye on induration and signs of worsening infection. Wash daily with antibiotic soap. Area clean and dry.

## 2023-09-22 ENCOUNTER — Ambulatory Visit: Payer: BC Managed Care – PPO | Admitting: Allergy

## 2023-09-22 ENCOUNTER — Encounter: Payer: Self-pay | Admitting: Internal Medicine

## 2023-09-22 ENCOUNTER — Ambulatory Visit (INDEPENDENT_AMBULATORY_CARE_PROVIDER_SITE_OTHER): Payer: BC Managed Care – PPO | Admitting: Internal Medicine

## 2023-09-22 VITALS — BP 116/78 | HR 94 | Temp 97.5°F | Resp 19 | Wt 134.8 lb

## 2023-09-22 DIAGNOSIS — J4550 Severe persistent asthma, uncomplicated: Secondary | ICD-10-CM | POA: Diagnosis not present

## 2023-09-22 MED ORDER — PREDNISONE 20 MG PO TABS: 20.0000 mg | ORAL_TABLET | Freq: Every day | ORAL | 0 refills | Status: DC

## 2023-09-22 MED ORDER — PREDNISONE 20 MG PO TABS: 40.0000 mg | ORAL_TABLET | Freq: Every day | ORAL | 0 refills | Status: AC

## 2023-09-22 MED ORDER — BREZTRI AEROSPHERE 160-9-4.8 MCG/ACT IN AERO: 1.0000 | INHALATION_SPRAY | Freq: Two times a day (BID) | RESPIRATORY_TRACT | 0 refills | Status: AC

## 2023-09-22 MED ORDER — IPRATROPIUM BROMIDE 0.02 % IN SOLN
0.5000 mg | Freq: Once | RESPIRATORY_TRACT | Status: DC
Start: 2023-09-22 — End: 2023-09-22

## 2023-09-22 MED ORDER — ALBUTEROL SULFATE HFA 108 (90 BASE) MCG/ACT IN AERS: 2.0000 | INHALATION_SPRAY | RESPIRATORY_TRACT | 3 refills | Status: AC | PRN

## 2023-09-22 MED ORDER — IPRATROPIUM-ALBUTEROL 0.5-2.5 (3) MG/3ML IN SOLN
3.0000 mL | Freq: Once | RESPIRATORY_TRACT | Status: AC
Start: 2023-09-22 — End: ?

## 2023-09-22 MED ORDER — METHYLPREDNISOLONE ACETATE 80 MG/ML IJ SUSP
80.0000 mg | Freq: Once | INTRAMUSCULAR | Status: AC
Start: 2023-09-22 — End: 2023-09-22
  Administered 2023-09-22: 80 mg via INTRAMUSCULAR

## 2023-09-22 NOTE — Patient Instructions (Addendum)
Severe  Persistent asthma: with acute exacerbation Duoneb treatment given in clinic x 2, symptoms improved after treatment 80 mg depomedrol Tomorrow start prednisone 40 mg daily by mouth for 4 days. Labs today for biologics. Breztri samples provided Strong recommendation to start injectable asthma medication pending blood work. Continue albuterol 4-6 puffs every 4 hours while awake for the next 2-3 days.  PLAN:  - Spacer use reviewed. - Controller Inhaler: Continue Breztri 2 puffs twice a day; This Should Be Used Everyday - Rinse mouth out after use - Rescue Inhaler: Albuterol (Proair/Ventolin) 2 puffs . Use  every 4-6 hours as needed for chest tightness, wheezing, or coughing.  Can also use 15 minutes prior to exercise if you have symptoms with activity. - Asthma is not controlled if:  - Symptoms are occurring >2 times a week OR  - >2 times a month nighttime awakenings  - You are requiring systemic steroids (prednisone/steroid injections) more than once per year  - Your require hospitalization for your asthma.  - Please call the clinic to schedule a follow up if these symptoms arise   Follow up: 3 months, sooner if needed.   Thank you so much for letting me partake in your care today.  Don't hesitate to reach out if you have any additional concerns!  Tonny Bollman, MD [facility]

## 2023-09-22 NOTE — Progress Notes (Signed)
FOLLOW UP Date of Service/Encounter:  09/22/23  Subjective:  Julia Morgan (DOB: 1991/06/09) is a 33 y.o. female who [facility] in re-evaluation of the following: asthma History obtained from: chart review and patient.  For Review, LV was on 04/03/23  with Dr.Canden Cieslinski seen for routine follow-up. See below for summary of history and diagnostics.   Therapeutic plans/changes recommended: asthma controlled on Breztri, prefers staying on this prior to starting a biologic. ----------------------------------------------------- Pertinent History/Diagnostics:  Asthma, severe persistent: Most Recent Chest Imaging: CXR on (08/07/22): No radiographic evidence of acute cardiopulmonary disease.  - CTPA 08/01/22: Cardiovascular:No evidence of pulmonary embolism. Normal heart size. No pericardial effusion. Mediastinum/Nodes: No enlarged mediastinal, hilar, or axillary lymph nodes. Thyroid gland, trachea, and esophagus demonstrate no significant findings.  Lungs/Pleura: Lungs are clear. No pleural effusion or pneumothorax. Upper Abdomen: No acute abnormality. Musculoskeletal: No chest wall abnormality. No acute or significant osseous findings.  2024: 2 ED visits, 0 UC visits and 3 oral steroids in the past year - 0 number of lifetime hospitalizations, 0 number of lifetime intubations - - Most Recent AEC (03/18/22): 0 - SPT 11/01/22: negative to all environmentals  - Normal Spirometry (11/01/22): FEV1 2.19L, 79% predicted --------------------------------------------------- Today presents for follow-up. Discussed the use of AI scribe software for clinical note transcription with the patient, who gave verbal consent to proceed.  History of Present Illness   The patient, with a known history of asthma, reports a recent exacerbation of symptoms after running out of her Breztri inhaler two days ago. She describes experiencing coughing fits and difficulty speaking without  coughing or feeling short of breath. The patient notes that the Markus Daft has been effective in managing her asthma symptoms, but when she misses a dose, her symptoms rapidly worsen.  The patient also reports a previous significant gap in her medical care due to insurance issues, during which her asthma symptoms became severe, resulting in a visit to the clinic where she received a steroid shot and a breathing treatment. Since then, she has been managing her asthma with Breztri and occasional use of a rescue inhaler, approximately once or twice a day.  The patient also has a skin condition, hidradenitis, for which she is taking doxycycline. She denies any other allergies. She previously took Singulair, but stopped as she did not notice a significant difference in her symptoms while on [place].  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 She expresses a desire to better manage her asthma and is open to the suggestion of injectable asthma medications.       All medications reviewed by clinical staff and updated in chart. No new pertinent medical or surgical history except as noted in HPI.  ROS: All others negative except as noted per HPI.   Objective:  BP 116/78   Pulse 94   Temp (!) 97.5 F (36.4 C) (Temporal)   Resp 19   Wt 134 lb 12.8 oz (61.1 kg)   SpO2 98%   BMI 21.76 kg/m  Body mass index is 21.76 kg/m. Physical Exam: General Appearance:  Alert, cooperative, in respiratory distress, talking staccatto due to shortness of breath, sweating, appears stated age  Head:  Normocephalic, without obvious abnormality, atraumatic  Eyes:  Conjunctiva clear, EOM's intact  Ears EACs normal bilaterally and normal TMs bilaterally  Nose: Nares normal, hypertrophic turbinates, normal mucosa, and  no visible anterior polyps  Throat: Lips, tongue normal; teeth and gums normal, normal  posterior oropharynx  Neck: Supple, symmetrical  Lungs:   No wheezing, but limited air movement throughout , Respirations unlabored, no coughing  Heart:  regular rate and rhythm and no murmur, Appears well perfused  Extremities: No edema  Skin: Skin color, texture, turgor normal and no rashes or lesions on visualized portions of skin  Neurologic: No gross deficits   Labs:  Lab Orders         CBC with Differential/Platelet         IgE      Spirometry:  Tracings reviewed. Her effort: Good reproducible efforts. FVC: 2.70L FEV1: 2.25L, 78% predicted FEV1/FVC ratio: 0.83 Interpretation: Nonobstructive ratio, low FEV1, possible restriction.  Please see scanned spirometry results for details.  Assessment/Plan   Patient showing signs of respiratory distress on initial exam including difficulty speaking in full sentences, sweating, tachypnea.  After receiving 80 mg of Depo-Medrol, 2 DuoNebs back-to-back, significant improvement in air movement.  Able to auscultate exhalations in upper and middle lobes.  Lower lobe still tight.  Able to talk in full sentences.  We did discuss need for a better preventative plan since she is still using albuterol twice a day while on Breztri.  She is open to this idea still labs are obtained today.  Severe  Persistent asthma: with acute exacerbation Duoneb treatment given in clinic x 2, symptoms improved after treatment 80 mg depomedrol Tomorrow start prednisone 40 mg daily by mouth for 4 days. Labs today for biologics. Breztri samples provided Strong recommendation to start injectable asthma medication pending blood work. Continue albuterol 4-6 puffs every 4 hours while awake for the next 2-3 days.  PLAN:  - Spacer use reviewed. - Controller Inhaler: Continue Breztri 2 puffs twice a day; This Should Be Used Everyday - Rinse mouth out after use - Rescue Inhaler: Albuterol (Proair/Ventolin) 2 puffs . Use  every 4-6 hours as needed for chest  tightness, wheezing, or coughing.  Can also use 15 minutes prior to exercise if you have symptoms with activity. - Asthma is not controlled if:  - Symptoms are occurring >2 times a week OR  - >2 times a month nighttime awakenings  - You are requiring systemic steroids (prednisone/steroid injections) more than once per year  - Your require hospitalization for your asthma.  - Please call the clinic to schedule a follow up if these symptoms arise   Follow up: 3 months, sooner if needed.   Thank you so much for letting me partake in your care today.  Don't hesitate to reach out if you have any additional concerns!  Other: samples provided of Louis Matte, MD  [facility]

## 2023-09-26 LAB — CBC WITH DIFFERENTIAL/PLATELET
Basophils Absolute: 0.1 10*3/uL (ref 0.0–0.2)
Basos: 1 %
EOS (ABSOLUTE): 0.6 10*3/uL — ABNORMAL HIGH (ref 0.0–0.4)
Eos: 9 %
Hematocrit: 41.7 % (ref 34.0–46.6)
Hemoglobin: 14.2 g/dL (ref 11.1–15.9)
Immature Grans (Abs): 0 10*3/uL (ref 0.0–0.1)
Immature Granulocytes: 0 %
Lymphocytes Absolute: 2.9 10*3/uL (ref 0.7–3.1)
Lymphs: 39 %
MCH: 28.2 pg (ref 26.6–33.0)
MCHC: 34.1 g/dL (ref 31.5–35.7)
MCV: 83 fL (ref 79–97)
Monocytes Absolute: 0.5 10*3/uL (ref 0.1–0.9)
Monocytes: 7 %
Neutrophils Absolute: 3.3 10*3/uL (ref 1.4–7.0)
Neutrophils: 44 %
Platelets: 388 10*3/uL (ref 150–450)
RBC: 5.03 x10E6/uL (ref 3.77–5.28)
RDW: 13 % (ref 11.7–15.4)
WBC: 7.5 10*3/uL (ref 3.4–10.8)

## 2023-09-26 LAB — IGE: IgE (Immunoglobulin E), Serum: 200 [IU]/mL (ref 6–495)

## 2023-09-27 NOTE — Progress Notes (Signed)
Please let Julia Morgan know that based on her labs, she is a candidate for multiple asthma biologics (injectable asthma meds). I would recommend Tezspire for her which targets multiple potential triggers for asthma.  It is a once per month injection with good safety profile. If she is interested, I will start the paperwork for her.

## 2023-10-16 ENCOUNTER — Other Ambulatory Visit (HOSPITAL_COMMUNITY): Payer: Self-pay

## 2023-12-20 ENCOUNTER — Ambulatory Visit: Payer: BC Managed Care – PPO | Admitting: Internal Medicine

## 2023-12-20 DIAGNOSIS — J309 Allergic rhinitis, unspecified: Secondary | ICD-10-CM

## 2024-05-02 ENCOUNTER — Encounter (HOSPITAL_BASED_OUTPATIENT_CLINIC_OR_DEPARTMENT_OTHER): Payer: Self-pay | Admitting: Emergency Medicine

## 2024-05-02 ENCOUNTER — Emergency Department (HOSPITAL_BASED_OUTPATIENT_CLINIC_OR_DEPARTMENT_OTHER)
Admission: EM | Admit: 2024-05-02 | Discharge: 2024-05-02 | Disposition: A | Discharge status: Home / Self Care | Payer: Self-pay | Attending: Emergency Medicine | Admitting: Emergency Medicine

## 2024-05-02 ENCOUNTER — Other Ambulatory Visit: Payer: Self-pay

## 2024-05-02 ENCOUNTER — Other Ambulatory Visit (HOSPITAL_BASED_OUTPATIENT_CLINIC_OR_DEPARTMENT_OTHER): Payer: Self-pay

## 2024-05-02 DIAGNOSIS — E876 Hypokalemia: Secondary | ICD-10-CM | POA: Insufficient documentation

## 2024-05-02 DIAGNOSIS — J45901 Unspecified asthma with (acute) exacerbation: Secondary | ICD-10-CM | POA: Insufficient documentation

## 2024-05-02 LAB — CBC WITH DIFFERENTIAL/PLATELET
Abs Immature Granulocytes: 0.01 K/uL (ref 0.00–0.07)
Basophils Absolute: 0.1 K/uL (ref 0.0–0.1)
Basophils Relative: 2 %
Eosinophils Absolute: 1 K/uL — ABNORMAL HIGH (ref 0.0–0.5)
Eosinophils Relative: 14 %
HCT: 41.5 % (ref 36.0–46.0)
Hemoglobin: 15 g/dL (ref 12.0–15.0)
Immature Granulocytes: 0 %
Lymphocytes Relative: 33 %
Lymphs Abs: 2.3 K/uL (ref 0.7–4.0)
MCH: 29.4 pg (ref 26.0–34.0)
MCHC: 36.1 g/dL — ABNORMAL HIGH (ref 30.0–36.0)
MCV: 81.4 fL (ref 80.0–100.0)
Monocytes Absolute: 0.7 K/uL (ref 0.1–1.0)
Monocytes Relative: 10 %
Neutro Abs: 2.9 K/uL (ref 1.7–7.7)
Neutrophils Relative %: 41 %
Platelets: 391 K/uL (ref 150–400)
RBC: 5.1 MIL/uL (ref 3.87–5.11)
RDW: 13.6 % (ref 11.5–15.5)
WBC: 6.9 K/uL (ref 4.0–10.5)
nRBC: 0 % (ref 0.0–0.2)

## 2024-05-02 LAB — RESP PANEL BY RT-PCR (RSV, FLU A&B, COVID)  RVPGX2
Influenza A by PCR: NEGATIVE
Influenza B by PCR: NEGATIVE
Resp Syncytial Virus by PCR: NEGATIVE
SARS Coronavirus 2 by RT PCR: NEGATIVE

## 2024-05-02 LAB — BASIC METABOLIC PANEL WITH GFR
Anion gap: 14 (ref 5–15)
BUN: <5 mg/dL — ABNORMAL LOW (ref 6–20)
CO2: 26 mmol/L (ref 22–32)
Calcium: 9 mg/dL (ref 8.9–10.3)
Chloride: 100 mmol/L (ref 98–111)
Creatinine, Ser: 0.69 mg/dL (ref 0.44–1.00)
GFR, Estimated: >60 mL/min (ref >60)
Glucose, Bld: 86 mg/dL (ref 70–99)
Potassium: 3.3 mmol/L — ABNORMAL LOW (ref 3.5–5.1)
Sodium: 140 mmol/L (ref 135–145)

## 2024-05-02 MED ORDER — METHYLPREDNISOLONE SODIUM SUCC 125 MG IJ SOLR
125.0000 mg | Freq: Once | INTRAMUSCULAR | Status: AC
  Administered 2024-05-02: 125 mg via INTRAVENOUS
  Filled 2024-05-02: qty 2

## 2024-05-02 MED ORDER — ALBUTEROL SULFATE (2.5 MG/3ML) 0.083% IN NEBU
INHALATION_SOLUTION | RESPIRATORY_TRACT | Status: AC
  Filled 2024-05-02: qty 12

## 2024-05-02 MED ORDER — MAGNESIUM SULFATE 2 GM/50ML IV SOLN
2.0000 g | Freq: Once | INTRAVENOUS | Status: AC
  Administered 2024-05-02: 2 g via INTRAVENOUS
  Filled 2024-05-02: qty 50

## 2024-05-02 MED ORDER — ALBUTEROL (5 MG/ML) CONTINUOUS INHALATION SOLN
10.0000 mg/h | INHALATION_SOLUTION | RESPIRATORY_TRACT | Status: DC
  Administered 2024-05-02: 10 mg/h via RESPIRATORY_TRACT
  Filled 2024-05-02: qty 20

## 2024-05-02 MED ORDER — PREDNISONE 10 MG PO TABS
60.0000 mg | ORAL_TABLET | Freq: Every day | ORAL | 0 refills | Status: AC
  Filled 2024-05-02: qty 30, 5d supply, fill #0

## 2024-05-02 MED ORDER — ONDANSETRON HCL 4 MG/2ML IJ SOLN: 4.0000 mg | Freq: Once | INTRAMUSCULAR | Status: DC

## 2024-05-02 MED ORDER — IPRATROPIUM-ALBUTEROL 0.5-2.5 (3) MG/3ML IN SOLN
3.0000 mL | Freq: Once | RESPIRATORY_TRACT | Status: AC
  Administered 2024-05-02: 3 mL via RESPIRATORY_TRACT
  Filled 2024-05-02: qty 3

## 2024-05-02 MED ORDER — ALBUTEROL SULFATE HFA 108 (90 BASE) MCG/ACT IN AERS
2.0000 | INHALATION_SPRAY | Freq: Once | RESPIRATORY_TRACT | Status: AC
  Administered 2024-05-02: 2 via RESPIRATORY_TRACT
  Filled 2024-05-02: qty 6.7

## 2024-05-02 MED ORDER — ALBUTEROL (5 MG/ML) CONTINUOUS INHALATION SOLN
10.0000 mg/h | INHALATION_SOLUTION | RESPIRATORY_TRACT | Status: DC
  Filled 2024-05-02: qty 20

## 2024-05-02 NOTE — Discharge Instructions (Addendum)
 Please follow-up with your primary care clinic in 2 or 3 days to reassess your symptoms.  I would recommend taking 2 puffs of your albuterol  inhaler every 4 hours while awake for the next 2 days.  After that you can return to using it as needed.

## 2024-05-02 NOTE — ED Triage Notes (Signed)
 Pt states Feels like Asthma attack Hx of same. X 4 days but worse today.

## 2024-05-02 NOTE — ED Provider Notes (Signed)
 [place] [facility] Provider Note   CSN: [id number] Arrival date & time: 05/02/24  9354     Patient presents with: Shortness of Breath   Julia Morgan is a 33 y.o. female with history of asthma presented to ED with 4 days of worsening shortness of breath.  Patient denies any fevers, chills, flulike symptoms.  She has been using her home medications from her pulmonologist with little improvement.  She reports she has not been on steroids in the past month.   HPI     Prior to Admission medications   Medication Sig Start Date End Date Taking? Authorizing Provider  predniSONE  (DELTASONE ) 10 MG tablet Take 6 tablets (60 mg total) by mouth daily with breakfast for 5 days. 05/03/24 05/08/24 Yes Ashana Tullo, Donnice PARAS, MD  albuterol  (VENTOLIN  HFA) [phone] Base) MCG/ACT inhaler Inhale 2 puffs into the lungs every 4 (four) hours as needed for wheezing or shortness of breath. 09/22/23   Marinda Rocky SAILOR, MD  Budeson-Glycopyrrol-Formoterol (BREZTRI  AEROSPHERE) 160-9-4.8 MCG/ACT AERO Inhale 1 puff into the lungs 2 (two) times daily. 09/22/23   Marinda Rocky SAILOR, MD  clindamycin  (CLEOCIN ) 300 MG capsule Take 1 capsule (300 mg total) by mouth 3 (three) times daily. Patient not taking: Reported on 09/22/2023 06/05/23   Armenta Canning, MD  doxycycline  (VIBRAMYCIN ) 100 MG capsule Take 1 capsule (100 mg total) by mouth 2 (two) times daily. Patient not taking: Reported on 09/22/2023 06/08/23   Erik Kieth BROCKS, MD  ibuprofen  (ADVIL ) 800 MG tablet Take 1 tablet (800 mg total) by mouth every 8 (eight) hours. 06/08/23   Erik Kieth BROCKS, MD  montelukast  (SINGULAIR ) 10 MG tablet Take 1 tablet (10 mg total) by mouth daily. 03/26/23   Lorin Norris, MD  oxyCODONE  (ROXICODONE ) 5 MG immediate release tablet Take 1 tablet (5 mg total) by mouth every 4 (four) hours as needed for severe pain. Patient not taking: Reported on 09/22/2023 06/08/23   Erik Kieth BROCKS, MD    Allergies:  Patient has no known allergies.    Review of Systems  Updated Vital Signs BP 122/86 (BP Location: Right Arm)   Pulse 100   Temp 99.1 F (37.3 C) (Oral)   Resp 18   LMP 04/11/2024 (Exact Date)   SpO2 99%   Physical Exam Constitutional:      General: She is not in acute distress. HENT:     Head: Normocephalic and atraumatic.  Eyes:     Conjunctiva/sclera: Conjunctivae normal.     Pupils: Pupils are equal, round, and reactive to light.  Cardiovascular:     Rate and Rhythm: Regular rhythm. Tachycardia present.  Pulmonary:     Effort: Pulmonary effort is normal. No respiratory distress.     Comments: Voice is hoarse, patient has mild tachypnea, respiratory rate 26, no hypoxia, she has diffuse inspiratory and expiratory wheezing Abdominal:     General: There is no distension.     Tenderness: There is no abdominal tenderness.  Skin:    General: Skin is warm and dry.  Neurological:     General: No focal deficit present.     Mental Status: She is alert. Mental status is at baseline.  Psychiatric:        Mood and Affect: Mood normal.        Behavior: Behavior normal.     (all labs ordered are listed, but only abnormal results are displayed) Labs Reviewed  BASIC METABOLIC PANEL WITH GFR - Abnormal; Notable for  the following components:      Result Value   Potassium 3.3 (*)    BUN <5 (*)    All other components within normal limits  CBC WITH DIFFERENTIAL/PLATELET - Abnormal; Notable for the following components:   MCHC 36.1 (*)    Eosinophils Absolute 1.0 (*)    All other components within normal limits  RESP PANEL BY RT-PCR (RSV, FLU A&B, COVID)  RVPGX2  PREGNANCY, URINE    EKG: None  Radiology: No results found.   .Critical Care  Performed by: Cottie Donnice PARAS, MD Authorized by: Cottie Donnice PARAS, MD   Critical care provider statement:    Critical care time (minutes):  30   Critical care time was exclusive of:  Separately billable procedures and treating  other patients   Critical care was necessary to treat or prevent imminent or life-threatening deterioration of the following conditions:  Respiratory failure   Critical care was time spent personally by me on the following activities:  Ordering and performing treatments and interventions, ordering and review of laboratory studies, ordering and review of radiographic studies, pulse oximetry, review of old charts, examination of patient and evaluation of patient's response to treatment Comments:     Asthma exacerbation with tachypnea requiring continuous nebulizers, breathing reassessment, IV magnesium  and steroids.    Medications Ordered in the ED  albuterol  (PROVENTIL ,VENTOLIN ) solution continuous neb (0 mg/hr Nebulization Stopped 05/02/24 0826)  albuterol  (PROVENTIL ) (2.5 MG/3ML) 0.083% nebulizer solution (  Not Given 05/02/24 0737)  ipratropium-albuterol  (DUONEB) 0.5-2.5 (3) MG/3ML nebulizer solution 3 mL (3 mLs Nebulization Given 05/02/24 0710)  albuterol  (VENTOLIN  HFA) 108 (90 Base) MCG/ACT inhaler 2 puff (2 puffs Inhalation Given 05/02/24 0848)  methylPREDNISolone  sodium succinate (SOLU-MEDROL ) 125 mg/2 mL injection 125 mg (125 mg Intravenous Given 05/02/24 0734)  magnesium  sulfate IVPB 2 g 50 mL (0 g Intravenous Stopped 05/02/24 0838)    Clinical Course as of 05/02/24 0852  Thu May 02, 2024  0851 Significant improvement of the patient's symptoms, wheezing is extremely faint and nearly gone on reauscultation.  Patient is feeling much better.  Stable for discharge [MT]    Clinical Course User Index [MT] Wanita Derenzo, Donnice PARAS, MD                                 Medical Decision Making Amount and/or Complexity of Data Reviewed Labs: ordered.  Risk Prescription drug management.   Patient is presenting with suspected asthma exacerbation.  No clear infectious symptoms to raise concern at this time for bacterial pneumonia or sepsis.  Doubt congestive heart failure.  No acute risk factors for PE  and low clinical suspicion for this.  No indication for CT angiogram imaging at this time or xray of the chest. Doubt PTX.  I reviewed the patient's external medical records. Patient on Breztri  at home regularly, albuterol  as needed, per allergy  center evaluation.  CT PE study in 2023 unremarkable.  I reviewed the patient's workup here in the ED today, notable for no emergent findings.  She has very mild hypokalemia in the setting of albuterol  use.  Patient was given IV magnesium , IV steroids, a DuoNeb and continuous nebulizers for suspected reactive airway disease and asthma.  On reassessment she has had significant improvement of her symptoms.  I believe she is stable for discharge        Final diagnoses:  Exacerbation of asthma, unspecified asthma severity, unspecified whether persistent  ED Discharge Orders          Ordered    predniSONE  (DELTASONE ) 10 MG tablet  Daily with breakfast        05/02/24 0851               Cottie Donnice PARAS, MD 05/02/24 [phone]

## 2024-05-18 ENCOUNTER — Emergency Department (HOSPITAL_BASED_OUTPATIENT_CLINIC_OR_DEPARTMENT_OTHER)
Admission: EM | Admit: 2024-05-18 | Discharge: 2024-05-18 | Disposition: A | Discharge status: Home / Self Care | Payer: Self-pay | Attending: Emergency Medicine | Admitting: Emergency Medicine

## 2024-05-18 ENCOUNTER — Other Ambulatory Visit: Payer: Self-pay

## 2024-05-18 ENCOUNTER — Encounter (HOSPITAL_BASED_OUTPATIENT_CLINIC_OR_DEPARTMENT_OTHER): Payer: Self-pay | Admitting: Emergency Medicine

## 2024-05-18 ENCOUNTER — Emergency Department (HOSPITAL_BASED_OUTPATIENT_CLINIC_OR_DEPARTMENT_OTHER): Payer: Self-pay

## 2024-05-18 DIAGNOSIS — R9431 Abnormal electrocardiogram [ECG] [EKG]: Secondary | ICD-10-CM

## 2024-05-18 DIAGNOSIS — I4581 Long QT syndrome: Secondary | ICD-10-CM | POA: Insufficient documentation

## 2024-05-18 DIAGNOSIS — J4531 Mild persistent asthma with (acute) exacerbation: Secondary | ICD-10-CM | POA: Insufficient documentation

## 2024-05-18 DIAGNOSIS — U071 COVID-19: Secondary | ICD-10-CM | POA: Insufficient documentation

## 2024-05-18 LAB — TROPONIN T, HIGH SENSITIVITY: Troponin T High Sensitivity: <15 ng/L (ref 0–19)

## 2024-05-18 LAB — CBC WITH DIFFERENTIAL/PLATELET
Abs Immature Granulocytes: 0.02 K/uL (ref 0.00–0.07)
Basophils Absolute: 0.1 K/uL (ref 0.0–0.1)
Basophils Relative: 2 %
Eosinophils Absolute: 0.9 K/uL — ABNORMAL HIGH (ref 0.0–0.5)
Eosinophils Relative: 12 %
HCT: 41.5 % (ref 36.0–46.0)
Hemoglobin: 15 g/dL (ref 12.0–15.0)
Immature Granulocytes: 0 %
Lymphocytes Relative: 11 %
Lymphs Abs: 0.8 K/uL (ref 0.7–4.0)
MCH: 28.7 pg (ref 26.0–34.0)
MCHC: 36.1 g/dL — ABNORMAL HIGH (ref 30.0–36.0)
MCV: 79.5 fL — ABNORMAL LOW (ref 80.0–100.0)
Monocytes Absolute: 0.8 K/uL (ref 0.1–1.0)
Monocytes Relative: 11 %
Neutro Abs: 4.8 K/uL (ref 1.7–7.7)
Neutrophils Relative %: 64 %
Platelets: 307 K/uL (ref 150–400)
RBC: 5.22 MIL/uL — ABNORMAL HIGH (ref 3.87–5.11)
RDW: 13.2 % (ref 11.5–15.5)
WBC: 7.4 K/uL (ref 4.0–10.5)
nRBC: 0 % (ref 0.0–0.2)

## 2024-05-18 LAB — COMPREHENSIVE METABOLIC PANEL WITH GFR
ALT: 39 U/L (ref 0–44)
AST: 72 U/L — ABNORMAL HIGH (ref 15–41)
Albumin: 4.4 g/dL (ref 3.5–5.0)
Alkaline Phosphatase: 122 U/L (ref 38–126)
Anion gap: 17 — ABNORMAL HIGH (ref 5–15)
BUN: <5 mg/dL — ABNORMAL LOW (ref 6–20)
CO2: 20 mmol/L — ABNORMAL LOW (ref 22–32)
Calcium: 9.1 mg/dL (ref 8.9–10.3)
Chloride: 98 mmol/L (ref 98–111)
Creatinine, Ser: 0.69 mg/dL (ref 0.44–1.00)
GFR, Estimated: >60 mL/min (ref >60)
Glucose, Bld: 91 mg/dL (ref 70–99)
Potassium: 3.4 mmol/L — ABNORMAL LOW (ref 3.5–5.1)
Sodium: 135 mmol/L (ref 135–145)
Total Bilirubin: 0.4 mg/dL (ref 0.0–1.2)
Total Protein: 8.3 g/dL — ABNORMAL HIGH (ref 6.5–8.1)

## 2024-05-18 LAB — RESP PANEL BY RT-PCR (RSV, FLU A&B, COVID)  RVPGX2
Influenza A by PCR: NEGATIVE
Influenza B by PCR: NEGATIVE
Resp Syncytial Virus by PCR: NEGATIVE
SARS Coronavirus 2 by RT PCR: POSITIVE — AB

## 2024-05-18 LAB — HCG, SERUM, QUALITATIVE: Preg, Serum: NEGATIVE

## 2024-05-18 MED ORDER — ALBUTEROL SULFATE (2.5 MG/3ML) 0.083% IN NEBU
10.0000 mg | INHALATION_SOLUTION | Freq: Once | RESPIRATORY_TRACT | Status: AC
  Administered 2024-05-18: 10 mg via RESPIRATORY_TRACT
  Filled 2024-05-18: qty 12

## 2024-05-18 MED ORDER — METHYLPREDNISOLONE SODIUM SUCC 125 MG IJ SOLR
125.0000 mg | Freq: Once | INTRAMUSCULAR | Status: AC
  Administered 2024-05-18: 125 mg via INTRAVENOUS
  Filled 2024-05-18: qty 2

## 2024-05-18 MED ORDER — POTASSIUM CHLORIDE CRYS ER 20 MEQ PO TBCR
40.0000 meq | EXTENDED_RELEASE_TABLET | Freq: Once | ORAL | Status: AC
Start: 2024-05-18 — End: 2024-05-18
  Administered 2024-05-18: 40 meq via ORAL
  Filled 2024-05-18: qty 2

## 2024-05-18 MED ORDER — IPRATROPIUM BROMIDE 0.02 % IN SOLN
0.5000 mg | Freq: Once | RESPIRATORY_TRACT | Status: AC
  Administered 2024-05-18: 0.5 mg via RESPIRATORY_TRACT
  Filled 2024-05-18: qty 2.5

## 2024-05-18 MED ORDER — PREDNISONE 10 MG PO TABS: 40.0000 mg | ORAL_TABLET | Freq: Every day | ORAL | 0 refills | Status: AC

## 2024-05-18 MED ORDER — ALBUTEROL SULFATE HFA 108 (90 BASE) MCG/ACT IN AERS
2.0000 | INHALATION_SPRAY | Freq: Once | RESPIRATORY_TRACT | Status: AC
  Administered 2024-05-18: 2 via RESPIRATORY_TRACT
  Filled 2024-05-18: qty 6.7

## 2024-05-18 MED ORDER — LACTATED RINGERS IV BOLUS
1000.0000 mL | Freq: Once | INTRAVENOUS | Status: AC
  Administered 2024-05-18: 1000 mL via INTRAVENOUS

## 2024-05-18 MED ORDER — IPRATROPIUM-ALBUTEROL 0.5-2.5 (3) MG/3ML IN SOLN
3.0000 mL | Freq: Once | RESPIRATORY_TRACT | Status: AC
  Administered 2024-05-18: 3 mL via RESPIRATORY_TRACT
  Filled 2024-05-18: qty 3

## 2024-05-18 MED ORDER — BENZONATATE 100 MG PO CAPS: 100.0000 mg | ORAL_CAPSULE | Freq: Three times a day (TID) | ORAL | 0 refills | Status: AC

## 2024-05-18 MED ORDER — ALBUTEROL SULFATE (2.5 MG/3ML) 0.083% IN NEBU
2.5000 mg | INHALATION_SOLUTION | Freq: Once | RESPIRATORY_TRACT | Status: AC
  Administered 2024-05-18: 2.5 mg via RESPIRATORY_TRACT
  Filled 2024-05-18: qty 3

## 2024-05-18 MED ORDER — MAGNESIUM SULFATE 2 GM/50ML IV SOLN
2.0000 g | Freq: Once | INTRAVENOUS | Status: AC
  Administered 2024-05-18: 2 g via INTRAVENOUS
  Filled 2024-05-18: qty 50

## 2024-05-18 MED ORDER — FLUTICASONE PROPIONATE HFA 44 MCG/ACT IN AERO
2.0000 | INHALATION_SPRAY | Freq: Once | RESPIRATORY_TRACT | Status: AC
  Administered 2024-05-18: 2 via RESPIRATORY_TRACT
  Filled 2024-05-18: qty 10.6

## 2024-05-18 NOTE — ED Triage Notes (Signed)
 States was run out of her albuterol  inhaler and it SOB, with chest tightness

## 2024-05-18 NOTE — ED Provider Notes (Signed)
 [place] [facility] Provider Note   CSN: [id number] Arrival date & time: 05/18/24  9267     Patient presents with: Shortness of Breath   Julia Morgan is a 33 y.o. female.   HPI      33 year old female with a history of asthma, anemia, who presents with concern for cough and shortness of breath.  Reports her asthma has been acting up since Monday.  She has been unable to take her Breztri  for the past 3 months due to insurance issues.  She does have albuterol  at home. SHe was trying to take this for her symptoms but she continued to feel worse and decdied to come in.  [facility] has been having a cough with clear sputum. No congestion or sore throat. Has mild runny nose she attributes to allergies. No leg pain or swelling. Has chest pain that she thinks is from coughing and wheezing.  Has coughed to point of emesis, no other nausea or vomiting. No diarrhea, no black or bloody stools. No fevers.   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 Authorizing Provider  benzonatate  (TESSALON ) 100 MG capsule Take 1 capsule (100 mg total) by mouth every 8 (eight) hours. 05/18/24  Yes Dreama Longs, MD  predniSONE  (DELTASONE ) 10 MG tablet Take 4 tablets (40 mg total) by mouth daily for 4 days. 05/18/24 05/22/24 Yes Dreama Longs, MD  albuterol  (VENTOLIN  HFA) 108 (90 Base) MCG/ACT inhaler Inhale 2 puffs into the lungs every 4 (four) hours as needed for wheezing or shortness of breath. 09/22/23   Marinda Longs SAILOR, MD  Budeson-Glycopyrrol-Formoterol (BREZTRI  AEROSPHERE) 160-9-4.8 MCG/ACT AERO Inhale 1 puff into the lungs 2 (two) times daily. 09/22/23   Marinda Longs SAILOR,  MD  clindamycin  (CLEOCIN ) 300 MG capsule Take 1 capsule (300 mg total) by mouth 3 (three) times daily. Patient not taking: Reported on 09/22/2023 06/05/23   Armenta Canning, MD  doxycycline  (VIBRAMYCIN ) 100 MG capsule Take 1 capsule (100 mg total) by mouth 2 (two) times daily. Patient not taking: Reported on 09/22/2023 06/08/23   Erik Kieth BROCKS, MD  ibuprofen  (ADVIL ) 800 MG tablet Take 1 tablet (800 mg total) by mouth every 8 (eight) hours. 06/08/23   Erik Kieth BROCKS, MD  montelukast  (SINGULAIR ) 10 MG tablet Take 1 tablet (10 mg total) by mouth daily. 03/26/23   Lorin Norris, MD  oxyCODONE  (ROXICODONE ) 5 MG immediate release tablet Take 1 tablet (5 mg total) by mouth every 4 (four) hours as needed for severe pain. Patient not taking: Reported on 09/22/2023 06/08/23   Erik Kieth BROCKS, MD    Allergies: Patient has no known allergies.    Review of Systems  Updated Vital Signs BP 133/77   Pulse (!) 118   Temp 99.6 F (37.6 C)   Resp 19   LMP 04/11/2024 (Exact Date)   SpO2 97%   Physical Exam Vitals and nursing note reviewed.  Constitutional:      General: She is not in acute distress.    Appearance: She is well-developed. She is not diaphoretic.  HENT:  Head: Normocephalic and atraumatic.  Eyes:     Conjunctiva/sclera: Conjunctivae normal.  Cardiovascular:     Rate and Rhythm: Normal rate and regular rhythm.     Heart sounds: Normal heart sounds. No murmur heard.    No friction rub. No gallop.  Pulmonary:     Effort: Pulmonary effort is normal. Tachypnea (speaking in short sentences) present. No respiratory distress.     Breath sounds: Wheezing present. No rales.  Abdominal:     General: There is no distension.     Palpations: Abdomen is soft.     Tenderness: There is no abdominal tenderness. There is no guarding.  Musculoskeletal:        General: No tenderness.     Cervical back: Normal range of motion.  Skin:    General: Skin is warm and dry.     Findings:  No erythema or rash.  Neurological:     Mental Status: She is alert and oriented to person, place, and time.     (all labs ordered are listed, but only abnormal results are displayed) Labs Reviewed  RESP PANEL BY RT-PCR (RSV, FLU A&B, COVID)  RVPGX2 - Abnormal; Notable for the following components:      Result Value   SARS Coronavirus 2 by RT PCR POSITIVE (*)    All other components within normal limits  CBC WITH DIFFERENTIAL/PLATELET - Abnormal; Notable for the following components:   RBC 5.22 (*)    MCV 79.5 (*)    MCHC 36.1 (*)    Eosinophils Absolute 0.9 (*)    All other components within normal limits  COMPREHENSIVE METABOLIC PANEL WITH GFR - Abnormal; Notable for the following components:   Potassium 3.4 (*)    CO2 20 (*)    BUN <5 (*)    Total Protein 8.3 (*)    AST 72 (*)    Anion gap 17 (*)    All other components within normal limits  HCG, SERUM, QUALITATIVE  TROPONIN T, HIGH SENSITIVITY    EKG: EKG Interpretation Date/Time:  Saturday May 18 2024 09:22:40 EDT Ventricular Rate:  141 PR Interval:  81 QRS Duration:  91 QT Interval:  411 QTC Calculation: 630 R Axis:   86  Text Interpretation: Sinus tachycardia , less likely ectopic atrial rhythm Consider right atrial enlargement Nonspecific repol abnormality, diffuse leads Prolonged QT interval Confirmed by Dreama Longs ([id number]) on 05/18/2024 9:52:07 AM  Radiology: [facility] Chest Portable 1 View Result Date: 05/18/2024 EXAM: 1 VIEW XRAY OF THE CHEST 05/18/2024 08:18:54 AM COMPARISON: 08/07/2022 CLINICAL HISTORY: SOB. States was run out of her albuterol  inhaler and it SOB, with chest tightness. FINDINGS: LUNGS AND PLEURA: No focal pulmonary opacity. No pulmonary edema. No pleural effusion. No pneumothorax. Central airway thickening with peribronchial cuffing noted. HEART AND MEDIASTINUM: No acute abnormality of the cardiac and mediastinal silhouettes. BONES AND SOFT TISSUES: No acute osseous abnormality. IMPRESSION:  1. Central airway thickening with peribronchial cuffing. 2. No signs of pneumonia Electronically signed by: Waddell Calk MD 05/18/2024 08:24 AM EDT RP Workstation: [id number]     Procedures   Medications Ordered in the ED  ipratropium-albuterol  (DUONEB) 0.5-2.5 (3) MG/3ML nebulizer solution 3 mL (3 mLs Nebulization Given 05/18/24 0755)  albuterol  (PROVENTIL ) (2.5 MG/3ML) 0.083% nebulizer solution 2.5 mg (2.5 mg Nebulization Given 05/18/24 0755)  methylPREDNISolone  sodium succinate (SOLU-MEDROL ) 125 mg/2 mL injection 125 mg (125 mg Intravenous Given 05/18/24 0802)  albuterol  (PROVENTIL ) (2.5 MG/3ML) 0.083% nebulizer solution 10 mg (10 mg Nebulization Given 05/18/24 0825)  ipratropium (ATROVENT ) nebulizer solution 0.5 mg (0.5 mg Nebulization Given 05/18/24 0825)  fluticasone  (FLOVENT  HFA) 44 MCG/ACT inhaler 2 puff (2 puffs Inhalation Given 05/18/24 0927)  albuterol  (VENTOLIN  HFA) 108 (90 Base) MCG/ACT inhaler 2 puff (2 puffs Inhalation Given 05/18/24 0927)  magnesium  sulfate IVPB 2 g 50 mL (0 g Intravenous Stopped 05/18/24 1116)  lactated ringers  bolus 1,000 mL (0 mLs Intravenous Stopped 05/18/24 1116)  potassium chloride  SA (KLOR-CON  M) CR tablet 40 mEq (40 mEq Oral Given 05/18/24 1006)                                     33 year old female with a history of asthma, anemia, who presents with concern for cough and shortness of breath.  Differential diagnosis for dyspnea includes ACS, PE, asthma/wCOPD exacerbation, CHF exacerbation, anemia, pneumonia, viral etiology such as COVID 19 infection, metabolic abnormality.    Chest x-ray was done which showed no signs of pneumonia, pneumothorax or pulmonary edema.  It does show central airway thickening and peribronchial cuffing.SABRA   EKG was evaluated by me which showed sinus tachycardia with tw changes which may be rate related.  Labs completed and evaluated by me show no no evidence of anemia, clinically significant electrolyte abnormalities, no  leukocytosis, normal troponin and doubt ACS or myocarditis.  Her COVID test was done given cough and congestion showed no evidence of flu, RSV, but was positive for COVID-19.  Pregnancy test negative.  Clinically, suspect asthma exacerbation, possibly related to COVID-19..  Given solumedrol, albuterol /atrovent  and continuous with improvement in symptoms.  Feels improved, does have continued tachycardia.  Repeat EGD performed which is most consistent with a sinus tachycardia with prolonged QTc.  Suspect her tachycardia secondary to albuterol , dehydration.  She is given IV fluids, magnesium , and potassium for mild hypokalemia.  HR improved to the 90s on reevaluation. She is feeling improved.  Prolonged QTc today may be in setting of albuterol , mild hypokalemia-however discussed with patient and will refer to Cardiology.   Given albuterol  inhaler and flovent  to use daily in setting of being unable to use her prior daily medication.      Given prednisone , tessalon . Patient discharged in stable condition with understanding of reasons to return.          Final diagnoses:  Mild persistent asthma with exacerbation  COVID-19  Prolonged Q-T interval on ECG    ED Discharge Orders          Ordered    predniSONE  (DELTASONE ) 10 MG tablet  Daily        05/18/24 1122    benzonatate  (TESSALON ) 100 MG capsule  Every 8 hours        05/18/24 1122    Ambulatory referral to Cardiology        05/18/24 1124               Dreama Longs, MD 05/18/24 1129

## 2024-05-20 ENCOUNTER — Emergency Department (HOSPITAL_BASED_OUTPATIENT_CLINIC_OR_DEPARTMENT_OTHER): Payer: Self-pay

## 2024-05-20 ENCOUNTER — Other Ambulatory Visit: Payer: Self-pay

## 2024-05-20 ENCOUNTER — Encounter (HOSPITAL_BASED_OUTPATIENT_CLINIC_OR_DEPARTMENT_OTHER): Payer: Self-pay | Admitting: Emergency Medicine

## 2024-05-20 ENCOUNTER — Emergency Department (HOSPITAL_BASED_OUTPATIENT_CLINIC_OR_DEPARTMENT_OTHER)
Admission: EM | Admit: 2024-05-20 | Discharge: 2024-05-20 | Disposition: A | Discharge status: Home / Self Care | Payer: Self-pay | Attending: Emergency Medicine | Admitting: Emergency Medicine

## 2024-05-20 DIAGNOSIS — R079 Chest pain, unspecified: Secondary | ICD-10-CM

## 2024-05-20 DIAGNOSIS — J4531 Mild persistent asthma with (acute) exacerbation: Secondary | ICD-10-CM | POA: Insufficient documentation

## 2024-05-20 DIAGNOSIS — U071 COVID-19: Secondary | ICD-10-CM | POA: Insufficient documentation

## 2024-05-20 LAB — TROPONIN T, HIGH SENSITIVITY: Troponin T High Sensitivity: <15 ng/L (ref 0–19)

## 2024-05-20 MED ORDER — DEXAMETHASONE SODIUM PHOSPHATE 10 MG/ML IJ SOLN
10.0000 mg | Freq: Once | INTRAMUSCULAR | Status: AC
Start: 2024-05-20 — End: 2024-05-20
  Administered 2024-05-20: 10 mg via INTRAVENOUS
  Filled 2024-05-20: qty 1

## 2024-05-20 MED ORDER — ALBUTEROL SULFATE HFA 108 (90 BASE) MCG/ACT IN AERS
2.0000 | INHALATION_SPRAY | Freq: Once | RESPIRATORY_TRACT | Status: AC
  Administered 2024-05-20: 2 via RESPIRATORY_TRACT
  Filled 2024-05-20: qty 6.7

## 2024-05-20 NOTE — ED Provider Notes (Signed)
 [facility] Provider Note   CSN: [id number] Arrival date & time: 05/20/24  0701     Patient presents with: Shortness of Breath   Julia Morgan is a 33 y.o. female.   Patient presents with cough congestion and intermittent fever for approximately 3 days.  Patient's had recent asthma exacerbation and was seen for similar.  Patient was unable to fill prednisone  prescription.  Patient's had chest discomfort with coughing and mild pressure.  More significant than her normal asthma.  Patient denies any blood clot history, no recent surgery, no hemoptysis.  No cardiac history, no high blood pressure, no high cholesterol history.  The history is provided by the patient.  Shortness of Breath Associated symptoms: chest pain, cough and fever   Associated symptoms: no abdominal pain, no headaches, no neck pain, no rash and no vomiting        Prior to Admission medications   Medication Sig Start Date End Date Taking? Authorizing Provider  albuterol  (VENTOLIN  HFA) 108 (90 Base) MCG/ACT inhaler Inhale 2 puffs into the lungs every 4 (four) hours as needed for wheezing or shortness of breath. 09/22/23   Marinda Rocky SAILOR, MD  benzonatate  (TESSALON ) 100 MG capsule Take 1 capsule (100 mg total) by mouth every 8 (eight) hours. 05/18/24   Dreama Rocky, MD  Budeson-Glycopyrrol-Formoterol (BREZTRI  AEROSPHERE) 160-9-4.8 MCG/ACT AERO Inhale 1 puff into the lungs 2 (two) times daily. 09/22/23   Marinda Rocky SAILOR, MD  clindamycin  (CLEOCIN ) 300 MG capsule Take 1 capsule (300 mg total) by mouth 3 (three) times daily. Patient not taking: Reported on 09/22/2023 06/05/23   Armenta Canning, MD  doxycycline  (VIBRAMYCIN ) 100 MG capsule Take 1 capsule (100 mg total) by mouth 2 (two) times daily. Patient not taking: Reported on 09/22/2023 06/08/23   Erik Kieth BROCKS, MD  ibuprofen  (ADVIL ) 800 MG tablet Take 1 tablet (800 mg total) by mouth every 8 (eight) hours. 06/08/23    Erik Kieth BROCKS, MD  montelukast  (SINGULAIR ) 10 MG tablet Take 1 tablet (10 mg total) by mouth daily. 03/26/23   Lorin Norris, MD  oxyCODONE  (ROXICODONE ) 5 MG immediate release tablet Take 1 tablet (5 mg total) by mouth every 4 (four) hours as needed for severe pain. Patient not taking: Reported on 09/22/2023 06/08/23   Erik Kieth BROCKS, MD  predniSONE  (DELTASONE ) 10 MG tablet Take 4 tablets (40 mg total) by mouth daily for 4 days. 05/18/24 05/22/24  Dreama Rocky, MD    Allergies: Patient has no known allergies.    Review of Systems  Constitutional:  Positive for fever. Negative for chills.  HENT:  Positive for congestion.   Eyes:  Negative for visual disturbance.  Respiratory:  Positive for cough and shortness of breath.   Cardiovascular:  Positive for chest pain. Negative for leg swelling.  Gastrointestinal:  Negative for abdominal pain and vomiting.  Genitourinary:  Negative for dysuria and flank pain.  Musculoskeletal:  Negative for back pain, neck pain and neck stiffness.  Skin:  Negative for rash.  Neurological:  Negative for light-headedness and headaches.    Updated Vital Signs BP 132/86   Pulse 84   Temp (!) 100.4 F (38 C)   Resp (!) 23   Wt 59 kg   LMP 05/10/2024 (Exact Date)   SpO2 98%   BMI 20.98 kg/m   Physical Exam Vitals and nursing note reviewed.  Constitutional:      General: She is not in acute distress.    Appearance:  She is well-developed. She is not ill-appearing.  HENT:     Head: Normocephalic and atraumatic.     Mouth/Throat:     Mouth: Mucous membranes are moist.  Eyes:     General:        Right eye: No discharge.        Left eye: No discharge.     Conjunctiva/sclera: Conjunctivae normal.  Neck:     Trachea: No tracheal deviation.  Cardiovascular:     Rate and Rhythm: Normal rate and regular rhythm.     Heart sounds: No murmur heard. Pulmonary:     Effort: Pulmonary effort is normal.     Breath sounds: Normal breath sounds.   Abdominal:     General: There is no distension.     Palpations: Abdomen is soft.     Tenderness: There is no abdominal tenderness. There is no guarding.  Musculoskeletal:     Cervical back: Normal range of motion and neck supple. No rigidity.  Skin:    General: Skin is warm.     Capillary Refill: Capillary refill takes less than 2 seconds.     Findings: No rash.  Neurological:     General: No focal deficit present.     Mental Status: She is alert.     Cranial Nerves: No cranial nerve deficit.  Psychiatric:        Mood and Affect: Mood normal.     (all labs ordered are listed, but only abnormal results are displayed) Labs Reviewed  TROPONIN T, HIGH SENSITIVITY    EKG: EKG Interpretation Date/Time:  Monday May 20 2024 07:13:39 EDT Ventricular Rate:  97 PR Interval:  135 QRS Duration:  86 QT Interval:  341 QTC Calculation: 434 R Axis:   92  Text Interpretation: Sinus rhythm Right atrial enlargement Borderline right axis deviation Borderline T wave abnormalities Confirmed by Tonia Chew [id number]) on 05/20/2024 7:20:04 AM  Radiology: DG Chest 2 View Result Date: 05/20/2024 CLINICAL DATA:  fever sob EXAM: CHEST - 2 VIEW COMPARISON:  None available. FINDINGS: No focal airspace consolidation, pleural effusion, or pneumothorax. No cardiomegaly.No acute fracture or destructive lesion. IMPRESSION: No acute cardiopulmonary abnormality. Electronically Signed   By: Rogelia Myers M.D.   On: 05/20/2024 08:18     Procedures   Medications Ordered in the ED  dexamethasone  (DECADRON ) injection 10 mg (10 mg Intravenous Given 05/20/24 0743)  albuterol  (VENTOLIN  HFA) 108 (90 Base) MCG/ACT inhaler 2 puff (2 puffs Inhalation Given 05/20/24 0724)                                    Medical Decision Making Amount and/or Complexity of Data Reviewed Radiology: ordered.  Risk Prescription drug management.   Patient presents for recurrent fever as her primary concern, patient had  workup which was independently reviewed on September 13 electrolytes unremarkable, no significant anemia, normal white blood cell count, chest x-ray unremarkable and troponin negative.  Discussed this is likely from inflammation/cough/mild asthma/COVID.  No evidence of significant bacterial infection this time low-grade fever, lungs are clear.  Chest x-ray ordered independent reviewed no infiltrate.  EKG similar to previous normal heart rate no ST elevation.  Troponin obtained single since she has had this for over 4 hours and was negative.  Patient well-appearing reassessment.  Discussed supportive care work note given reasons return patient comfortable plan.     Final diagnoses:  Mild persistent asthma with  acute exacerbation  COVID-19  Acute chest pain    ED Discharge Orders     None          Tonia Chew, MD 05/20/24 [phone]

## 2024-05-20 NOTE — ED Triage Notes (Signed)
 Covid + 2 days ago , chest pain and shortness of breath , Hx asthma . Fever and chills

## 2024-05-20 NOTE — Discharge Instructions (Addendum)
 The steroid dose we gave you today will last approximately 2 days so do not take prednisone  during this time.  Use albuterol  as needed every 2-4 hours for wheezing.  Use Tylenol  every 4 hours and ibuprofen  every 6 as needed for pain or fever, you can alternate between the 2 of them approximately every 3 hours. Return for significant shortness of breath or new concerns.  Your chest x-ray did not show signs of pneumonia.
